# Patient Record
Sex: Female | Born: 1935 | Race: Black or African American | Hispanic: No | Smoking: Former smoker
Health system: Southern US, Community
[De-identification: ages and names within clinical notes are randomized; demographics above are authoritative.]

## PROBLEM LIST (undated history)

## (undated) DIAGNOSIS — E785 Hyperlipidemia, unspecified: Secondary | ICD-10-CM

## (undated) DIAGNOSIS — Z8719 Personal history of other diseases of the digestive system: Secondary | ICD-10-CM

## (undated) DIAGNOSIS — R799 Abnormal finding of blood chemistry, unspecified: Secondary | ICD-10-CM

## (undated) DIAGNOSIS — I639 Cerebral infarction, unspecified: Secondary | ICD-10-CM

## (undated) DIAGNOSIS — I10 Essential (primary) hypertension: Secondary | ICD-10-CM

## (undated) DIAGNOSIS — R739 Hyperglycemia, unspecified: Secondary | ICD-10-CM

## (undated) HISTORY — DX: Essential (primary) hypertension: I10

## (undated) HISTORY — DX: Hyperlipidemia, unspecified: E78.5

## (undated) HISTORY — PX: ABDOMINAL HYSTERECTOMY: SHX81

## (undated) HISTORY — DX: Abnormal finding of blood chemistry, unspecified: R79.9

## (undated) HISTORY — DX: Cerebral infarction, unspecified: I63.9

## (undated) HISTORY — DX: Personal history of other diseases of the digestive system: Z87.19

## (undated) HISTORY — PX: APPENDECTOMY: SHX54

## (undated) HISTORY — DX: Hyperglycemia, unspecified: R73.9

---

## 1991-04-26 HISTORY — PX: CYSTECTOMY: SUR359

## 1995-04-26 HISTORY — PX: CYSTECTOMY: SUR359

## 1999-04-16 ENCOUNTER — Encounter: Payer: Self-pay | Admitting: Family Medicine

## 1999-04-16 ENCOUNTER — Encounter: Admission: RE | Admit: 1999-04-16 | Discharge: 1999-04-16 | Payer: Self-pay | Admitting: Family Medicine

## 2000-04-24 ENCOUNTER — Encounter: Payer: Self-pay | Admitting: Family Medicine

## 2000-04-24 ENCOUNTER — Encounter: Admission: RE | Admit: 2000-04-24 | Discharge: 2000-04-24 | Payer: Self-pay | Admitting: Family Medicine

## 2000-06-13 ENCOUNTER — Ambulatory Visit (HOSPITAL_COMMUNITY): Admission: RE | Admit: 2000-06-13 | Discharge: 2000-06-13 | Payer: Self-pay | Admitting: Gastroenterology

## 2001-04-27 ENCOUNTER — Encounter: Admission: RE | Admit: 2001-04-27 | Discharge: 2001-04-27 | Payer: Self-pay | Admitting: Family Medicine

## 2001-04-27 ENCOUNTER — Encounter: Payer: Self-pay | Admitting: Family Medicine

## 2002-04-29 ENCOUNTER — Encounter: Payer: Self-pay | Admitting: Family Medicine

## 2002-04-29 ENCOUNTER — Encounter: Admission: RE | Admit: 2002-04-29 | Discharge: 2002-04-29 | Payer: Self-pay | Admitting: Family Medicine

## 2002-08-01 ENCOUNTER — Encounter: Admission: RE | Admit: 2002-08-01 | Discharge: 2002-08-01 | Payer: Self-pay | Admitting: Internal Medicine

## 2002-08-01 ENCOUNTER — Encounter: Payer: Self-pay | Admitting: Internal Medicine

## 2002-09-30 ENCOUNTER — Ambulatory Visit (HOSPITAL_COMMUNITY): Admission: RE | Admit: 2002-09-30 | Discharge: 2002-09-30 | Payer: Self-pay | Admitting: Gastroenterology

## 2003-05-02 ENCOUNTER — Encounter: Admission: RE | Admit: 2003-05-02 | Discharge: 2003-05-02 | Payer: Self-pay | Admitting: Internal Medicine

## 2003-10-18 HISTORY — PX: OTHER SURGICAL HISTORY: SHX169

## 2003-10-21 ENCOUNTER — Encounter: Admission: RE | Admit: 2003-10-21 | Discharge: 2003-10-21 | Payer: Self-pay | Admitting: Otolaryngology

## 2004-04-08 ENCOUNTER — Encounter: Payer: Self-pay | Admitting: Family Medicine

## 2004-05-07 ENCOUNTER — Encounter: Admission: RE | Admit: 2004-05-07 | Discharge: 2004-05-07 | Payer: Self-pay | Admitting: Internal Medicine

## 2004-06-02 ENCOUNTER — Ambulatory Visit (HOSPITAL_COMMUNITY): Admission: RE | Admit: 2004-06-02 | Discharge: 2004-06-02 | Payer: Self-pay | Admitting: Gastroenterology

## 2004-07-21 ENCOUNTER — Encounter: Payer: Self-pay | Admitting: Family Medicine

## 2004-07-21 HISTORY — PX: CARDIOVASCULAR STRESS TEST: SHX262

## 2005-05-17 ENCOUNTER — Encounter: Admission: RE | Admit: 2005-05-17 | Discharge: 2005-05-17 | Payer: Self-pay | Admitting: Internal Medicine

## 2005-08-01 ENCOUNTER — Encounter: Admission: RE | Admit: 2005-08-01 | Discharge: 2005-08-01 | Payer: Self-pay | Admitting: Internal Medicine

## 2006-02-17 ENCOUNTER — Ambulatory Visit: Payer: Self-pay | Admitting: Internal Medicine

## 2006-03-21 ENCOUNTER — Ambulatory Visit: Payer: Self-pay | Admitting: Internal Medicine

## 2006-03-21 ENCOUNTER — Encounter (INDEPENDENT_AMBULATORY_CARE_PROVIDER_SITE_OTHER): Payer: Self-pay | Admitting: *Deleted

## 2006-03-21 LAB — HM COLONOSCOPY: HM Colonoscopy: ABNORMAL

## 2006-03-22 ENCOUNTER — Ambulatory Visit (HOSPITAL_COMMUNITY): Admission: RE | Admit: 2006-03-22 | Discharge: 2006-03-22 | Payer: Self-pay | Admitting: Internal Medicine

## 2006-04-03 ENCOUNTER — Ambulatory Visit (HOSPITAL_COMMUNITY): Admission: RE | Admit: 2006-04-03 | Discharge: 2006-04-03 | Payer: Self-pay | Admitting: Internal Medicine

## 2006-05-17 ENCOUNTER — Encounter: Payer: Self-pay | Admitting: Family Medicine

## 2006-05-18 ENCOUNTER — Encounter: Admission: RE | Admit: 2006-05-18 | Discharge: 2006-05-18 | Payer: Self-pay | Admitting: Internal Medicine

## 2006-06-15 ENCOUNTER — Ambulatory Visit: Payer: Self-pay | Admitting: Internal Medicine

## 2006-09-08 ENCOUNTER — Ambulatory Visit: Payer: Self-pay | Admitting: Family Medicine

## 2006-09-08 DIAGNOSIS — E785 Hyperlipidemia, unspecified: Secondary | ICD-10-CM

## 2006-09-08 DIAGNOSIS — I10 Essential (primary) hypertension: Secondary | ICD-10-CM | POA: Insufficient documentation

## 2006-09-08 DIAGNOSIS — Z8719 Personal history of other diseases of the digestive system: Secondary | ICD-10-CM | POA: Insufficient documentation

## 2006-09-08 DIAGNOSIS — J329 Chronic sinusitis, unspecified: Secondary | ICD-10-CM | POA: Insufficient documentation

## 2006-09-13 LAB — CONVERTED CEMR LAB
ALT: 13 units/L (ref 0–40)
AST: 15 units/L (ref 0–37)
HDL: 36.4 mg/dL — ABNORMAL LOW (ref >39.0)
Total CHOL/HDL Ratio: 6.3
Triglycerides: 181 mg/dL — ABNORMAL HIGH (ref 0–149)

## 2006-09-15 ENCOUNTER — Encounter: Payer: Self-pay | Admitting: Family Medicine

## 2006-11-10 ENCOUNTER — Encounter: Admission: RE | Admit: 2006-11-10 | Discharge: 2006-11-10 | Payer: Self-pay | Admitting: Family Medicine

## 2006-11-10 ENCOUNTER — Ambulatory Visit: Payer: Self-pay | Admitting: Family Medicine

## 2006-12-08 ENCOUNTER — Ambulatory Visit: Payer: Self-pay | Admitting: Family Medicine

## 2007-01-05 ENCOUNTER — Ambulatory Visit: Payer: Self-pay | Admitting: Family Medicine

## 2007-01-08 LAB — CONVERTED CEMR LAB
Albumin: 3.8 g/dL (ref 3.5–5.2)
Calcium: 9.1 mg/dL (ref 8.4–10.5)
Chloride: 109 meq/L (ref 96–112)
GFR calc Af Amer: 57 mL/min
GFR calc non Af Amer: 47 mL/min
LDL Cholesterol: 89 mg/dL (ref 0–99)
Potassium: 3.6 meq/L (ref 3.5–5.1)
Sodium: 145 meq/L (ref 135–145)
Triglycerides: 108 mg/dL (ref 0–149)
VLDL: 22 mg/dL (ref 0–40)

## 2007-02-27 ENCOUNTER — Encounter: Payer: Self-pay | Admitting: Family Medicine

## 2007-04-26 DIAGNOSIS — R739 Hyperglycemia, unspecified: Secondary | ICD-10-CM

## 2007-04-26 HISTORY — DX: Hyperglycemia, unspecified: R73.9

## 2007-05-21 ENCOUNTER — Encounter: Admission: RE | Admit: 2007-05-21 | Discharge: 2007-05-21 | Payer: Self-pay | Admitting: Family Medicine

## 2007-05-24 ENCOUNTER — Encounter: Payer: Self-pay | Admitting: Family Medicine

## 2007-06-12 ENCOUNTER — Ambulatory Visit: Payer: Self-pay | Admitting: Family Medicine

## 2007-06-18 LAB — CONVERTED CEMR LAB
ALT: 17 units/L (ref 0–35)
AST: 18 units/L (ref 0–37)
Albumin: 3.7 g/dL (ref 3.5–5.2)
BUN: 15 mg/dL (ref 6–23)
CO2: 30 meq/L (ref 19–32)
Calcium: 9.4 mg/dL (ref 8.4–10.5)
Cholesterol: 159 mg/dL (ref 0–200)
GFR calc Af Amer: 52 mL/min
GFR calc non Af Amer: 43 mL/min
LDL Cholesterol: 101 mg/dL — ABNORMAL HIGH (ref 0–99)
Phosphorus: 3.1 mg/dL (ref 2.3–4.6)
Potassium: 4.1 meq/L (ref 3.5–5.1)
Total CHOL/HDL Ratio: 5.1

## 2007-07-24 ENCOUNTER — Ambulatory Visit: Payer: Self-pay | Admitting: Family Medicine

## 2007-09-10 ENCOUNTER — Ambulatory Visit: Payer: Self-pay | Admitting: Family Medicine

## 2007-09-10 DIAGNOSIS — J309 Allergic rhinitis, unspecified: Secondary | ICD-10-CM | POA: Insufficient documentation

## 2007-10-22 ENCOUNTER — Ambulatory Visit: Payer: Self-pay | Admitting: Family Medicine

## 2007-10-27 LAB — CONVERTED CEMR LAB
AST: 23 units/L (ref 0–37)
CO2: 31 meq/L (ref 19–32)
Chloride: 106 meq/L (ref 96–112)
GFR calc Af Amer: 52 mL/min
GFR calc non Af Amer: 43 mL/min
Glucose, Bld: 110 mg/dL — ABNORMAL HIGH (ref 70–99)
Potassium: 3.7 meq/L (ref 3.5–5.1)
Sodium: 142 meq/L (ref 135–145)
Total CHOL/HDL Ratio: 4.4

## 2007-10-31 ENCOUNTER — Ambulatory Visit: Payer: Self-pay | Admitting: Family Medicine

## 2007-10-31 DIAGNOSIS — R7309 Other abnormal glucose: Secondary | ICD-10-CM | POA: Insufficient documentation

## 2007-11-21 ENCOUNTER — Encounter: Payer: Self-pay | Admitting: Family Medicine

## 2008-04-02 ENCOUNTER — Ambulatory Visit: Payer: Self-pay | Admitting: Family Medicine

## 2008-04-02 ENCOUNTER — Encounter (INDEPENDENT_AMBULATORY_CARE_PROVIDER_SITE_OTHER): Payer: Self-pay | Admitting: Internal Medicine

## 2008-04-03 ENCOUNTER — Ambulatory Visit: Payer: Self-pay | Admitting: Family Medicine

## 2008-04-04 ENCOUNTER — Telehealth (INDEPENDENT_AMBULATORY_CARE_PROVIDER_SITE_OTHER): Payer: Self-pay | Admitting: Internal Medicine

## 2008-04-15 ENCOUNTER — Telehealth: Payer: Self-pay | Admitting: Family Medicine

## 2008-05-07 ENCOUNTER — Ambulatory Visit: Payer: Self-pay | Admitting: Family Medicine

## 2008-05-08 LAB — CONVERTED CEMR LAB
AST: 21 units/L (ref 0–37)
Albumin: 3.7 g/dL (ref 3.5–5.2)
Alkaline Phosphatase: 58 units/L (ref 39–117)
BUN: 16 mg/dL (ref 6–23)
Bilirubin, Direct: 0.1 mg/dL (ref 0.0–0.3)
CO2: 30 meq/L (ref 19–32)
Calcium: 9.4 mg/dL (ref 8.4–10.5)
Chloride: 106 meq/L (ref 96–112)
Cholesterol: 148 mg/dL (ref 0–200)
Creatinine, Ser: 1.3 mg/dL — ABNORMAL HIGH (ref 0.4–1.2)
GFR calc Af Amer: 52 mL/min
GFR calc non Af Amer: 43 mL/min
LDL Cholesterol: 91 mg/dL (ref 0–99)
Total Protein: 6.8 g/dL (ref 6.0–8.3)
Triglycerides: 130 mg/dL (ref 0–149)

## 2008-05-12 ENCOUNTER — Ambulatory Visit: Payer: Self-pay | Admitting: Family Medicine

## 2008-05-21 ENCOUNTER — Encounter: Admission: RE | Admit: 2008-05-21 | Discharge: 2008-05-21 | Payer: Self-pay | Admitting: Family Medicine

## 2008-05-23 ENCOUNTER — Encounter (INDEPENDENT_AMBULATORY_CARE_PROVIDER_SITE_OTHER): Payer: Self-pay | Admitting: *Deleted

## 2008-06-17 ENCOUNTER — Encounter: Payer: Self-pay | Admitting: Family Medicine

## 2008-11-10 ENCOUNTER — Ambulatory Visit: Payer: Self-pay | Admitting: Family Medicine

## 2008-11-10 LAB — CONVERTED CEMR LAB: HDL goal, serum: 40 mg/dL

## 2008-11-11 LAB — CONVERTED CEMR LAB
Albumin: 3.6 g/dL (ref 3.5–5.2)
Calcium: 9.4 mg/dL (ref 8.4–10.5)
Cholesterol: 178 mg/dL (ref 0–200)
Creatinine, Ser: 1.6 mg/dL — ABNORMAL HIGH (ref 0.4–1.2)
Glucose, Bld: 91 mg/dL (ref 70–99)
HDL: 36.3 mg/dL — ABNORMAL LOW (ref >39.00)
LDL Cholesterol: 116 mg/dL — ABNORMAL HIGH (ref 0–99)
Phosphorus: 3.2 mg/dL (ref 2.3–4.6)
VLDL: 25.8 mg/dL (ref 0.0–40.0)

## 2008-11-26 ENCOUNTER — Ambulatory Visit: Payer: Self-pay | Admitting: Family Medicine

## 2008-11-26 DIAGNOSIS — N289 Disorder of kidney and ureter, unspecified: Secondary | ICD-10-CM | POA: Insufficient documentation

## 2008-11-26 LAB — CONVERTED CEMR LAB
Bacteria, UA: 0
Blood in Urine, dipstick: NEGATIVE
Casts: 0 /lpf
Nitrite: NEGATIVE
Protein, U semiquant: NEGATIVE
Urine crystals, microscopic: 0 /hpf
Urobilinogen, UA: 0.2

## 2008-12-01 LAB — CONVERTED CEMR LAB
Albumin: 3.5 g/dL (ref 3.5–5.2)
BUN: 13 mg/dL (ref 6–23)
Chloride: 110 meq/L (ref 96–112)
Creatinine, Ser: 1.4 mg/dL — ABNORMAL HIGH (ref 0.4–1.2)
Phosphorus: 3.1 mg/dL (ref 2.3–4.6)

## 2008-12-15 ENCOUNTER — Ambulatory Visit: Payer: Self-pay | Admitting: Family Medicine

## 2009-03-17 ENCOUNTER — Ambulatory Visit: Payer: Self-pay | Admitting: Family Medicine

## 2009-03-18 LAB — CONVERTED CEMR LAB
Albumin: 3.8 g/dL (ref 3.5–5.2)
BUN: 10 mg/dL (ref 6–23)
Calcium: 9 mg/dL (ref 8.4–10.5)
Glucose, Bld: 103 mg/dL — ABNORMAL HIGH (ref 70–99)
HDL: 32.2 mg/dL — ABNORMAL LOW (ref >39.00)
LDL Cholesterol: 117 mg/dL — ABNORMAL HIGH (ref 0–99)
Potassium: 4.1 meq/L (ref 3.5–5.1)
Total CHOL/HDL Ratio: 5
Triglycerides: 129 mg/dL (ref 0.0–149.0)

## 2009-05-22 ENCOUNTER — Encounter: Admission: RE | Admit: 2009-05-22 | Discharge: 2009-05-22 | Payer: Self-pay | Admitting: Family Medicine

## 2009-05-26 ENCOUNTER — Encounter (INDEPENDENT_AMBULATORY_CARE_PROVIDER_SITE_OTHER): Payer: Self-pay | Admitting: *Deleted

## 2009-08-02 ENCOUNTER — Emergency Department (HOSPITAL_COMMUNITY): Admission: EM | Admit: 2009-08-02 | Discharge: 2009-08-02 | Payer: Self-pay | Admitting: Emergency Medicine

## 2009-08-04 ENCOUNTER — Ambulatory Visit: Payer: Self-pay | Admitting: Family Medicine

## 2009-09-14 ENCOUNTER — Ambulatory Visit: Payer: Self-pay | Admitting: Family Medicine

## 2009-09-14 LAB — CONVERTED CEMR LAB
AST: 19 units/L (ref 0–37)
Chloride: 108 meq/L (ref 96–112)
Cholesterol: 176 mg/dL (ref 0–200)
GFR calc non Af Amer: 57.27 mL/min (ref >60)
LDL Cholesterol: 109 mg/dL — ABNORMAL HIGH (ref 0–99)
Phosphorus: 3.1 mg/dL (ref 2.3–4.6)
Potassium: 4.4 meq/L (ref 3.5–5.1)
Total CHOL/HDL Ratio: 5

## 2009-09-17 ENCOUNTER — Ambulatory Visit: Payer: Self-pay | Admitting: Family Medicine

## 2009-11-18 ENCOUNTER — Ambulatory Visit: Payer: Self-pay | Admitting: Family Medicine

## 2010-01-11 ENCOUNTER — Telehealth: Payer: Self-pay | Admitting: Family Medicine

## 2010-03-01 ENCOUNTER — Encounter: Payer: Self-pay | Admitting: Family Medicine

## 2010-05-17 ENCOUNTER — Ambulatory Visit
Admission: RE | Admit: 2010-05-17 | Discharge: 2010-05-17 | Payer: Self-pay | Source: Home / Self Care | Attending: Family Medicine | Admitting: Family Medicine

## 2010-05-17 ENCOUNTER — Other Ambulatory Visit: Payer: Self-pay | Admitting: Family Medicine

## 2010-05-17 LAB — CBC WITH DIFFERENTIAL/PLATELET
Basophils Relative: 0.6 % (ref 0.0–3.0)
Eosinophils Relative: 11.3 % — ABNORMAL HIGH (ref 0.0–5.0)
Lymphocytes Relative: 51.2 % — ABNORMAL HIGH (ref 12.0–46.0)
Monocytes Relative: 6.9 % (ref 3.0–12.0)
Neutrophils Relative %: 30 % — ABNORMAL LOW (ref 43.0–77.0)
RBC: 4.4 Mil/uL (ref 3.87–5.11)
WBC: 6.2 10*3/uL (ref 4.5–10.5)

## 2010-05-17 LAB — RENAL FUNCTION PANEL
Calcium: 9.2 mg/dL (ref 8.4–10.5)
GFR: 55.02 mL/min — ABNORMAL LOW (ref >60.00)
Glucose, Bld: 97 mg/dL (ref 70–99)
Phosphorus: 3.1 mg/dL (ref 2.3–4.6)
Potassium: 4.1 mEq/L (ref 3.5–5.1)
Sodium: 140 mEq/L (ref 135–145)

## 2010-05-17 LAB — LIPID PANEL
Cholesterol: 148 mg/dL (ref 0–200)
LDL Cholesterol: 88 mg/dL (ref 0–99)
Triglycerides: 143 mg/dL (ref 0.0–149.0)
VLDL: 28.6 mg/dL (ref 0.0–40.0)

## 2010-05-24 ENCOUNTER — Ambulatory Visit
Admission: RE | Admit: 2010-05-24 | Discharge: 2010-05-24 | Payer: Self-pay | Source: Home / Self Care | Attending: Family Medicine | Admitting: Family Medicine

## 2010-05-24 ENCOUNTER — Encounter
Admission: RE | Admit: 2010-05-24 | Discharge: 2010-05-24 | Payer: Self-pay | Source: Home / Self Care | Attending: Family Medicine | Admitting: Family Medicine

## 2010-05-24 LAB — HM MAMMOGRAPHY: HM Mammogram: NORMAL

## 2010-05-25 ENCOUNTER — Encounter (INDEPENDENT_AMBULATORY_CARE_PROVIDER_SITE_OTHER): Payer: Self-pay | Admitting: *Deleted

## 2010-05-27 NOTE — Miscellaneous (Signed)
Summary: Flu vaccine  Clinical Lists Changes  Observations: Added new observation of FLU VAX: Historical (02/23/2010 16:37)      Influenza Immunization History:    Influenza # 1:  Historical (02/23/2010)  Received form from [facility].

## 2010-05-27 NOTE — Letter (Signed)
Summary: Generic Letter  [place] at [facility]  [address] [place], [place] [address]   Phone: [phone]  Fax: [phone]    05/26/2009      [address] [place], [place]  [address]      Dear Ms. Marcon,   Your mammogram was normal, please repeat screening in one year.    Sincerely,   Daralene Milch CMA ([place])

## 2010-05-27 NOTE — Assessment & Plan Note (Signed)
Summary: lower back pain/alc   Vital Signs:  Patient profile:   76 year old female Height:      63 inches Weight:      174.50 pounds BMI:     31.02 Temp:     98 degrees F oral Pulse rate:   64 / minute Pulse rhythm:   regular BP sitting:   150 / 86  (left arm) Cuff size:   large  Vitals Entered By: Ozzie Hoyle LPN (April 12, 624THL QA348G AM) CC: lower back pain on rt side of back and pain goes down in rt leg sometimes. Pt seen ER at [facility] on 08/02/09,no testing done.   History of Present Illness: went to hosp on 10 th -- with severe R lower back pain  could not straighten up her back  is improved but not all the way   still sore  is doing hot compresses   has had back problems on and off for long time  usually pain just lasts one day  this time no lifting or trauma - just happened   pain is shooting down R leg just a little  no numbness or weakness   ibuprofen 600 is helpin  Allergies: 1)  ! Crestor (Rosuvastatin Calcium) 2)  ! * Lovastatin 3)  ! * Flonase 4)  ! Claritin  Past History:  Past Medical History: Last updated: 05/12/2008 Diverticulitis, hx of GERD Hyperlipidemia Hypertension all rhinitis mild hyperglycemia 09 baseline cr 1.3- watching for renal insufficency   Past Surgical History: Last updated: 09/08/2006 cyst R axilla 93 cyst R breast 97 appy hyst stress test was 07/21/04 barium swallow 10/18/03  Family History: Last updated: 09/08/2006 mother had arthritis sister with arthritis, possibly lupus, and DM brother with prostate ca father died with CVA, ? htn  Social History: Last updated: 10/31/2007 is widowed, and not working has a son close by walking videos for exercise 3-4 times weekly non smoker  Risk Factors: Smoking Status: never (11/10/2008)  Review of Systems General:  Denies chills, fatigue, fever, loss of appetite, and malaise. CV:  Denies chest pain or discomfort and palpitations. Resp:  Denies cough and  shortness of breath. GI:  Denies abdominal pain and change in bowel habits. GU:  Denies dysuria, hematuria, and urinary frequency. MS:  Complains of low back pain and stiffness; denies joint redness, joint swelling, and muscle weakness. Derm:  Denies poor wound healing and rash. Neuro:  Denies numbness, tingling, and weakness.  Physical Exam  General:  overweight but generally well appearing  Head:  normocephalic, atraumatic, and no abnormalities observed.   Eyes:  vision grossly intact, pupils equal, pupils round, and pupils reactive to light.   Neck:  nl rom , no bony tenderness  Lungs:  Normal respiratory effort, chest expands symmetrically. Lungs are clear to auscultation, no crackles or wheezes.  Heart:  Normal rate and regular rhythm. S1 and S2 normal without gallop, murmur, click, rub or other extra sounds. Msk:  no scoliosis or lordosis  tender R lumbar musculature and SI area no bony tenderness nl slr nl rom hips  LS flex- full/ ext 10 deg with pain and some pain on R lateral flex  Extremities:  No clubbing, cyanosis, edema, or deformity noted with normal full range of motion of all joints.   Neurologic:  strength normal in all extremities, sensation intact to light touch, gait normal, and DTRs symmetrical and normal.   Skin:  Intact without suspicious lesions or rashes Cervical Nodes:  No lymphadenopathy noted Inguinal Nodes:  No significant adenopathy Psych:  normal affect, talkative and pleasant    Impression & Recommendations:  Problem # 1:  BACK PAIN (ICD-724.5) Assessment New muscular sprain/ strain perilumbar and SI will take mobic one week and update (short term due to renal insuff)  heat/ stretches/ walking update if worse or neurol symptoms consider x ray if not imp  The following medications were removed from the medication list:    Ibuprofen 600 Mg Tabs (Ibuprofen) .Marland Kitchen... Take one tablet by mouth  three times a day Her updated medication list for this  problem includes:    Mobic 7.5 Mg Tabs (Meloxicam) .Marland Kitchen... 1 by mouth once daily with food for back pain for 1 week  Complete Medication List: 1)  Zocor 20 Mg Tabs (Simvastatin) .Marland Kitchen.. 1 by mouth q evening with a low fat snack 2)  Mobic 7.5 Mg Tabs (Meloxicam) .Marland Kitchen.. 1 by mouth once daily with food for back pain for 1 week  Patient Instructions: 1)  stop the ibuprofen and try mobic for 1 week with food (stop if GI upset ) -- no more than one week because it can also be hard on kidney 2)  continue heat on back  3)  if not further improved- call, and I will set up some x rays  4)  update me if worse or any numbness or weakness  Prescriptions: MOBIC 7.5 MG TABS (MELOXICAM) 1 by mouth once daily with food for back pain for 1 week  #7 x 0   Entered and Authorized by:   Allena Earing MD   Signed by:   Allena Earing MD on 08/04/2009   Method used:   Electronically to        [facility] (retail)       [place],   [address]       Ph: [phone]       Fax: [phone]   RxID:   [id number]   Current Allergies (reviewed today): ! CRESTOR (ROSUVASTATIN CALCIUM) ! * LOVASTATIN ! * FLONASE ! CLARITIN

## 2010-05-27 NOTE — Assessment & Plan Note (Signed)
Summary: 2 MONTH FOLLOW UP/[facility]   Vital Signs:  Patient profile:   76 year old female Height:      63 inches Weight:      167 pounds BMI:     29.69 Temp:     97.7 degrees F oral Pulse rate:   80 / minute Pulse rhythm:   regular BP sitting:   126 / 84  (left arm) Cuff size:   large  Vitals Entered By: Ozzie Hoyle LPN (July 27, 624THL QA348G AM) CC: 2 month f/u   History of Present Illness: here for f/u of HTN   last visit bp was up  put her on norvasc 5 mg today much bette rat 126/84  wt is down 2 lb  is not having any trouble with her medicines   a lot of trouble sleeping at night  no emotional problems  getting some exercise during the day -- does 20 minutes every night  ? could move that to a different time of the day   no caffiene - very seldom if any   Allergies: 1)  ! Crestor (Rosuvastatin Calcium) 2)  ! * Lovastatin 3)  ! * Flonase 4)  ! Claritin  Past History:  Past Medical History: Last updated: 05/12/2008 Diverticulitis, hx of GERD Hyperlipidemia Hypertension all rhinitis mild hyperglycemia 09 baseline cr 1.3- watching for renal insufficency   Past Surgical History: Last updated: 09/08/2006 cyst R axilla 93 cyst R breast 97 appy hyst stress test was 07/21/04 barium swallow 10/18/03  Family History: Last updated: 09/08/2006 mother had arthritis sister with arthritis, possibly lupus, and DM brother with prostate ca father died with CVA, ? htn  Social History: Last updated: 10/31/2007 is widowed, and not working has a son close by walking videos for exercise 3-4 times weekly non smoker  Risk Factors: Smoking Status: never (11/10/2008)  Review of Systems General:  Denies fatigue, loss of appetite, and malaise. Eyes:  Denies blurring and eye irritation. CV:  Denies chest pain or discomfort, lightheadness, palpitations, and shortness of breath with exertion. Resp:  Denies cough, shortness of breath, and wheezing. GI:  Denies abdominal  pain, change in bowel habits, indigestion, and nausea. MS:  Denies muscle aches, cramps, and muscle weakness. Derm:  Denies lesion(s), poor wound healing, and rash. Neuro:  Denies numbness and tingling. Endo:  Denies excessive thirst and excessive urination. Heme:  Denies abnormal bruising and bleeding.  Physical Exam  General:  overweight but generally well appearing  Head:  normocephalic, atraumatic, and no abnormalities observed.   Eyes:  vision grossly intact, pupils equal, pupils round, and pupils reactive to light.   Mouth:  pharynx pink and moist.   Neck:  supple with full rom and no masses or thyromegally, no JVD or carotid bruit  Lungs:  Normal respiratory effort, chest expands symmetrically. Lungs are clear to auscultation, no crackles or wheezes.  Heart:  Normal rate and regular rhythm. S1 and S2 normal without gallop, murmur, click, rub or other extra sounds. Pulses:  R and L carotid,radial,femoral,dorsalis pedis and posterior tibial pulses are full and equal bilaterally Extremities:  No clubbing, cyanosis, edema, or deformity noted with normal full range of motion of all joints.   Neurologic:  sensation intact to light touch, gait normal, and DTRs symmetrical and normal.   Skin:  Intact without suspicious lesions or rashes Cervical Nodes:  No lymphadenopathy noted Psych:  normal affect, talkative and pleasant    Impression & Recommendations:  Problem # 1:  HYPERTENSION (  ICD-401.9) Assessment Improved  improved with norvasc and no side eff disc healthy diet (low simple sugar/ choose complex carbs/ low sat fat) diet and exercise in detail -- also avoiding sodium f/u after labs in 6 months  Her updated medication list for this problem includes:    Norvasc 5 Mg Tabs (Amlodipine besylate) .Marland Kitchen... 1 by mouth once daily  BP today: 126/84 Prior BP: 144/90 (09/17/2009)  Prior 10 Yr Risk Heart Disease: 13 % (11/26/2008)  Labs Reviewed: K+: 4.4 (09/14/2009) Creat: : 1.2  (09/14/2009)   Chol: 176 (09/14/2009)   HDL: 35.30 (09/14/2009)   LDL: 109 (09/14/2009)   TG: 161.0 (09/14/2009)  Complete Medication List: 1)  Zocor 20 Mg Tabs (Simvastatin) .Marland Kitchen.. 1 by mouth q evening with a low fat snack 2)  Norvasc 5 Mg Tabs (Amlodipine besylate) .Marland Kitchen.. 1 by mouth once daily  Patient Instructions: 1)  schedule fasting labs and then f/u in 6 months 2)  renal, cbc with diff, tsh, lipid/ast/alt 272, 401.1 3)  keep up the good work with exercise but move it to earlier in the day so it does not affect sleep 4)  eat less salt  5)  eat less fat and sugar and smaller portions for weight loss 6)  blood pressure looks good today 7)  no change in medicine  Current Allergies (reviewed today): ! CRESTOR (ROSUVASTATIN CALCIUM) ! * LOVASTATIN ! * FLONASE ! CLARITIN

## 2010-05-27 NOTE — Progress Notes (Signed)
Summary: needs script for norvasc  Phone Note Refill Request Message from:  Fax from Pharmacy  Refills Requested: Medication #1:  NORVASC 5 MG TABS 1 by mouth once daily. [facility], they are requesting a new script for the pt.  Initial call taken by: Marty Heck CMA,  January 11, 2010 4:10 PM  Follow-up for Phone Call        form done and in nurse in box  Follow-up by: Allena Earing MD,  January 11, 2010 4:12 PM  Additional Follow-up for Phone Call Additional follow up Details #1::        Completed form faxed to [phone] as instructed. Ozzie Hoyle LPN  September 19, 624THL 4:49 PM     Prescriptions: NORVASC 5 MG TABS (AMLODIPINE BESYLATE) 1 by mouth once daily  #90 x 3   Entered and Authorized by:   Allena Earing MD   Signed by:   Ozzie Hoyle LPN on QA348G   Method used:   Historical   [id number]

## 2010-05-27 NOTE — Assessment & Plan Note (Signed)
Summary: 6 MONTH FOLLOW UP/[facility]   Vital Signs:  Patient profile:   76 year old female Height:      63 inches Weight:      169.25 pounds BMI:     30.09 Temp:     97.8 degrees F oral Pulse rate:   68 / minute Pulse rhythm:   regular BP sitting:   144 / 90  (right arm) Cuff size:   large  Vitals Entered By: Ozzie Hoyle LPN (May 26, 624THL QA348G AM)  Serial Vital Signs/Assessments:  Time      Position  BP       Pulse  Resp  Temp     By                     150/90                         Allena Earing MD  CC: six month f/u after labs   History of Present Illness: here for f/u of lipids and mild HTN and mild renal insuff has been feeling good overall  cr is 1.2 today- - now in nl range  first bp is 144/90 was high last time too  was on hctz in past -worked well   lipids imp with trig 161/ HDL 35 andLDL 109 (down from 117) on zocor - this works well   is walking and stretching at [place] 3 days per week  some days cannot with your back   Allergies: 1)  ! Crestor (Rosuvastatin Calcium) 2)  ! * Lovastatin 3)  ! * Flonase 4)  ! Claritin  Past History:  Past Medical History: Last updated: 05/12/2008 Diverticulitis, hx of GERD Hyperlipidemia Hypertension all rhinitis mild hyperglycemia 09 baseline cr 1.3- watching for renal insufficency   Past Surgical History: Last updated: 09/08/2006 cyst R axilla 93 cyst R breast 97 appy hyst stress test was 07/21/04 barium swallow 10/18/03  Family History: Last updated: 09/08/2006 mother had arthritis sister with arthritis, possibly lupus, and DM brother with prostate ca father died with CVA, ? htn  Social History: Last updated: 10/31/2007 is widowed, and not working has a son close by walking videos for exercise 3-4 times weekly non smoker  Risk Factors: Smoking Status: never (11/10/2008)  Review of Systems General:  Denies chills, fatigue, fever, loss of appetite, and malaise. Eyes:  Denies blurring and eye  irritation. CV:  Denies chest pain or discomfort, lightheadness, palpitations, and shortness of breath with exertion. Resp:  Denies cough, shortness of breath, and wheezing. GI:  Denies abdominal pain, change in bowel habits, indigestion, and nausea. MS:  Complains of low back pain; denies joint pain, joint redness, and joint swelling. Derm:  Denies itching, lesion(s), poor wound healing, and rash. Neuro:  Denies numbness and tingling. Psych:  Denies anxiety and depression. Endo:  Denies cold intolerance, excessive thirst, excessive urination, and heat intolerance.  Physical Exam  General:  overweight but generally well appearing  Head:  normocephalic, atraumatic, and no abnormalities observed.   Eyes:  vision grossly intact, pupils equal, pupils round, and pupils reactive to light.   Mouth:  pharynx pink and moist.   Neck:  supple with full rom and no masses or thyromegally, no JVD or carotid bruit  Chest Wall:  No deformities, masses, or tenderness noted. Lungs:  Normal respiratory effort, chest expands symmetrically. Lungs are clear to auscultation, no crackles or wheezes.  Heart:  Normal rate  and regular rhythm. S1 and S2 normal without gallop, murmur, click, rub or other extra sounds. Abdomen:  no renal bruits  Msk:  No deformity or scoliosis noted of thoracic or lumbar spine.   Pulses:  R and L carotid,radial,femoral,dorsalis pedis and posterior tibial pulses are full and equal bilaterally Extremities:  No clubbing, cyanosis, edema, or deformity noted with normal full range of motion of all joints.   Neurologic:  sensation intact to light touch, gait normal, and DTRs symmetrical and normal.   Skin:  Intact without suspicious lesions or rashes Cervical Nodes:  No lymphadenopathy noted Psych:  normal affect, talkative and pleasant    Impression & Recommendations:  Problem # 1:  HYPERTENSION (ICD-401.9) Assessment Deteriorated  bp too high - pt strongly resists med in past but  is open to it now  does not want hctz again (? reason )  will try norvasc and update  disc poss side eff and will update urged to keep up healthy diet and exercise f/u 2 mo  Her updated medication list for this problem includes:    Norvasc 5 Mg Tabs (Amlodipine besylate) .Marland Kitchen... 1 by mouth once daily  BP today: 144/90 Prior BP: 150/86 (08/04/2009)  Prior 10 Yr Risk Heart Disease: 13 % (11/26/2008)  Labs Reviewed: K+: 4.4 (09/14/2009) Creat: : 1.2 (09/14/2009)   Chol: 176 (09/14/2009)   HDL: 35.30 (09/14/2009)   LDL: 109 (09/14/2009)   TG: 161.0 (09/14/2009)  Orders: Prescription Created Electronically [phone])  Problem # 2:  HYPERLIPIDEMIA (ICD-272.4) Assessment: Improved  this is slt improved rev low sat fat diet  exercise to inc hdl continue zocor  Her updated medication list for this problem includes:    Zocor 20 Mg Tabs (Simvastatin) .Marland Kitchen... 1 by mouth q evening with a low fat snack  Labs Reviewed: SGOT: 19 (09/14/2009)   SGPT: 16 (09/14/2009)  Lipid Goals: Chol Goal: 200 (11/10/2008)   HDL Goal: 40 (11/10/2008)   LDL Goal: 130 (11/10/2008)   TG Goal: 150 (11/10/2008)  Prior 10 Yr Risk Heart Disease: 13 % (11/26/2008)   HDL:35.30 (09/14/2009), 32.20 (03/17/2009)  LDL:109 (09/14/2009), 117 (03/17/2009)  Chol:176 (09/14/2009), 175 (03/17/2009)  Trig:161.0 (09/14/2009), 129.0 (03/17/2009)  Orders: Prescription Created Electronically [phone])  Complete Medication List: 1)  Zocor 20 Mg Tabs (Simvastatin) .Marland Kitchen.. 1 by mouth q evening with a low fat snack 2)  Norvasc 5 Mg Tabs (Amlodipine besylate) .Marland Kitchen.. 1 by mouth once daily  Patient Instructions: 1)  start norvasc (amlodipine) 1 pill each am  2)  if any side effects or problems let me know  3)  if bp goes lower than 90/50 let me know  4)  continue good diet and exercise  5)  labs look good today 6)  follow up with me in 2 months  Prescriptions: ZOCOR 20 MG  TABS (SIMVASTATIN) 1 by mouth q evening with a low fat snack  #90  x 3   Entered and Authorized by:   Allena Earing MD   Signed by:   Allena Earing MD on 09/17/2009   Method used:   Print then Give to Patient   RxID:   [id number] NORVASC 5 MG TABS (AMLODIPINE BESYLATE) 1 by mouth once daily  #30 x 11   Entered and Authorized by:   Allena Earing MD   Signed by:   Allena Earing MD on 09/17/2009   Method used:   Electronically to        [facility] (retail)       [place], [place]  [address]       Ph: [phone]       Fax: [phone]   RxIDUW:[id number]   Current Allergies (reviewed today): ! CRESTOR (ROSUVASTATIN CALCIUM) ! * LOVASTATIN ! * FLONASE ! CLARITIN

## 2010-06-02 NOTE — Letter (Signed)
Summary: Results Follow up Letter  [name] at [facility]  [address] [place], [place] [address]   Phone: [phone]  Fax: [phone]    05/25/2010 MRN: [id number]  [address] [place], [place]  [address]  Dear [name],  The following are the results of your recent test(s):  Test         Result    Pap Smear:        Normal _____  Not Normal _____ Comments: ______________________________________________________ Cholesterol: LDL(Bad cholesterol):         Your goal is less than:         HDL (Good cholesterol):       Your goal is more than: Comments:  ______________________________________________________ Mammogram:        Normal __X___  Not Normal _____ Comments: Repeat in 1 year  ___________________________________________________________________ Hemoccult:        Normal _____  Not normal _______ Comments:    _____________________________________________________________________ Other Tests:    We routinely do not discuss normal results over the telephone.  If you desire a copy of the results, or you have any questions about this information we can discuss them at your next office visit.   Sincerely,     Sheldon Silvan for Dr. Loura Pardon

## 2010-06-02 NOTE — Assessment & Plan Note (Signed)
Summary: 6 MONTH FOLLOWUP/RBH   Vital Signs:  Patient profile:   76 year old female Height:      63 inches Weight:      169 pounds BMI:     30.05 Temp:     98 degrees F 76oral Pulse rate:   76 / minute Pulse rhythm:   regular BP sitting:   128 / 80  (left arm) Cuff size:   large  Vitals Entered By: Ozzie Hoyle LPN (January 30, [id number] 8:10 AM) CC: six month f/u   History of Present Illness: here for f/u of HTN and lipids and renal insuff and mild hyperglycemia  has been doing well   wt is up 2 lb with bmi of 30 she thinks her wt is about the same   HTN 128/80  renal insuff better with cr 1.2   sugar fasting 97   lipids are imp on zocor with trig 143 and HDL 31 and LDL 88  has been eating better overall -- avoiding sweets and fats- cut down a whole lot  also some exercise -- walking at the mall      Allergies: 1)  ! Crestor (Rosuvastatin Calcium) 2)  ! * Lovastatin 3)  ! * Flonase 4)  ! Claritin  Past History:  Past Medical History: Last updated: 05/12/2008 Diverticulitis, hx of GERD Hyperlipidemia Hypertension all rhinitis mild hyperglycemia 09 baseline cr 1.3- watching for renal insufficency   Past Surgical History: Last updated: 09/08/2006 cyst R axilla 93 cyst R breast 97 appy hyst stress test was 07/21/04 barium swallow 10/18/03  Family History: Last updated: 09/08/2006 mother had arthritis sister with arthritis, possibly lupus, and DM brother with prostate ca father died with CVA, ? htn  Social History: Last updated: 10/31/2007 is widowed, and not working has a son close by walking videos for exercise 3-4 times weekly non smoker  Risk Factors: Smoking Status: never (11/10/2008)  Review of Systems General:  Denies fatigue, fever, loss of appetite, and malaise. Eyes:  Denies blurring and eye irritation. CV:  Denies chest pain or discomfort, lightheadness, and palpitations. Resp:  Denies cough, shortness of breath, and wheezing. GI:   Denies abdominal pain, bloody stools, and change in bowel habits. GU:  Denies dysuria and urinary frequency. MS:  Denies muscle aches, cramps, and muscle weakness. Derm:  Denies itching, lesion(s), poor wound healing, and rash. Neuro:  Denies numbness and tingling. Psych:  mood is ok . Endo:  Denies cold intolerance, excessive thirst, excessive urination, and heat intolerance. Heme:  Denies abnormal bruising and bleeding.  Physical Exam  General:  overweight but generally well appearing  Head:  normocephalic, atraumatic, and no abnormalities observed.   Eyes:  vision grossly intact, pupils equal, pupils round, and pupils reactive to light.   Mouth:  pharynx pink and moist.   Neck:  supple with full rom and no masses or thyromegally, no JVD or carotid bruit  Chest Wall:  No deformities, masses, or tenderness noted. Lungs:  Normal respiratory effort, chest expands symmetrically. Lungs are clear to auscultation, no crackles or wheezes.  Heart:  Normal rate and regular rhythm. S1 and S2 normal without gallop, murmur, click, rub or other extra sounds. Abdomen:  no renal bruits  soft, non-tender, and normal bowel sounds.   Msk:  No deformity or scoliosis noted of thoracic or lumbar spine.   Pulses:  R and L carotid,radial,femoral,dorsalis pedis and posterior tibial pulses are full and equal bilaterally Extremities:  No clubbing, cyanosis, edema, or deformity  noted with normal full range of motion of all joints.   Neurologic:  sensation intact to light touch, gait normal, and DTRs symmetrical and normal.   Skin:  Intact without suspicious lesions or rashes Cervical Nodes:  No lymphadenopathy noted Inguinal Nodes:  No significant adenopathy Psych:  normal affect, talkative and pleasant    Impression & Recommendations:  Problem # 1:  RENAL INSUFFICIENCY (ICD-588.9) Assessment Improved this is improved - good fluid intake cr 1.2  rev with pt   Problem # 2:  FASTING HYPERGLYCEMIA  (ICD-790.29) Assessment: Improved  better with low glycemic diet  commended on this and exercise she will work on weight loss   Labs Reviewed: Creat: 1.2 (05/17/2010)     Problem # 3:  HYPERTENSION (ICD-401.9) Assessment: Unchanged  bp in very good control with norvasc  will continue this and good habits  Her updated medication list for this problem includes:    Norvasc 5 Mg Tabs (Amlodipine besylate) .Marland Kitchen... 1 by mouth once daily  BP today: 128/80 Prior BP: 126/84 (11/18/2009)  Prior 10 Yr Risk Heart Disease: 13 % (11/26/2008)  Labs Reviewed: K+: 4.1 (05/17/2010) Creat: : 1.2 (05/17/2010)   Chol: 148 (05/17/2010)   HDL: 31.50 (05/17/2010)   LDL: 88 (05/17/2010)   TG: 143.0 (05/17/2010)  Problem # 4:  HYPERLIPIDEMIA (ICD-272.4) Assessment: Improved  better with imp diet and low dose simvastain keeping dose low in light of poss amlodipine interaction  no muscle sympotms  f/u 6 mo with labs  Her updated medication list for this problem includes:    Zocor 20 Mg Tabs (Simvastatin) .Marland Kitchen... 1 by mouth every  evening with a low fat snack  Labs Reviewed: SGOT: 18 (05/17/2010)   SGPT: 17 (05/17/2010)  Lipid Goals: Chol Goal: 200 (11/10/2008)   HDL Goal: 40 (11/10/2008)   LDL Goal: 130 (11/10/2008)   TG Goal: 150 (11/10/2008)  Prior 10 Yr Risk Heart Disease: 13 % (11/26/2008)   HDL:31.50 (05/17/2010), 35.30 (09/14/2009)  LDL:88 (05/17/2010), 109 (09/14/2009)  Chol:148 (05/17/2010), 176 (09/14/2009)  Trig:143.0 (05/17/2010), 161.0 (09/14/2009)  Complete Medication List: 1)  Zocor 20 Mg Tabs (Simvastatin) .Marland Kitchen.. 1 by mouth every  evening with a low fat snack 2)  Norvasc 5 Mg Tabs (Amlodipine besylate) .Marland Kitchen.. 1 by mouth once daily  Patient Instructions: 1)  keep up the good work with healthy diet and exercise 2)  schedule f/u for 30 min check up in 6 months with labs prior  3)  no change in medicines   Orders Added: 1)  Est. Patient Level III [id number]    Current Allergies  (reviewed today): ! CRESTOR (ROSUVASTATIN CALCIUM) ! * LOVASTATIN ! * FLONASE ! CLARITIN

## 2010-06-02 NOTE — Miscellaneous (Signed)
Summary: Mammogram to flowsheet  Clinical Lists Changes  Observations: Added new observation of MAMMO DUE: 05/2011 (05/25/2010 8:00) Added new observation of MAMMOGRAM: normal (05/24/2010 8:00)      Preventive Care Screening  Mammogram:    Date:  05/24/2010    Next Due:  05/2011    Results:  normal

## 2010-09-10 NOTE — Op Note (Signed)
Martha Ortega, Martha Ortega              ACCOUNT NO.:  [id number]   MEDICAL RECORD NO.:  [id number]          PATIENT TYPE:  AMB   LOCATION:  ENDO                         FACILITY:  [facility]   PHYSICIAN:  Nelwyn Salisbury, M.D.  DATE OF BIRTH:  Oct 30, 1934   DATE OF PROCEDURE:  06/10/2004  DATE OF DISCHARGE:  06/02/2004                                 OPERATIVE REPORT   PROCEDURE PERFORMED:  Flexible sigmoidoscopy.   ENDOSCOPIST:  Nelwyn Salisbury, M.D.   INSTRUMENT USED:  Olympus video colonoscope.   INDICATIONS FOR PROCEDURE:  A 76 year old African American female with a  questionable inverted diverticulum at 20 cm undergoing a repeat examination  to check for polyps, nodules, etc. in this area.   PREPROCEDURE PREPARATION:  Informed consent was procured from the patient.  The patient fasted for eight hours prior to the procedure and prepped with a  bottle of MiraLax the night prior to the procedure.  The risks and benefits  of the procedure were discussed with her.   PREPROCEDURE PHYSICAL:  VITAL SIGNS:  Stable vital signs.  NECK:  Supple.  CHEST:  Clear to auscultation.  CARDIOVASCULAR:  S1 and S2 regular.  ABDOMEN:  Soft with normal bowel sounds.   DESCRIPTION OF PROCEDURE:  The patient was placed in left lateral decubitus  position.  No sedation was used. Once the patient was adequately positioned,  the Olympus video colonoscope was advanced from the rectum to 40 cm.  There  was large amount of residual stool at 40 cm but no abnormality was noted at  20 cm.  There was no evidence of diverticulum there.  Retroflexion was not  possible as the patient had significant amount of stool in the rectum as  well.   IMPRESSION:  No abnormality noted up to 20 cm.   RECOMMENDATIONS:  1.  Repeat colonoscopy has been advised in the next five years unless the      patient develops any abnormal symptoms in the interim.  2.  In view of her pan diverticulosis noted on previous colonoscopy, high   fiber diet has been recommended.  3.  Outpatient follow-up as needed arises in the future.    JNM/MEDQ  D:  06/10/2004  T:  06/10/2004  Job:  [id number]

## 2010-09-10 NOTE — Letter (Signed)
April 19, 2006    Dr. Glendale Chard  [address], [address], [place]   RE:  Martha Ortega, Martha Ortega  MRN:  [id number]  /  DOB:  Mar 24, 1935   Dear Bailey Mech:   I would like to give you followup on Mickeal Skinner concerning her upper  GI symptoms of swallowing trouble, scratchiness, and suspected  gastroesophageal reflux.  A 24 hour pH probe following unremarkable  upper endoscopy was essentially negative.  The study was done off  Nexium.  She had a total composite score of 10, normal being less than  22.  She had only a few reflux episodes.  No prolonged episodes.  As you  know, her acid-suppressing agents really did not help.  She still has  feelings of scratchiness in the back of her throat and possibly of post-  nasal drip.  It is possible that her symptoms are related to an  ENT  problem although she already sow an ENT specialist in the past.   I have talked to Ms. Ballinger today, and she wanted to be referred again  to an ear, nose, and throat specialist.  Her colonoscopy was incomplete.  She had a very, very torturous colon and especially in the sigmoid area,  it was impossible to get through without significant risk of  perforation..We sent her  for a barium enema, which again confirmed  significany tortuosity and narrowing of the left colon confirming  tortuosity making her a ghigher risk for recurrent  diverticulitis. In my conversation with Mrs Durand today I  told her  about a possibility of recurrent  attacks of LLQ abd. pain and a need  for antibiotics.  Please let me know if I can be of further help in  taking care of this patient's GI problems.  I thinkl it is possible that  she may eventually need a sigmoid resection to avoid recurrent attacks  of diverticulitis.    Sincerely,      Lowella Bandy. Olevia Perches, MD  Electronically Signed    DMB/MedQ  DD: 04/19/2006  DT: 04/19/2006  Job #: [id number]

## 2010-09-10 NOTE — Consult Note (Signed)
Martha Ortega, Martha Ortega              ACCOUNT NO.:  [id number]   MEDICAL RECORD NO.:  [id number]          PATIENT TYPE:  AMB   LOCATION:  ENDO                         FACILITY:  [facility]   PHYSICIAN:  Lowella Bandy. Olevia Perches, MD     DATE OF BIRTH:  [birth year]   DATE OF CONSULTATION:  DATE OF DISCHARGE:  04/03/2006                                 CONSULTATION   PROCEDURE:  Ambulatory pH probe.   INDICATIONS FOR PROCEDURE:  This 76 year old female has been complaining  of chronic cough, dyspepsia, and belching.  Her symptoms have not  improved on proton pump inhibitor.  pH probe has been placed to assess  gastroesophageal reflux.  The test is being done while the patient is on  no medications.   RESULTS:  A 2-channel catheter was placed through the mouth, through the  esophagus, into the stomach and was pulled back and placed 5 cm and 10  cm above the lower esophagus sphincter in the proximal channel, which  was 10 cm above the ileus.  The pH registered less then 4 only 0.1% of  the time all in upright position.  There were only 6 reflux episodes.  There was no episode lasting more then 5 minutes.  The longest episode  lasted less then 1 minute.  In distal channel, which was 5 cm above the  ileus, there was a 4.7% of the time pH less then 4, some of it in a  recumbent position and some in upright position.  Total time of pH less  then 42 minutes only in upright position.  The 59 reflux episodes all in  upright position.  Non of the episodes lasted more then 5 minutes.  The  longest episode lasted exactly 5 minutes and occurred in upright  position.   The composite score analysis showed a total of 62 episodes of reflux,  normal being less then 50, but the distal channel composite score was  10.5, normal being less then 22.  As far as the symptom analysis  concerned there were symptoms of postprandial belching, cough, but none  of them correlated with reflux episodes.   IMPRESSION:  This is an  unremarkable intraesophageal pH probe, which  showed no significant gastroesophageal reflux and no correlation with  symptoms.      Lowella Bandy. Olevia Perches, MD  Electronically Signed     DMB/MEDQ  D:  04/16/2006  T:  04/17/2006  Job:  [id number]

## 2010-09-10 NOTE — Assessment & Plan Note (Signed)
[facility]                           GASTROENTEROLOGY OFFICE NOTE   JENAY, DEHAAN                     MRN:          [id number]  DATE:02/17/2006                            DOB:          [birth year]    Ms. Angelo is a very nice 76 year old patient of Dr. Baird Cancer, who comes  with two issues.  One is scratching and choking in her throat, intermittent  hoarseness, coughing and mucus in her throat for the past 2 years.  She also  had an episode of left lower quadrant abdominal pain when she made the  appointment, which since then has subsided.  She was seen in the past by Dr.  Collene Mares and had a colonoscopy in 2002, and a flexible sigmoidoscopy in March  2006 that showed mild diverticulosis of the left colon.  She has never had a  full blown diverticulitis.  The swallowing trouble is intermittent and  occurs mostly to solids.  She was evaluated by an ear, nose and throat  specialist in 2005, found to have a normal barium swallow.  We do not have  that evaluation for review.   MEDICATIONS:  Metoprolol, Welchol, multiple vitamins, calcium, vitamin D and  glucosamine.   PAST MEDICAL HISTORY:  Significant for:  1. High blood pressure.  2. Hyperlipidemia.  3. Arthritic complaints.  4. Hysterectomy in 1978.  5. Appendectomy in 1978.   FAMILY HISTORY:  Diabetes in sister.   SOCIAL HISTORY:  She is unemployed, has 2 children.  She does not smoke and  does not drink.   REVIEW OF SYSTEMS:  Positive for cough, itching.   PHYSICAL EXAMINATION:  Blood pressure 140/72, pulse 72, weight 157 pounds.  She was alert, oriented, in no distress.  HEENT:  Sclerae is nonicteric.  Her voice was somewhat raspy.  She was  clearing her throat a lot but the neck was supple without adenopathy, no  tenderness.  The thyroid is not enlarged.  The oral cavity was normal.  LUNGS:  Clear to auscultation, no wheezes or rales.  CARDIAC:  Cor with normal S1, normal S2.  ABDOMEN:  Soft, tender in the left lower quadrant in the area of the sigmoid  colon, no palpable mass, no rebound.  The right upper and lower quadrants  were unremarkable.  Bowel sounds were normoactive.  RECTAL:  Exam with normal rectal tones, stool was formed, Hemoccult-  positive.   IMPRESSION:  10 African American female with heme-positive  stool, symptoms of upper gastrointestinal reflux possibly versus pharyngitis  versus vocal cord inflammation, who also has a history diverticulosis with a  recent episode of diverticulosis which has subsided without use of  antibiotics.  Heme-positive stool may be related to local rectal irritation,  or possibly due to a left colon lesion.  This needs to be further evaluated  because of her age.   PLAN:  1. Colonoscopy scheduled.  2. Upper endoscopy to assess for gastroesophageal reflux.  At that point      we will also a biopsy of the distal esophagus to rule out Barrett's      esophagus, and  look at the proximal esophagus to rule out any      structural changes such as esophageal web.  We need to rule out      esophageal stricture.  We will also start the patient on Prilosec 20 mg      a day or Nexium 40 mg a day, whichever her insurance will cover.      Depending on the results of the endo and colonoscopy, she may need      further referral to an ENT specialist.     Lowella Bandy. Olevia Perches, MD    DMB/MedQ  DD: 02/17/2006  DT: 02/18/2006  Job #: [id number]   cc:   Theda Belfast. Baird Cancer, M.D.

## 2010-09-10 NOTE — Procedures (Signed)
[place]  Patient:    Martha Ortega, Martha Ortega                     MRN: [id number] Proc. Date: 06/13/00 Adm. Date:  [id number] Attending:  Juanita Craver CC:         Andria Frames, M.D.   Procedure Report  DATE OF BIRTH:  [birth year]  REFERRING PHYSICIAN:  Andria Frames, M.D.  PROCEDURE PERFORMED:  Colonoscopy.  ENDOSCOPIST:  Nelwyn Salisbury, M.D.  INSTRUMENT USED:  Olympus video colonoscope.  INDICATIONS FOR PROCEDURE:  Screening colonoscopy being performed in a 76 year old African-American female with a history of change in bowel habits, rule out colonic polyps, masses, hemorrhoids etc.  PREPROCEDURE PREPARATION:  Informed consent was procured from the patient. The patient was fasted for eight hours prior to the procedure and prepped with a bottle of magnesium citrate and a gallon of NuLytely the night prior to the procedure.  PREPROCEDURE PHYSICAL:  The patient had stable vital signs.  Neck supple. Chest clear to auscultation.  S1, S2 regular.  Abdomen soft with normal abdominal bowel sounds.  DESCRIPTION OF PROCEDURE:  The patient was placed in the left lateral decubitus position and sedated with 60 mg of Demerol and 5 mg of Versed intravenously.  Once the patient was adequately sedated and maintained on low-flow oxygen and continuous cardiac monitoring, the Olympus video colonoscope was advanced from the rectum to the cecum with difficulty.  The adult colonoscope to facilitate further advancing the scope into the proximal left colon.  There was severe diverticular disease throughout the colon with evidence of inverted diverticulum at about 20 cm.  No definite mass was appreciated.  There was evidence of severe melanosis coli throughout the colon.  The procedure was completed to the cecum.  No polyps were seen.  The patient had a significant amount of residual stool in the colon and very small lesions could be easily  missed.  IMPRESSION: 1. Severe pandiverticulosis. 2. Question inverted diverticulum at 20 cm.  No masses or polyps seen. 3. Severe melanosis coli. 4. Significant amount of residual stool in the colon.  Small lesions could    have been missed.  RECOMMENDATIONS: 1. Low residue diet. 2. Flexible sigmoidoscopy to look at the left colon in one year or earlier    if the patient were to develop any abnormal symptoms in the interim. 3. Outpatient follow-up on a p.r.n. basis. 4. The patient has been advised to increase the fluid and fiber in the diet.    ____________ have been given to the patient and her family for education.DD: 06/13/00 TD:  06/13/00 Job: [id number]

## 2010-09-10 NOTE — Assessment & Plan Note (Signed)
[facility]                         GASTROENTEROLOGY OFFICE NOTE   Martha Ortega, Martha Ortega                     MRN:          [id number]  DATE:06/15/2006                            DOB:          Jun 13, 1934    [name] is a delightful 76 year old patient of Dr. Baird Cancer who has  hoarseness and who was evaluated for gastroesophageal reflux.  Her 24-  hour intraesophageal pH probe was completely negative, it was done off  medications.  She denies any dysphagia or odynophagia.  Upper endoscopy  done on March 21, 2006, showed no evidence for hiatal hernia.  Biopsies from the GE junction showed mild nonspecific inflammation  suggestive of acid reflux, but clinically she has not had any reflux and  has not taken any acid-suppressing agents.  She was tried on Nexium for  awhile without any improvement of her hoarseness.  Since the exam in  November, patient started drinking green tea and claims that she has  been at least 50-60% improved.  The cough is completely gone, she just  has some hoarseness intermittently.  She is suspecting that the  hoarseness is due to sinus problems and drainage.   Another problem was left lower quadrant abdominal pain.  Attempt for  colonoscopy was incomplete because of severe tortuosity and narrowing of  the sigmoid colon.  Subsequent barium enema after the incomplete  colonoscopy.  It confirmed tortuous narrow sigmoid colon with many  diverticula.  She has started on flaxseed with complete resolution of  her abdominal pain.  Her bowel habits are regular now.   We have discussed plans for current evaluation of her hoarseness.  She  is currently satisfied with her improvement and wants to continue on the  green tea, but I wrote her a prescription for Medrol pack to use p.r.n.  continued hoarseness.  The other possibility is for her to see an ear,  nose, throat specialist for sinus problems, allergies or any sort of  vocal or  cord problem, and she would contact Dr. Baird Cancer about that.  I  do not really have to see her on a regular basis.     Lowella Bandy. Olevia Perches, MD  Electronically Signed    DMB/MedQ  DD: 06/15/2006  DT: 06/15/2006  Job #: [id number]   cc:   Theda Belfast. Baird Cancer, M.D.

## 2010-09-10 NOTE — Op Note (Signed)
   NAME:  Martha Ortega, Martha Ortega                        ACCOUNT NO.:  [id number]   MEDICAL RECORD NO.:  [id number]                   PATIENT TYPE:  AMB   LOCATION:  ENDO                                 FACILITY:  [facility]   PHYSICIAN:  Nelwyn Salisbury, M.D.               DATE OF BIRTH:  Mar 09, 1935   DATE OF PROCEDURE:  09/30/2002  DATE OF DISCHARGE:                                 OPERATIVE REPORT   PROCEDURE:  Flexible sigmoidoscopy.   ENDOSCOPIST:  Nelwyn Salisbury, M.D.   INSTRUMENT USED:  Olympus video colonoscope.   INDICATIONS FOR PROCEDURE:  Questionably inverted diverticulum at 20 cm in a  76 year old African-American female.  A repeat flexible sigmoidoscopy is  being done to rule out a mass lesion in this area.   PRE-PROCEDURE PREPARATION:  An informed consent was procured from the  patient.  The patient was fasted for eight hours prior to the procedure and  prepped with a bottle of MiraLax on the night prior to the procedure.   PRE-PROCEDURE PHYSICAL EXAMINATION:  VITAL SIGNS:  Stable vital signs.  NECK:  Supple.  CHEST:  Clear to auscultation.  HEART:  S1 and S2 regular.  ABDOMEN:  Soft, with normal bowel sounds.   DESCRIPTION OF PROCEDURE:  The patient was placed in the left lateral  decubitus position and sedated with 50 mg of Demerol and 5 mg of Versed  intravenously.  Once the patient was adequately sedated and maintained on  low-flow oxygen and continuous cardiac monitoring, the Olympus video  colonoscope was advanced into the rectum to about 40 cm with difficulty.  There was a large amount of residual stool in the colon.  Sigmoid  diverticulosis was noted and a diverticula was noted at 20 cm.  As well  there was some narrowing with edema of the mucosa at 10 cm, but no definite  mass was seen.  A small internal hemorrhoid was seen on retroflexion.  The patient tolerated the procedure well without complications.   IMPRESSION:  1. Left-sided diverticulosis.  2.  Questionable narrowing of the colonic lumen at 10 cm.  No definite masses     seen.  3. Small internal hemorrhoid.   RECOMMENDATIONS:  1. A high fiber diet.  2. Liberal fluid intake has been advised.  3. A repeat flexible sigmoidoscopy within the next one year or earlier if     need be, using a pediatric scope.  4. Outpatient followup in the next two weeks with further recommendations.                                              Nelwyn Salisbury, M.D.   JNM/MEDQ  D:  09/30/2002  T:  09/30/2002  Job:  [id number]

## 2010-11-23 ENCOUNTER — Encounter: Payer: Self-pay | Admitting: Family Medicine

## 2010-11-25 ENCOUNTER — Telehealth: Payer: Self-pay | Admitting: Family Medicine

## 2010-11-25 ENCOUNTER — Other Ambulatory Visit (INDEPENDENT_AMBULATORY_CARE_PROVIDER_SITE_OTHER): Payer: Medicare Other

## 2010-11-25 DIAGNOSIS — N259 Disorder resulting from impaired renal tubular function, unspecified: Secondary | ICD-10-CM

## 2010-11-25 DIAGNOSIS — E785 Hyperlipidemia, unspecified: Secondary | ICD-10-CM

## 2010-11-25 DIAGNOSIS — K219 Gastro-esophageal reflux disease without esophagitis: Secondary | ICD-10-CM

## 2010-11-25 DIAGNOSIS — R7309 Other abnormal glucose: Secondary | ICD-10-CM

## 2010-11-25 DIAGNOSIS — I10 Essential (primary) hypertension: Secondary | ICD-10-CM

## 2010-11-25 LAB — LIPID PANEL
HDL: 38.8 mg/dL — ABNORMAL LOW (ref >39.00)
LDL Cholesterol: 92 mg/dL (ref 0–99)
Total CHOL/HDL Ratio: 4
VLDL: 18.6 mg/dL (ref 0.0–40.0)

## 2010-11-25 LAB — CBC WITH DIFFERENTIAL/PLATELET
Basophils Absolute: 0 10*3/uL (ref 0.0–0.1)
HCT: 38.5 % (ref 36.0–46.0)
Lymphs Abs: 3.2 10*3/uL (ref 0.7–4.0)
MCV: 88.6 fl (ref 78.0–100.0)
Monocytes Absolute: 0.4 10*3/uL (ref 0.1–1.0)
Neutrophils Relative %: 28.9 % — ABNORMAL LOW (ref 43.0–77.0)
Platelets: 193 10*3/uL (ref 150.0–400.0)
RDW: 13.9 % (ref 11.5–14.6)

## 2010-11-25 LAB — COMPREHENSIVE METABOLIC PANEL
ALT: 17 U/L (ref 0–35)
AST: 21 U/L (ref 0–37)
Alkaline Phosphatase: 76 U/L (ref 39–117)
Sodium: 143 mEq/L (ref 135–145)
Total Bilirubin: 1 mg/dL (ref 0.3–1.2)
Total Protein: 6.9 g/dL (ref 6.0–8.3)

## 2010-11-25 LAB — HEMOGLOBIN A1C: Hgb A1c MFr Bld: 5.5 % (ref 4.6–6.5)

## 2010-11-25 NOTE — Telephone Encounter (Signed)
Message copied by [name] on Thu Nov 25, 2010  9:15 AM ------      Message from: Ellamae Sia      Created: Wed Nov 24, 2010 10:51 AM       Labs for Thursday, please. T

## 2010-11-25 NOTE — Telephone Encounter (Signed)
Message copied by [name] on Thu Nov 25, 2010  9:10 AM ------      Message from: Marchia Bond      Created: Mon Nov 22, 2010  7:39 AM      Regarding: Cpx labs thurs       Please order  future cpx labs for pt's upcomming lab appt.      Thanks      Aniceto Boss

## 2010-11-29 ENCOUNTER — Ambulatory Visit: Payer: Self-pay | Admitting: Family Medicine

## 2010-12-07 ENCOUNTER — Ambulatory Visit (INDEPENDENT_AMBULATORY_CARE_PROVIDER_SITE_OTHER): Payer: Medicare Other | Admitting: Family Medicine

## 2010-12-07 ENCOUNTER — Encounter: Payer: Self-pay | Admitting: Family Medicine

## 2010-12-07 DIAGNOSIS — R7309 Other abnormal glucose: Secondary | ICD-10-CM

## 2010-12-07 DIAGNOSIS — N259 Disorder resulting from impaired renal tubular function, unspecified: Secondary | ICD-10-CM

## 2010-12-07 DIAGNOSIS — I10 Essential (primary) hypertension: Secondary | ICD-10-CM

## 2010-12-07 DIAGNOSIS — E785 Hyperlipidemia, unspecified: Secondary | ICD-10-CM

## 2010-12-07 MED ORDER — AMLODIPINE BESYLATE 5 MG PO TABS: 5.0000 mg | ORAL_TABLET | Freq: Every day | ORAL | Status: DC

## 2010-12-07 MED ORDER — SIMVASTATIN 20 MG PO TABS: 20.0000 mg | ORAL_TABLET | Freq: Every day | ORAL | Status: DC

## 2010-12-07 NOTE — Progress Notes (Signed)
Subjective:    Patient ID: Martha Ortega Comment, female    DOB: Jun 05, 1934, 76 y.o.   MRN: [id number]  HPI Here for annual check up of chronic medical problems (will also rev health mt list) Is feeling good  No new med problem   Zoster status - had the shingles vaccine in a study in 2002 - got the real thing  Never had the disease    colonosc 11/07 was told she needed next one in 10 years - no polyps  No colon cancer in family  Hx of tics  Tdap 7/10  Pneumovax up to date 1/05  Nl mammogram 1/12 Self exam no lumps or breast changes   Had hysterectomy - fibroids and bleeding  No hx of abn pap smear    Lipids fairly stable with LDL 92- but direct is higher at 152 Lab Results  Component Value Date   CHOL 149 11/25/2010   CHOL 148 05/17/2010   CHOL 176 09/14/2009   Lab Results  Component Value Date   HDL 38.80* 11/25/2010   HDL 31.50* 05/17/2010   HDL 35.30* 09/14/2009   Lab Results  Component Value Date   LDLCALC 92 11/25/2010   LDLCALC 88 05/17/2010   LDLCALC 109* 09/14/2009   Lab Results  Component Value Date   TRIG 93.0 11/25/2010   TRIG 143.0 05/17/2010   TRIG 161.0* 09/14/2009   Lab Results  Component Value Date   CHOLHDL 4 11/25/2010   CHOLHDL 5 05/17/2010   CHOLHDL 5 09/14/2009   Lab Results  Component Value Date   LDLDIRECT 152.8 09/08/2006    Diet - is fairly good - stays away from sat fats  Exercise- likes to walk -- gets in 1 day per week / does hurt her legs a bit  Also the heat   HTN fair with 140/84 today  She is feeling fine  Was stressed trying to get here on time   Hyperglycemia in past - a1c 5.5- not bad Diet - tries to keep away from sugar    Renal insuff - 1.3 cr - no changes  Fluid intake -- tries hard to drink a lot of water - at least 8 glasses per day  No urinary symptoms This has been chronic for some time  She does want to loose weight -- would like to loose  Not enough exercise  Has not really worked on portion control yet   Patient  Active Problem List  Diagnoses  . HYPERLIPIDEMIA  . HYPERTENSION  . POSTNASAL DRIP SYNDROME  . ALLERGIC RHINITIS  . GERD  . RENAL INSUFFICIENCY  . FASTING HYPERGLYCEMIA  . DIVERTICULITIS, HX OF   Past Medical History  Diagnosis Date  . History of diverticulitis of colon   . Hyperlipidemia   . Hypertension   . Allergic rhinitis   . Hyperglycemia 09    mild  . Abnormal blood findings     baseline cr 1.3, watching for renal insufficiency   Past Surgical History  Procedure Date  . Appendectomy   . Abdominal hysterectomy   . Cystectomy 93    R axilla  . Cystectomy 97    R breast  . Cardiovascular stress test 07/21/04  . Barium swallow 10/18/03   History  Substance Use Topics  . Smoking status: Former Smoker    Quit date: 04/25/1978  . Smokeless tobacco: Not on file  . Alcohol Use: Not on file   Family History  Problem Relation Age of Onset  . Arthritis  Mother   . Arthritis Sister   . Lupus Sister     ?  . Diabetes Sister   . Prostate cancer Brother   . Stroke Father   . Hypertension Father     ?   Allergies  Allergen Reactions  . Fluticasone Propionate     REACTION: does not help  . Loratadine     REACTION: does not work  . Lovastatin     REACTION: was not effective  . Rosuvastatin     REACTION: GI side eff   Current Outpatient Prescriptions on File Prior to Visit  Medication Sig Dispense Refill  . amLODipine (NORVASC) 5 MG tablet Take 5 mg by mouth daily.        . simvastatin (ZOCOR) 20 MG tablet Take 20 mg by mouth. Every evening with a low fat snack.           Review of Systems Review of Systems  Constitutional: Negative for fever, appetite change, fatigue and unexpected weight change.  Eyes: Negative for pain and visual disturbance.  Respiratory: Negative for cough and shortness of breath.   Cardiovascular: Negative.  for cp or palpitations Gastrointestinal: Negative for nausea, diarrhea and constipation.  Genitourinary: Negative for urgency  and frequency.  Skin: Negative for pallor. or rash  Neurological: Negative for weakness, light-headedness, numbness and headaches.  Hematological: Negative for adenopathy. Does not bruise/bleed easily.  Psychiatric/Behavioral: Negative for dysphoric mood. The patient is not nervous/anxious.          Objective:   Physical Exam  Constitutional: She appears well-developed and well-nourished. No distress.       overwt and well appearing   HENT:  Head: Normocephalic and atraumatic.  Right Ear: External ear normal.  Left Ear: External ear normal.  Nose: Nose normal.  Mouth/Throat: Oropharynx is clear and moist.  Eyes: Conjunctivae and EOM are normal. Pupils are equal, round, and reactive to light.  Neck: Normal range of motion. Neck supple. No JVD present. Carotid bruit is not present. No thyromegaly present.  Cardiovascular: Normal rate, regular rhythm, normal heart sounds and intact distal pulses.   Pulmonary/Chest: Effort normal and breath sounds normal. No respiratory distress. She has no wheezes. She has no rales.  Abdominal: Soft. Bowel sounds are normal. She exhibits no distension, no abdominal bruit and no mass. There is no tenderness.  Genitourinary: No breast swelling, tenderness, discharge or bleeding.  Musculoskeletal: Normal range of motion. She exhibits no edema and no tenderness.  Lymphadenopathy:    She has no cervical adenopathy.  Neurological: She is alert. She has normal reflexes. No cranial nerve deficit. Coordination normal.  Skin: Skin is warm and dry. No rash noted. No erythema. No pallor.  Psychiatric: She has a normal mood and affect.          Assessment & Plan:

## 2010-12-07 NOTE — Assessment & Plan Note (Signed)
Better on 2nd check  Adequate control  On amlodipine low dose (aware of poss interaction with zocor so will watch carefully) No change now-/ no symptoms  Disc plan to begin exercise 5 d per week and avoid excess sodium

## 2010-12-07 NOTE — Assessment & Plan Note (Signed)
This is fairly controlled with zocor - but HDL a bit too low  Disc plan for exercise Rev low sat fat diet  Rev labs with pt  Will continue to work on it  Due to lack of muscle symptoms - will continue this low dose with the amlodipine and watch closely  Pt aware

## 2010-12-07 NOTE — Assessment & Plan Note (Signed)
Chronic and unchanged in pt with HTN and lipids  1.3 cr Pt aware to increase her water intake Will continue to watch carefully   no urinary symptoms  F/u 6 months

## 2010-12-07 NOTE — Patient Instructions (Signed)
Here are your px to send in  Work on increasing exercise to goal of 30 minutes 5 days per week - mix up different types of exercise Also for weight loss - cut your portions by 1/4 - and avoid sweets and junk food  Avoid red meat/ fried foods/ egg yolks/ fatty breakfast meats/ butter, cheese and high fat dairy/ and shellfish   Remember to drink your water - increase to 10-12 servings per day  Follow up in 6 months

## 2011-04-27 ENCOUNTER — Other Ambulatory Visit: Payer: Self-pay | Admitting: Family Medicine

## 2011-04-27 DIAGNOSIS — Z1231 Encounter for screening mammogram for malignant neoplasm of breast: Secondary | ICD-10-CM

## 2011-05-26 ENCOUNTER — Ambulatory Visit
Admission: RE | Admit: 2011-05-26 | Discharge: 2011-05-26 | Disposition: A | Discharge status: Home / Self Care | Payer: Medicare Other | Source: Ambulatory Visit | Attending: Family Medicine | Admitting: Family Medicine

## 2011-05-26 DIAGNOSIS — Z1231 Encounter for screening mammogram for malignant neoplasm of breast: Secondary | ICD-10-CM | POA: Diagnosis not present

## 2011-05-27 ENCOUNTER — Encounter: Payer: Self-pay | Admitting: *Deleted

## 2011-06-10 ENCOUNTER — Ambulatory Visit (INDEPENDENT_AMBULATORY_CARE_PROVIDER_SITE_OTHER): Payer: Medicare Other | Admitting: Family Medicine

## 2011-06-10 ENCOUNTER — Encounter: Payer: Self-pay | Admitting: Family Medicine

## 2011-06-10 VITALS — BP 130/82 | HR 88 | Temp 98.0°F | Ht 63.0 in | Wt 157.5 lb

## 2011-06-10 DIAGNOSIS — I10 Essential (primary) hypertension: Secondary | ICD-10-CM | POA: Diagnosis not present

## 2011-06-10 DIAGNOSIS — N259 Disorder resulting from impaired renal tubular function, unspecified: Secondary | ICD-10-CM | POA: Diagnosis not present

## 2011-06-10 DIAGNOSIS — E785 Hyperlipidemia, unspecified: Secondary | ICD-10-CM

## 2011-06-10 LAB — RENAL FUNCTION PANEL
Creatinine, Ser: 1.2 mg/dL (ref 0.4–1.2)
GFR: 54.35 mL/min — ABNORMAL LOW (ref >60.00)
Glucose, Bld: 88 mg/dL (ref 70–99)
Sodium: 141 mEq/L (ref 135–145)

## 2011-06-10 LAB — LIPID PANEL
HDL: 37.8 mg/dL — ABNORMAL LOW (ref >39.00)
Total CHOL/HDL Ratio: 6
Triglycerides: 133 mg/dL (ref 0.0–149.0)

## 2011-06-10 LAB — LDL CHOLESTEROL, DIRECT: Direct LDL: 179.5 mg/dL

## 2011-06-10 NOTE — Assessment & Plan Note (Signed)
Lab today Cr last 1.3  Good water intake Likely multifactorial - continue to follow No sympt

## 2011-06-10 NOTE — Patient Instructions (Signed)
Keep up the good work with diet and exercise and weight loss Stay off the cholesterol med- you are allergic to it Labs today  bp looks good

## 2011-06-10 NOTE — Assessment & Plan Note (Signed)
Off zocor- allergic rxn with throat swelling Has failed 3 statins now  Will see how chol is with good diet - making great effort Rev low sat fat diet

## 2011-06-10 NOTE — Progress Notes (Signed)
Subjective:    Patient ID: Martha Ortega Comment, female    DOB: 05-May-1934, 77 y.o.   MRN: [id number]  HPI Here for check up of chronic medical conditions  Is on cholesterol med - made her throat and tongue  Is eating better - oatmeal and flax  Has failed 2 other statins also - done with chol meds  Does not want to try any more - afraid of a worse reaction  L side and hip hurt a lot -ongoing /chronic / limits exercise/ OA  bp is  130/82   Today No cp or palpitations or headaches or edema  No side effects to medicines   No problems at [facility] down 11 lb with bmi of 27 Is working on it -- healthy diet and also exercise (walking tapes ) 1-4 mi per day   Hyperglycemia Lab Results  Component Value Date   HGBA1C 5.5 11/25/2010     Flu shot- got one in the fall   colonosc 07  mammo 1/12 Self exam   Dexa?  Ca and D   Lab Results  Component Value Date   CHOL 149 11/25/2010   CHOL 148 05/17/2010   CHOL 176 09/14/2009   Lab Results  Component Value Date   HDL 38.80* 11/25/2010   HDL 31.50* 05/17/2010   HDL 35.30* 09/14/2009   Lab Results  Component Value Date   LDLCALC 92 11/25/2010   LDLCALC 88 05/17/2010   LDLCALC 109* 09/14/2009   Lab Results  Component Value Date   TRIG 93.0 11/25/2010   TRIG 143.0 05/17/2010   TRIG 161.0* 09/14/2009   Lab Results  Component Value Date   CHOLHDL 4 11/25/2010   CHOLHDL 5 05/17/2010   CHOLHDL 5 09/14/2009   Lab Results  Component Value Date   LDLDIRECT 152.8 09/08/2006   last LDL calc ok  Diet  Renal insuff Lab Results  Component Value Date   CREATININE 1.3* 11/25/2010   BUN 14 11/25/2010   NA 143 11/25/2010   K 4.5 11/25/2010   CL 107 11/25/2010   CO2 25 11/25/2010  drinks lots and lots of water  Just watching this for now has been stable No urinary symptoms  Likely multifactorial    Review of Systems Review of Systems  Constitutional: Negative for fever, appetite change, fatigue and unexpected weight change.  Eyes: Negative for pain  and visual disturbance.  Respiratory: Negative for cough and shortness of breath.   Cardiovascular: Negative for cp or palpitations    Gastrointestinal: Negative for nausea, diarrhea and constipation.  Genitourinary: Negative for urgency and frequency.  Skin: Negative for pallor or rash   MSK pos for L hip and knee pain/ neg for joint swelling  Neurological: Negative for weakness, light-headedness, numbness and headaches.  Hematological: Negative for adenopathy. Does not bruise/bleed easily.  Psychiatric/Behavioral: Negative for dysphoric mood. The patient is not nervous/anxious.          Objective:   Physical Exam  Constitutional: She appears well-developed and well-nourished. No distress.       overwt and well appearing   HENT:  Head: Normocephalic and atraumatic.  Mouth/Throat: Oropharynx is clear and moist.  Eyes: Conjunctivae and EOM are normal. Pupils are equal, round, and reactive to light. No scleral icterus.  Neck: Normal range of motion. Neck supple. Normal carotid pulses and no JVD present. Carotid bruit is not present. No thyromegaly present.  Cardiovascular: Normal rate, regular rhythm, normal heart sounds and intact distal pulses.  Exam reveals no gallop.   Pulmonary/Chest: Effort normal and breath sounds normal. No respiratory distress. She has no wheezes.  Abdominal: Soft. Bowel sounds are normal. She exhibits no distension, no abdominal bruit and no mass. There is no tenderness.  Musculoskeletal: Normal range of motion. She exhibits no tenderness.       Poor rom L hip and knee  Lymphadenopathy:    She has no cervical adenopathy.  Neurological: She is alert. She has normal reflexes. No cranial nerve deficit. She exhibits normal muscle tone. Coordination normal.  Skin: Skin is warm and dry. No rash noted. No erythema. No pallor.  Psychiatric: She has a normal mood and affect.       Cheerful and talkative           Assessment & Plan:

## 2011-06-10 NOTE — Assessment & Plan Note (Signed)
bp in fair control at this time  No changes needed  Disc lifstyle change with low sodium diet and exercise  Lab today 

## 2011-12-12 ENCOUNTER — Encounter: Payer: Self-pay | Admitting: Family Medicine

## 2011-12-12 ENCOUNTER — Ambulatory Visit (INDEPENDENT_AMBULATORY_CARE_PROVIDER_SITE_OTHER): Payer: Medicare Other | Admitting: Family Medicine

## 2011-12-12 VITALS — BP 120/68 | HR 66 | Temp 98.1°F | Ht 63.0 in | Wt 157.8 lb

## 2011-12-12 DIAGNOSIS — I10 Essential (primary) hypertension: Secondary | ICD-10-CM

## 2011-12-12 DIAGNOSIS — N259 Disorder resulting from impaired renal tubular function, unspecified: Secondary | ICD-10-CM | POA: Diagnosis not present

## 2011-12-12 DIAGNOSIS — E785 Hyperlipidemia, unspecified: Secondary | ICD-10-CM

## 2011-12-12 DIAGNOSIS — K219 Gastro-esophageal reflux disease without esophagitis: Secondary | ICD-10-CM | POA: Diagnosis not present

## 2011-12-12 LAB — COMPREHENSIVE METABOLIC PANEL
ALT: 16 U/L (ref 0–35)
AST: 18 U/L (ref 0–37)
Creatinine, Ser: 1.2 mg/dL (ref 0.4–1.2)
Total Bilirubin: 0.9 mg/dL (ref 0.3–1.2)

## 2011-12-12 LAB — LIPID PANEL
HDL: 41.2 mg/dL (ref >39.00)
VLDL: 23.8 mg/dL (ref 0.0–40.0)

## 2011-12-12 LAB — LDL CHOLESTEROL, DIRECT: Direct LDL: 158.1 mg/dL

## 2011-12-12 MED ORDER — OMEPRAZOLE 20 MG PO CPDR: 20.0000 mg | DELAYED_RELEASE_CAPSULE | Freq: Every day | ORAL | Status: DC

## 2011-12-12 NOTE — Assessment & Plan Note (Signed)
Lipids today Off statin due to poss angioedema symptoms Rev low sat fat diet  Will disc at f/u 4-6 wk

## 2011-12-12 NOTE — Patient Instructions (Addendum)
Start omeprazole (generic prilosec) 1 pill daily in am for indigestion - take it every day  Labs today Avoid red meat/ fried foods/ egg yolks/ fatty breakfast meats/ butter, cheese and high fat dairy/ and shellfish   Follow up with me in about 4-6 weeks

## 2011-12-12 NOTE — Assessment & Plan Note (Signed)
Has had this on and off in past- worse now  Suspect this may be causing throat symptoms and globus sensation also Will start omeprazole 20 mg daily Also diet disc F/u 4-6 wk

## 2011-12-12 NOTE — Assessment & Plan Note (Signed)
Lab today No urinary symptoms Rev fluid intake bp good

## 2011-12-12 NOTE — Progress Notes (Signed)
Subjective:    Patient ID: Martha Ortega, female    DOB: [birth year], 77 y.o.   MRN: [id number]  HPI Here for f/u of hyperlipidemia and also renal insuff  Is doing well overall  Still has a little swelling -not as bad as she was when she was on chol med  On the med - had swelling of tongue and throat and lips  Gets indigestion occasionally  Feels full in her chest - better if she gets up out of her bed  Gets it at night - and takes alka selzer- gets rid of  Feels like she has mucous in her throat all the time and constantly clears it   No particular foods make it worse Does not eat late at night  Very seldom gets dysphagia    BP Readings from Last 3 Encounters:  12/12/11 120/68  06/10/11 130/82  12/07/10 134/80    Lab Results  Component Value Date   CHOL 238* 06/10/2011   HDL 37.80* 06/10/2011   LDLCALC 92 11/25/2010   LDLDIRECT 179.5 06/10/2011   TRIG 133.0 06/10/2011   CHOLHDL 6 06/10/2011    Has been watching her diet closely since not on med (due to side eff-poss all to statin) At times - going out of town, has less control over diet/ also a few deaths in family  Does not often eat junk or fried foods Eats a lot of salads and baked foods   Patient Active Problem List  Diagnosis  . HYPERLIPIDEMIA  . HYPERTENSION  . POSTNASAL DRIP SYNDROME  . ALLERGIC RHINITIS  . GERD  . RENAL INSUFFICIENCY  . FASTING HYPERGLYCEMIA  . DIVERTICULITIS, HX OF   Past Medical History  Diagnosis Date  . History of diverticulitis of colon   . Hyperlipidemia   . Hypertension   . Allergic rhinitis   . Hyperglycemia 09    mild  . Abnormal blood findings     baseline cr 1.3, watching for renal insufficiency   Past Surgical History  Procedure Date  . Appendectomy   . Abdominal hysterectomy   . Cystectomy 93    R axilla  . Cystectomy 97    R breast  . Cardiovascular stress test 07/21/04  . Barium swallow 10/18/03   History  Substance Use Topics  . Smoking status: Former  Smoker    Quit date: 04/25/1978  . Smokeless tobacco: Not on file  . Alcohol Use: Not on file   Family History  Problem Relation Age of Onset  . Arthritis Mother   . Arthritis Sister   . Lupus Sister     ?  . Diabetes Sister   . Prostate cancer Brother   . Stroke Father   . Hypertension Father     ?   Allergies  Allergen Reactions  . Fluticasone Propionate     REACTION: does not help  . Loratadine     REACTION: does not work  . Lovastatin     REACTION: was not effective  . Rosuvastatin     REACTION: GI side eff  . Zocor (Simvastatin - High Dose)     Mouth and throat swelled   Current Outpatient Prescriptions on File Prior to Visit  Medication Sig Dispense Refill  . amLODipine (NORVASC) 5 MG tablet Take 1 tablet (5 mg total) by mouth daily.  90 tablet  3     Review of Systems    Review of Systems  Constitutional: Negative for fever, appetite change, fatigue and unexpected weight  change.  Eyes: Negative for pain and visual disturbance.  Respiratory: Negative for cough and shortness of breath.   Cardiovascular: Negative for cp or palpitations    Gastrointestinal: Negative for nausea, diarrhea and constipation. pos for heartburn and reflux of acid into her throat, neg for abdominal pain Genitourinary: Negative for urgency and frequency.  Skin: Negative for pallor or rash   Neurological: Negative for weakness, light-headedness, numbness and headaches.  Hematological: Negative for adenopathy. Does not bruise/bleed easily.  Psychiatric/Behavioral: Negative for dysphoric mood. The patient is not nervous/anxious.      Objective:   Physical Exam  Constitutional: She appears well-developed and well-nourished. No distress.       overwt and well app  HENT:  Head: Normocephalic and atraumatic.  Mouth/Throat: Oropharynx is clear and moist.  Eyes: Conjunctivae and EOM are normal. Pupils are equal, round, and reactive to light. Right eye exhibits no discharge. Left eye  exhibits no discharge.  Neck: Normal range of motion. Neck supple. No JVD present. Carotid bruit is not present. No thyromegaly present.  Cardiovascular: Normal rate, regular rhythm, normal heart sounds and intact distal pulses.  Exam reveals no gallop.   Pulmonary/Chest: Effort normal and breath sounds normal. No respiratory distress. She has no wheezes.  Abdominal: Soft. Bowel sounds are normal. She exhibits no distension, no abdominal bruit and no mass. There is no tenderness.  Musculoskeletal: She exhibits no edema and no tenderness.  Lymphadenopathy:    She has no cervical adenopathy.  Neurological: She is alert. She has normal reflexes. No cranial nerve deficit. She exhibits normal muscle tone. Coordination normal.  Skin: Skin is warm and dry. No rash noted. No erythema. No pallor.  Psychiatric: She has a normal mood and affect.          Assessment & Plan:

## 2011-12-12 NOTE — Assessment & Plan Note (Signed)
bp in fair control at this time  No changes needed  Disc lifstyle change with low sodium diet and exercise   Labs today 

## 2012-01-23 ENCOUNTER — Encounter: Payer: Self-pay | Admitting: Family Medicine

## 2012-01-23 ENCOUNTER — Ambulatory Visit (INDEPENDENT_AMBULATORY_CARE_PROVIDER_SITE_OTHER): Payer: Medicare Other | Admitting: Family Medicine

## 2012-01-23 VITALS — BP 138/85 | HR 78 | Temp 97.4°F | Ht 63.0 in | Wt 160.5 lb

## 2012-01-23 DIAGNOSIS — Z23 Encounter for immunization: Secondary | ICD-10-CM | POA: Diagnosis not present

## 2012-01-23 DIAGNOSIS — I10 Essential (primary) hypertension: Secondary | ICD-10-CM | POA: Diagnosis not present

## 2012-01-23 DIAGNOSIS — K219 Gastro-esophageal reflux disease without esophagitis: Secondary | ICD-10-CM | POA: Diagnosis not present

## 2012-01-23 DIAGNOSIS — E785 Hyperlipidemia, unspecified: Secondary | ICD-10-CM | POA: Diagnosis not present

## 2012-01-23 NOTE — Assessment & Plan Note (Signed)
Not at goal but imp with LDL in 150s Pt allergic to statins Not interested in other options Great diet F/u 6 mo for re check Commended on improvement so far

## 2012-01-23 NOTE — Assessment & Plan Note (Signed)
bp in fair control at this time  No changes needed  Disc lifstyle change with low sodium diet and exercise  Better on 2nd check today- she was nervous

## 2012-01-23 NOTE — Assessment & Plan Note (Signed)
Improved after a week of PPI Had to stop omeprazole due to symptoms of angioedema No longer has any symptoms at all  If problems return- may need to try H2 blocker

## 2012-01-23 NOTE — Patient Instructions (Addendum)
If your GI symptoms return - please let me know  Try to eat a healthy diet  Cholesterol is improved but not at goal so keep working on it (Avoid red meat/ fried foods/ egg yolks/ fatty breakfast meats/ butter, cheese and high fat dairy/ and shellfish  ) Flu shot today

## 2012-01-23 NOTE — Progress Notes (Signed)
Subjective:    Patient ID: Martha Ortega Comment, female    DOB: October 14, 1934, 77 y.o.   MRN: [id number]  HPI Here for f/u of GERD and other chronic issues   On omeprazole 20 mg  Took it for about a week  Helped her symptoms a lot  Had to stop it after a week due to tongue swelling  Problem went away anyway Will let us know if symptoms return   Renal insuff   Chemistry      Component Value Date/Time   NA 142 12/12/2011 1218   K 4.2 12/12/2011 1218   CL 108 12/12/2011 1218   CO2 28 12/12/2011 1218   BUN 13 12/12/2011 1218   CREATININE 1.2 12/12/2011 1218      Component Value Date/Time   CALCIUM 9.3 12/12/2011 1218   ALKPHOS 86 12/12/2011 1218   AST 18 12/12/2011 1218   ALT 16 12/12/2011 1218   BILITOT 0.9 12/12/2011 1218     nl labs  Lipids- cannot have statin due to angioedema  Lab Results  Component Value Date   CHOL 217* 12/12/2011   CHOL 238* 06/10/2011   CHOL 149 11/25/2010   Lab Results  Component Value Date   HDL 41.20 12/12/2011   HDL 37.80* 06/10/2011   HDL 38.80* 11/25/2010   Lab Results  Component Value Date   LDLCALC 92 11/25/2010   LDLCALC 88 05/17/2010   LDLCALC 109* 09/14/2009   Lab Results  Component Value Date   TRIG 119.0 12/12/2011   TRIG 133.0 06/10/2011   TRIG 93.0 11/25/2010   Lab Results  Component Value Date   CHOLHDL 5 12/12/2011   CHOLHDL 6 06/10/2011   CHOLHDL 4 11/25/2010   Lab Results  Component Value Date   LDLDIRECT 158.1 12/12/2011   LDLDIRECT 179.5 06/10/2011   LDLDIRECT 152.8 09/08/2006   more flax and oatmeal now  There is improvement Pt is allergic to statins  Avoiding fried foods/ eggs/beef/ junk food/ cheese - diet is pretty good  Some exercise -- but got off track    bp is up a bit today- better on 2nd check  No cp or palpitations or headaches or edema  No side effects to medicines  BP Readings from Last 3 Encounters:  01/23/12 154/92  12/12/11 120/68  06/10/11 130/82    At home today 134/78 Feels fine    Review of Systems Review  of Systems  Constitutional: Negative for fever, appetite change, fatigue and unexpected weight change.  Eyes: Negative for pain and visual disturbance.  Respiratory: Negative for cough and shortness of breath.   Cardiovascular: Negative for cp or palpitations    Gastrointestinal: Negative for nausea, diarrhea and constipation.  Genitourinary: Negative for urgency and frequency.  Skin: Negative for pallor or rash   Neurological: Negative for weakness, light-headedness, numbness and headaches.  Hematological: Negative for adenopathy. Does not bruise/bleed easily.  Psychiatric/Behavioral: Negative for dysphoric mood. The patient is not nervous/anxious.         Objective:   Physical Exam  Constitutional: She appears well-developed and well-nourished. No distress.  HENT:  Head: Normocephalic and atraumatic.  Mouth/Throat: Oropharynx is clear and moist.  Eyes: Conjunctivae normal and EOM are normal. Pupils are equal, round, and reactive to light. No scleral icterus.  Neck: Normal range of motion. Neck supple. No JVD present. Carotid bruit is not present. No thyromegaly present.  Cardiovascular: Normal rate, regular rhythm, normal heart sounds and intact distal pulses.  Exam reveals no gallop.  Pulmonary/Chest: Effort normal and breath sounds normal. No respiratory distress. She has no wheezes.  Abdominal: Soft. Bowel sounds are normal. She exhibits no distension, no abdominal bruit and no mass. There is no tenderness.  Musculoskeletal: She exhibits no edema and no tenderness.  Lymphadenopathy:    She has no cervical adenopathy.  Neurological: She is alert. She has normal reflexes. No cranial nerve deficit. She exhibits normal muscle tone. Coordination normal.  Skin: Skin is warm and dry. No rash noted. No erythema. No pallor.  Psychiatric: She has a normal mood and affect.          Assessment & Plan:

## 2012-05-02 ENCOUNTER — Other Ambulatory Visit: Payer: Self-pay | Admitting: Family Medicine

## 2012-05-02 DIAGNOSIS — Z1231 Encounter for screening mammogram for malignant neoplasm of breast: Secondary | ICD-10-CM

## 2012-05-29 ENCOUNTER — Ambulatory Visit
Admission: RE | Admit: 2012-05-29 | Discharge: 2012-05-29 | Disposition: A | Discharge status: Home / Self Care | Payer: Medicare Other | Source: Ambulatory Visit | Attending: Family Medicine | Admitting: Family Medicine

## 2012-05-29 DIAGNOSIS — Z1231 Encounter for screening mammogram for malignant neoplasm of breast: Secondary | ICD-10-CM | POA: Diagnosis not present

## 2012-07-23 ENCOUNTER — Encounter: Payer: Self-pay | Admitting: Family Medicine

## 2012-07-23 ENCOUNTER — Ambulatory Visit (INDEPENDENT_AMBULATORY_CARE_PROVIDER_SITE_OTHER): Payer: Medicare Other | Admitting: Family Medicine

## 2012-07-23 VITALS — BP 132/85 | HR 78 | Temp 98.5°F | Ht 63.0 in | Wt 159.5 lb

## 2012-07-23 DIAGNOSIS — I10 Essential (primary) hypertension: Secondary | ICD-10-CM

## 2012-07-23 DIAGNOSIS — M25559 Pain in unspecified hip: Secondary | ICD-10-CM

## 2012-07-23 DIAGNOSIS — K219 Gastro-esophageal reflux disease without esophagitis: Secondary | ICD-10-CM | POA: Diagnosis not present

## 2012-07-23 DIAGNOSIS — M25552 Pain in left hip: Secondary | ICD-10-CM

## 2012-07-23 NOTE — Assessment & Plan Note (Signed)
?   Poss bursitis and pt sites leg length discrepency  Will f/u with Dr Lorelei Pont

## 2012-07-23 NOTE — Progress Notes (Signed)
Subjective:    Patient ID: Martha Ortega, female    DOB: February 26, 1935, 78 y.o.   MRN: [id number]  HPI Here for f/u of chronic medical problem  Is doing ok overall  Arthritis aches and pains are the hardest thing to deal with  Does not use a cane - but hip bothers her a bit when she walks  Not ready to investigate it yet  She does have one leg shorter than the other - wonders if that is the problem   Wt is stable She eats a healthy diet  Her wt at home is 5 lb less   Exercise- she walks at the mall/ also walks stairs at home Does a lot of stretches   bp is up on first check today bp at home is 120s/80s No cp or palpitations or headaches or edema  No side effects to medicines  BP Readings from Last 3 Encounters:  07/23/12 158/90  01/23/12 138/85  12/12/11 120/68       Chemistry      Component Value Date/Time   NA 142 12/12/2011 1218   K 4.2 12/12/2011 1218   CL 108 12/12/2011 1218   CO2 28 12/12/2011 1218   BUN 13 12/12/2011 1218   CREATININE 1.2 12/12/2011 1218      Component Value Date/Time   CALCIUM 9.3 12/12/2011 1218   ALKPHOS 86 12/12/2011 1218   AST 18 12/12/2011 1218   ALT 16 12/12/2011 1218   BILITOT 0.9 12/12/2011 1218       GERD- unable to take PPI due to allergy Does not need any med at all -no symptoms any more if she watches her diet  Patient Active Problem List  Diagnosis  . HYPERLIPIDEMIA  . HYPERTENSION  . POSTNASAL DRIP SYNDROME  . ALLERGIC RHINITIS  . GERD  . RENAL INSUFFICIENCY  . DIVERTICULITIS, HX OF  . GERD (gastroesophageal reflux disease)  . Pain in left hip   Past Medical History  Diagnosis Date  . History of diverticulitis of colon   . Hyperlipidemia   . Hypertension   . Allergic rhinitis   . Hyperglycemia 09    mild  . Abnormal blood findings     baseline cr 1.3, watching for renal insufficiency   Past Surgical History  Procedure Laterality Date  . Appendectomy    . Abdominal hysterectomy    . Cystectomy  93    R axilla   . Cystectomy  97    R breast  . Cardiovascular stress test  07/21/04  . Barium swallow  10/18/03   History  Substance Use Topics  . Smoking status: Former Smoker    Quit date: 04/25/1978  . Smokeless tobacco: Not on file  . Alcohol Use: No   Family History  Problem Relation Age of Onset  . Arthritis Mother   . Arthritis Sister   . Lupus Sister     ?  . Diabetes Sister   . Prostate cancer Brother   . Stroke Father   . Hypertension Father     ?   Allergies  Allergen Reactions  . Amlodipine Swelling    Throat and mouth swelling  . Fluticasone Propionate     REACTION: does not help  . Loratadine     REACTION: does not work  . Lovastatin     REACTION: was not effective  . Omeprazole Swelling    Throat, lips, and mouth swelling  . Rosuvastatin     REACTION: GI side eff  .  Zocor (Simvastatin - High Dose)     Mouth and throat swelled   No current outpatient prescriptions on file prior to visit.   No current facility-administered medications on file prior to visit.    Review of Systems Review of Systems  Constitutional: Negative for fever, appetite change, fatigue and unexpected weight change.  Eyes: Negative for pain and visual disturbance.  Respiratory: Negative for cough and shortness of breath.   Cardiovascular: Negative for cp or palpitations    Gastrointestinal: Negative for nausea, diarrhea and constipation. neg for heartburn or abd pain  Genitourinary: Negative for urgency and frequency.  Skin: Negative for pallor or rash   MSK pos for hip pain and intermittent neck pain  Neurological: Negative for weakness, light-headedness, numbness and headaches.  Hematological: Negative for adenopathy. Does not bruise/bleed easily.  Psychiatric/Behavioral: Negative for dysphoric mood. The patient is not nervous/anxious.         Objective:   Physical Exam  Constitutional: She appears well-developed and well-nourished. No distress.  HENT:  Head: Normocephalic and  atraumatic.  Mouth/Throat: Oropharynx is clear and moist.  Eyes: Conjunctivae and EOM are normal. Pupils are equal, round, and reactive to light. No scleral icterus.  Neck: Normal range of motion. Neck supple. No JVD present. Carotid bruit is not present. No thyromegaly present.  Cardiovascular: Normal rate, regular rhythm, normal heart sounds and intact distal pulses.  Exam reveals no gallop.   Pulmonary/Chest: Effort normal and breath sounds normal. No respiratory distress. She has no wheezes. She has no rales.  Abdominal: Soft. Bowel sounds are normal. She exhibits no distension, no abdominal bruit and no mass. There is no tenderness.  Musculoskeletal: She exhibits tenderness. She exhibits no edema.  Tender L greater trochanter Nl rom hip and neg SLR No joint swelling   Lymphadenopathy:    She has no cervical adenopathy.  Neurological: She is alert. She has normal reflexes. No cranial nerve deficit. She exhibits normal muscle tone. Coordination normal.  Skin: Skin is warm and dry. No rash noted. No erythema. No pallor.  Psychiatric: She has a normal mood and affect.          Assessment & Plan:

## 2012-07-23 NOTE — Patient Instructions (Addendum)
For pain you can take acetaminophen as directed  Make appt on the way out with Dr Lorelei Pont for hip pain  Take care of yourself  Blood pressure is stable - bring cuff to each appt  Follow up in 6 months for annual exam with labs prior

## 2012-07-23 NOTE — Assessment & Plan Note (Signed)
No medication currently-some white coat component , better on 2nd check today 120s/80s reliably at home Will continue to follow  Disc good health habits

## 2012-07-23 NOTE — Assessment & Plan Note (Signed)
No more problems with this at all - with good diet  Intol of PPI

## 2012-07-25 ENCOUNTER — Encounter: Payer: Self-pay | Admitting: Family Medicine

## 2012-07-25 ENCOUNTER — Ambulatory Visit (INDEPENDENT_AMBULATORY_CARE_PROVIDER_SITE_OTHER): Payer: Medicare Other | Admitting: Family Medicine

## 2012-07-25 VITALS — BP 140/90 | HR 91 | Temp 97.7°F | Ht 63.0 in | Wt 159.5 lb

## 2012-07-25 DIAGNOSIS — M2170 Unequal limb length (acquired), unspecified site: Secondary | ICD-10-CM

## 2012-07-25 DIAGNOSIS — M67952 Unspecified disorder of synovium and tendon, left thigh: Secondary | ICD-10-CM

## 2012-07-25 DIAGNOSIS — M76899 Other specified enthesopathies of unspecified lower limb, excluding foot: Secondary | ICD-10-CM | POA: Diagnosis not present

## 2012-07-25 NOTE — Progress Notes (Signed)
Therapist, music at [facility] [place] [address] Phone: [phone] Fax: [phone]  Date:  07/25/2012   Name:  Martha Ortega   DOB:  [birth year]   MRN:  [id number] Gender: female Age: 78 y.o.  Primary Physician:  Loura Pardon, MD  Evaluating MD: Owens Loffler, MD   Chief Complaint: Leg Pain and Hip Pain   History of Present Illness:  Martha Ortega is a 78 y.o. pleasant patient who presents with the following:  Dr. Glori Bickers asked me to evaluate for left hip pain:  Left posterior buttocks pain, and pain with sitting.  LL discrepancy - noticed in the 1970's  No back pain  R side shorter   Very pleasant patient he 1st noticed it she had a leg length discrepancy in the 1970s. Recently over the past few months, she has had some pain in the posterior and lateral aspect of her LEFT posterior buttocks. She denies any back pain. She denies any radiculopathy. She denies any numbness or tingling. She denies any bowel or bladder incontinence. She denies any thigh pain. She denies any knee pain.  She does have some posterior lower pain as well  Patient Active Problem List  Diagnosis  . HYPERLIPIDEMIA  . HYPERTENSION  . POSTNASAL DRIP SYNDROME  . ALLERGIC RHINITIS  . GERD  . RENAL INSUFFICIENCY  . DIVERTICULITIS, HX OF  . GERD (gastroesophageal reflux disease)  . Pain in left hip    Past Medical History  Diagnosis Date  . History of diverticulitis of colon   . Hyperlipidemia   . Hypertension   . Allergic rhinitis   . Hyperglycemia 09    mild  . Abnormal blood findings     baseline cr 1.3, watching for renal insufficiency    Past Surgical History  Procedure Laterality Date  . Appendectomy    . Abdominal hysterectomy    . Cystectomy  93    R axilla  . Cystectomy  97    R breast  . Cardiovascular stress test  07/21/04  . Barium swallow  10/18/03    History   Social History  . Marital Status: Widowed    Spouse Name: N/A    Number  of Children: N/A  . Years of Education: N/A   Occupational History  . unemployed    Social History Main Topics  . Smoking status: Former Smoker    Quit date: 04/25/1978  . Smokeless tobacco: Not on file  . Alcohol Use: No  . Drug Use: No  . Sexually Active: Not on file   Other Topics Concern  . Not on file   Social History Narrative   Widowed, not working, son lives close by   Regular exercise: walking videos 3-4 times weekly             Family History  Problem Relation Age of Onset  . Arthritis Mother   . Arthritis Sister   . Lupus Sister     ?  . Diabetes Sister   . Prostate cancer Brother   . Stroke Father   . Hypertension Father     ?    Allergies  Allergen Reactions  . Amlodipine Swelling    Throat and mouth swelling  . Fluticasone Propionate     REACTION: does not help  . Loratadine     REACTION: does not work  . Lovastatin     REACTION: was not effective  . Omeprazole Swelling    Throat, lips,  and mouth swelling  . Rosuvastatin     REACTION: GI side eff  . Zocor (Simvastatin - High Dose)     Mouth and throat swelled    Medication list has been reviewed and updated.  No outpatient prescriptions prior to visit.   No facility-administered medications prior to visit.    Review of Systems:   GEN: No fevers, chills. Nontoxic. Primarily MSK c/o today. MSK: Detailed in the HPI GI: tolerating PO intake without difficulty Neuro: No numbness, parasthesias, or tingling associated. Otherwise the pertinent positives of the ROS are noted above.    Physical Examination: BP 140/90  Pulse 91  Temp(Src) 97.7 F (36.5 C) (Oral)  Ht 5\' 3"  (1.6 m)  Wt 159 lb 8 oz (72.349 kg)  BMI 28.26 kg/m2  SpO2 99%  Ideal Body Weight: Weight in (lb) to have BMI = 25: 140.8   GEN: WDWN, NAD, Non-toxic, Alert & Oriented x 3 HEENT: Atraumatic, Normocephalic.  Ears and Nose: No external deformity. EXTR: No clubbing/cyanosis/edema NEURO: Normal gait.  PSYCH:  Normally interactive. Conversant. Not depressed or anxious appearing.  Calm demeanor.   HIP EXAM: SIDE: LEFT ROM: Abduction, Flexion, Internal and External range of motion: excellent Pain with terminal IROM and EROM: no GTB: NT SLR: NEG Knees: No effusion FABER: NT REVERSE FABER: some pain Piriformis: NT at direct palpation Str: flexion: 5/5 abduction: 4/5 on L, 5/5 on R adduction: 5/5 Strength testing non-tender  The patient is most tender on the posterior pelvic rim on the LEFT, and she is also tender at the ischial tuberosity on the LEFT as well.  Leg weights are notably different with a RIGHT leg being approximately 1.5 cm shorter than the LEFT.   Assessment and Plan: Tendinopathy of left gluteus medius  Leg length discrepancy   >25 minutes spent in face to face time with patient, >50% spent in counselling or coordination of care: reviewed pelvic and hip anatomy with the patient and reviewed hip rehabilitation program from the [employer].  Primary problem is one of leg length discrepancy and secondary decreased strength in hip abduction and gluteus medius tendinopathy. She may have some R. Sided septic ischial tuberosity as well, but this is also likely a secondary phenomenon. This is a very difficult area to inject, so I would not favor doing this initially.  I made a right-sided leg LEFT for the patient out of orthopedic felt and placed in some sports insults.  I'm going to have her followup in 4-6 weeks, and I plan to make her some custom orthotics with a more permanent lift.  Signed, Maud Deed. Stelios Kirby, MD 07/25/2012 9:04 AM

## 2012-07-25 NOTE — Patient Instructions (Signed)
F/u Dr. Lorelei Pont in 6 weeks. 45 minute appt. At either 8 AM or 2 PM. Orthotics.

## 2012-10-03 ENCOUNTER — Encounter: Payer: Self-pay | Admitting: Family Medicine

## 2012-10-03 ENCOUNTER — Ambulatory Visit (INDEPENDENT_AMBULATORY_CARE_PROVIDER_SITE_OTHER): Payer: Medicare Other | Admitting: Family Medicine

## 2012-10-03 VITALS — BP 140/94 | HR 83 | Temp 97.9°F | Ht 63.0 in | Wt 161.8 lb

## 2012-10-03 DIAGNOSIS — M6790 Unspecified disorder of synovium and tendon, unspecified site: Secondary | ICD-10-CM | POA: Diagnosis not present

## 2012-10-03 DIAGNOSIS — M2170 Unequal limb length (acquired), unspecified site: Secondary | ICD-10-CM | POA: Diagnosis not present

## 2012-10-03 DIAGNOSIS — M67952 Unspecified disorder of synovium and tendon, left thigh: Secondary | ICD-10-CM

## 2012-10-03 NOTE — Progress Notes (Signed)
Therapist, music at [facility] [place] [address] Phone: [phone] Fax: [phone]  Date:  10/03/2012   Name:  Martha Ortega   DOB:  05-22-1934   MRN:  [id number] Gender: female Age: 78 y.o.  Primary Physician:  Martha Pardon, MD  Evaluating MD: Martha Loffler, MD   Chief Complaint: Follow-up   History of Present Illness:  Martha Ortega is a 78 y.o. pleasant patient who presents with the following:  Hip pain and leg length discrepancy.  R leg 1.5 cm shorter  L lateral and posterior buttocks pain continues, but it is sometimes improved. She has been wearing her lift that I made last time with orthopedic felt. No radiculopathy.  Patient Active Problem List   Diagnosis Date Noted  . Pain in left hip 07/23/2012  . GERD (gastroesophageal reflux disease) 12/12/2011  . RENAL INSUFFICIENCY 11/26/2008  . ALLERGIC RHINITIS 09/10/2007  . HYPERLIPIDEMIA 09/08/2006  . HYPERTENSION 09/08/2006  . POSTNASAL DRIP SYNDROME 09/08/2006  . GERD 09/08/2006  . DIVERTICULITIS, HX OF 09/08/2006    Past Medical History  Diagnosis Date  . History of diverticulitis of colon   . Hyperlipidemia   . Hypertension   . Allergic rhinitis   . Hyperglycemia 09    mild  . Abnormal blood findings     baseline cr 1.3, watching for renal insufficiency    Past Surgical History  Procedure Laterality Date  . Appendectomy    . Abdominal hysterectomy    . Cystectomy  93    R axilla  . Cystectomy  97    R breast  . Cardiovascular stress test  07/21/04  . Barium swallow  10/18/03    History   Social History  . Marital Status: Widowed    Spouse Name: N/A    Number of Children: N/A  . Years of Education: N/A   Occupational History  . unemployed    Social History Main Topics  . Smoking status: Former Smoker    Quit date: 04/25/1978  . Smokeless tobacco: Not on file  . Alcohol Use: No  . Drug Use: No  . Sexually Active: Not on file   Other Topics  Concern  . Not on file   Social History Narrative   Widowed, not working, son lives close by   Regular exercise: walking videos 3-4 times weekly             Family History  Problem Relation Age of Onset  . Arthritis Mother   . Arthritis Sister   . Lupus Sister     ?  . Diabetes Sister   . Prostate cancer Brother   . Stroke Father   . Hypertension Father     ?    Allergies  Allergen Reactions  . Amlodipine Swelling    Throat and mouth swelling  . Fluticasone Propionate     REACTION: does not help  . Loratadine     REACTION: does not work  . Lovastatin     REACTION: was not effective  . Omeprazole Swelling    Throat, lips, and mouth swelling  . Rosuvastatin     REACTION: GI side eff  . Zocor (Simvastatin - High Dose)     Mouth and throat swelled    Medication list has been reviewed and updated.  No outpatient prescriptions prior to visit.   No facility-administered medications prior to visit.    Review of Systems:   GEN: No fevers, chills. Nontoxic. Primarily  MSK c/o today. MSK: Detailed in the HPI GI: tolerating PO intake without difficulty Neuro: No numbness, parasthesias, or tingling associated. Otherwise the pertinent positives of the ROS are noted above.    Physical Examination: BP 140/94  Pulse 83  Temp(Src) 97.9 F (36.6 C) (Oral)  Ht 5\' 3"  (1.6 m)  Wt 161 lb 12 oz (73.369 kg)  BMI 28.66 kg/m2  SpO2 99%  Ideal Body Weight: Weight in (lb) to have BMI = 25: 140.8   GEN: WDWN, NAD, Non-toxic, Alert & Oriented x 3 HEENT: Atraumatic, Normocephalic.  Ears and Nose: No external deformity. EXTR: No clubbing/cyanosis/edema NEURO: Normal gait.  PSYCH: Normally interactive. Conversant. Not depressed or anxious appearing.  Calm demeanor.   HIP EXAM: SIDE: B ROM: Abduction, Flexion, Internal and External range of motion: full Pain with terminal IROM and EROM: no GTB: NT TTP L pelvic rim SLR: NEG Knees: No effusion FABER: NT REVERSE FABER:  NT, neg Piriformis: NT at direct palpation Str: flexion: 5/5 abduction: 4/5 on the L adduction: 5/5 Strength testing non-tender     Assessment and Plan: Tendinopathy of left gluteus medius  Leg length discrepancy   I looked at all 5 of her shoes she brought in, and I don't think any could accomodate one of my custom orthotics. Reviewed hip abd rehab.  Add 8 mm high density poron lift and custom ground down for fit.  Signed, Maud Deed. Caralynn Gelber, MD 10/03/2012 1:59 PM

## 2012-11-29 DIAGNOSIS — M4120 Other idiopathic scoliosis, site unspecified: Secondary | ICD-10-CM | POA: Diagnosis not present

## 2012-11-29 DIAGNOSIS — M5430 Sciatica, unspecified side: Secondary | ICD-10-CM | POA: Diagnosis not present

## 2012-11-29 DIAGNOSIS — M169 Osteoarthritis of hip, unspecified: Secondary | ICD-10-CM | POA: Diagnosis not present

## 2012-12-04 DIAGNOSIS — M5430 Sciatica, unspecified side: Secondary | ICD-10-CM | POA: Diagnosis not present

## 2012-12-04 DIAGNOSIS — M48061 Spinal stenosis, lumbar region without neurogenic claudication: Secondary | ICD-10-CM | POA: Diagnosis not present

## 2013-01-07 DIAGNOSIS — M5430 Sciatica, unspecified side: Secondary | ICD-10-CM | POA: Diagnosis not present

## 2013-01-09 DIAGNOSIS — R262 Difficulty in walking, not elsewhere classified: Secondary | ICD-10-CM | POA: Diagnosis not present

## 2013-01-09 DIAGNOSIS — M5430 Sciatica, unspecified side: Secondary | ICD-10-CM | POA: Diagnosis not present

## 2013-01-13 ENCOUNTER — Telehealth: Payer: Self-pay | Admitting: Family Medicine

## 2013-01-13 DIAGNOSIS — I10 Essential (primary) hypertension: Secondary | ICD-10-CM

## 2013-01-13 DIAGNOSIS — E785 Hyperlipidemia, unspecified: Secondary | ICD-10-CM

## 2013-01-13 NOTE — Telephone Encounter (Signed)
Message copied by [name] on Sun Jan 13, 2013  2:33 PM ------      Message from: Ellamae Sia      Created: Tue Jan 08, 2013 11:40 AM      Regarding: Lab orders for Monday, 9.22.14       Labs for a 6 month f/u ------

## 2013-01-14 ENCOUNTER — Other Ambulatory Visit (INDEPENDENT_AMBULATORY_CARE_PROVIDER_SITE_OTHER): Payer: Medicare Other

## 2013-01-14 DIAGNOSIS — R262 Difficulty in walking, not elsewhere classified: Secondary | ICD-10-CM | POA: Diagnosis not present

## 2013-01-14 DIAGNOSIS — I10 Essential (primary) hypertension: Secondary | ICD-10-CM

## 2013-01-14 DIAGNOSIS — M5430 Sciatica, unspecified side: Secondary | ICD-10-CM | POA: Diagnosis not present

## 2013-01-14 DIAGNOSIS — E785 Hyperlipidemia, unspecified: Secondary | ICD-10-CM | POA: Diagnosis not present

## 2013-01-14 LAB — COMPREHENSIVE METABOLIC PANEL
ALT: 12 U/L (ref 0–35)
AST: 18 U/L (ref 0–37)
Albumin: 3.7 g/dL (ref 3.5–5.2)
CO2: 29 mEq/L (ref 19–32)
Calcium: 9 mg/dL (ref 8.4–10.5)
Chloride: 110 mEq/L (ref 96–112)
Creatinine, Ser: 1.4 mg/dL — ABNORMAL HIGH (ref 0.4–1.2)
GFR: 47.44 mL/min — ABNORMAL LOW (ref >60.00)
Potassium: 4.3 mEq/L (ref 3.5–5.1)

## 2013-01-14 LAB — CBC WITH DIFFERENTIAL/PLATELET
Basophils Absolute: 0 10*3/uL (ref 0.0–0.1)
Basophils Relative: 0.7 % (ref 0.0–3.0)
Eosinophils Absolute: 0.6 10*3/uL (ref 0.0–0.7)
Hemoglobin: 13.3 g/dL (ref 12.0–15.0)
Lymphocytes Relative: 53.4 % — ABNORMAL HIGH (ref 12.0–46.0)
MCHC: 33.8 g/dL (ref 30.0–36.0)
Monocytes Relative: 6.4 % (ref 3.0–12.0)
Neutro Abs: 2 10*3/uL (ref 1.4–7.7)
Neutrophils Relative %: 30.4 % — ABNORMAL LOW (ref 43.0–77.0)
RBC: 4.59 Mil/uL (ref 3.87–5.11)
RDW: 13.8 % (ref 11.5–14.6)

## 2013-01-14 LAB — TSH: TSH: 1.41 u[IU]/mL (ref 0.35–5.50)

## 2013-01-14 LAB — LDL CHOLESTEROL, DIRECT: Direct LDL: 172.8 mg/dL

## 2013-01-15 DIAGNOSIS — H2510 Age-related nuclear cataract, unspecified eye: Secondary | ICD-10-CM | POA: Diagnosis not present

## 2013-01-18 DIAGNOSIS — R262 Difficulty in walking, not elsewhere classified: Secondary | ICD-10-CM | POA: Diagnosis not present

## 2013-01-18 DIAGNOSIS — M5430 Sciatica, unspecified side: Secondary | ICD-10-CM | POA: Diagnosis not present

## 2013-01-21 ENCOUNTER — Encounter: Payer: Self-pay | Admitting: Family Medicine

## 2013-01-21 ENCOUNTER — Ambulatory Visit (INDEPENDENT_AMBULATORY_CARE_PROVIDER_SITE_OTHER): Payer: Medicare Other | Admitting: Family Medicine

## 2013-01-21 VITALS — BP 146/96 | HR 81 | Temp 98.2°F | Ht 63.0 in | Wt 162.8 lb

## 2013-01-21 DIAGNOSIS — I10 Essential (primary) hypertension: Secondary | ICD-10-CM | POA: Diagnosis not present

## 2013-01-21 DIAGNOSIS — M5430 Sciatica, unspecified side: Secondary | ICD-10-CM | POA: Diagnosis not present

## 2013-01-21 DIAGNOSIS — N259 Disorder resulting from impaired renal tubular function, unspecified: Secondary | ICD-10-CM | POA: Diagnosis not present

## 2013-01-21 DIAGNOSIS — R262 Difficulty in walking, not elsewhere classified: Secondary | ICD-10-CM | POA: Diagnosis not present

## 2013-01-21 DIAGNOSIS — Z23 Encounter for immunization: Secondary | ICD-10-CM

## 2013-01-21 DIAGNOSIS — E785 Hyperlipidemia, unspecified: Secondary | ICD-10-CM | POA: Diagnosis not present

## 2013-01-21 LAB — POCT URINALYSIS DIPSTICK
Glucose, UA: NEGATIVE
Nitrite, UA: NEGATIVE
Protein, UA: NEGATIVE
Spec Grav, UA: 1.01
Urobilinogen, UA: 0.2

## 2013-01-21 LAB — POCT UA - MICROSCOPIC ONLY
RBC, urine, microscopic: 0
WBC, Ur, HPF, POC: 0
Yeast, UA: 0

## 2013-01-21 NOTE — Progress Notes (Signed)
Subjective:    Patient ID: Martha Ortega Comment, female    DOB: [birth year], 78 y.o.   MRN: [id number]  HPI Here for f/u of chronic health problems   bp is stable today (she tends to have white coat HTN) No cp or palpitations or headaches or edema  No medicines   BP Readings from Last 3 Encounters:  01/21/13 146/96  10/03/12 140/94  07/25/12 140/90  at home - it runs much better - 120s/70s    Wt is stable with bmi of 28 Off track with diet and exercise - had to take care of sick son all summer  He has colitis - and it is difficult to control   Renal insuff   Chemistry      Component Value Date/Time   NA 142 01/14/2013 0912   K 4.3 01/14/2013 0912   CL 110 01/14/2013 0912   CO2 29 01/14/2013 0912   BUN 12 01/14/2013 0912   CREATININE 1.4* 01/14/2013 0912      Component Value Date/Time   CALCIUM 9.0 01/14/2013 0912   ALKPHOS 73 01/14/2013 0912   AST 18 01/14/2013 0912   ALT 12 01/14/2013 0912   BILITOT 0.5 01/14/2013 0912     cr is up from 1.3  Water intake - she thinks is quite good (this summer not as good) avg 6-8 water servings per day  On no meds No dysuria  occ frequency  No fever or nausea   Patient Active Problem List   Diagnosis Date Noted  . Pain in left hip 07/23/2012  . GERD (gastroesophageal reflux disease) 12/12/2011  . RENAL INSUFFICIENCY 11/26/2008  . ALLERGIC RHINITIS 09/10/2007  . HYPERLIPIDEMIA 09/08/2006  . HYPERTENSION 09/08/2006  . POSTNASAL DRIP SYNDROME 09/08/2006  . GERD 09/08/2006  . DIVERTICULITIS, HX OF 09/08/2006   Past Medical History  Diagnosis Date  . History of diverticulitis of colon   . Hyperlipidemia   . Hypertension   . Allergic rhinitis   . Hyperglycemia 09    mild  . Abnormal blood findings     baseline cr 1.3, watching for renal insufficiency   Past Surgical History  Procedure Laterality Date  . Appendectomy    . Abdominal hysterectomy    . Cystectomy  93    R axilla  . Cystectomy  97    R breast  . Cardiovascular  stress test  07/21/04  . Barium swallow  10/18/03   History  Substance Use Topics  . Smoking status: Former Smoker    Quit date: 04/25/1978  . Smokeless tobacco: Not on file  . Alcohol Use: No   Family History  Problem Relation Age of Onset  . Arthritis Mother   . Arthritis Sister   . Lupus Sister     ?  . Diabetes Sister   . Prostate cancer Brother   . Stroke Father   . Hypertension Father     ?   Allergies  Allergen Reactions  . Amlodipine Swelling    Throat and mouth swelling  . Fluticasone Propionate     REACTION: does not help  . Loratadine     REACTION: does not work  . Lovastatin     REACTION: was not effective  . Omeprazole Swelling    Throat, lips, and mouth swelling  . Rosuvastatin     REACTION: GI side eff  . Zocor [Simvastatin - High Dose]     Mouth and throat swelled   No current outpatient prescriptions on file prior to  visit.   No current facility-administered medications on file prior to visit.      Hyperlipidemia  Diet Is all to statins- allergic rxn Lab Results  Component Value Date   CHOL 238* 01/14/2013   CHOL 217* 12/12/2011   CHOL 238* 06/10/2011   Lab Results  Component Value Date   HDL 35.70* 01/14/2013   HDL 41.20 12/12/2011   HDL 37.80* 06/10/2011   Lab Results  Component Value Date   LDLCALC 92 11/25/2010   LDLCALC 88 05/17/2010   LDLCALC 109* 09/14/2009   Lab Results  Component Value Date   TRIG 194.0* 01/14/2013   TRIG 119.0 12/12/2011   TRIG 133.0 06/10/2011   Lab Results  Component Value Date   CHOLHDL 7 01/14/2013   CHOLHDL 5 12/12/2011   CHOLHDL 6 06/10/2011   Lab Results  Component Value Date   LDLDIRECT 172.8 01/14/2013   LDLDIRECT 158.1 12/12/2011   LDLDIRECT 179.5 06/10/2011    She did cut out majority of high cholesterol foods - but just not as good as usual this summer  She does plan to start exercise again (was going to the mall to walk)    Patient Active Problem List   Diagnosis Date Noted  . Pain in left hip  07/23/2012  . GERD (gastroesophageal reflux disease) 12/12/2011  . RENAL INSUFFICIENCY 11/26/2008  . ALLERGIC RHINITIS 09/10/2007  . HYPERLIPIDEMIA 09/08/2006  . HYPERTENSION 09/08/2006  . POSTNASAL DRIP SYNDROME 09/08/2006  . GERD 09/08/2006  . DIVERTICULITIS, HX OF 09/08/2006   Past Medical History  Diagnosis Date  . History of diverticulitis of colon   . Hyperlipidemia   . Hypertension   . Allergic rhinitis   . Hyperglycemia 09    mild  . Abnormal blood findings     baseline cr 1.3, watching for renal insufficiency   Past Surgical History  Procedure Laterality Date  . Appendectomy    . Abdominal hysterectomy    . Cystectomy  93    R axilla  . Cystectomy  97    R breast  . Cardiovascular stress test  07/21/04  . Barium swallow  10/18/03   History  Substance Use Topics  . Smoking status: Former Smoker    Quit date: 04/25/1978  . Smokeless tobacco: Not on file  . Alcohol Use: No   Family History  Problem Relation Age of Onset  . Arthritis Mother   . Arthritis Sister   . Lupus Sister     ?  . Diabetes Sister   . Prostate cancer Brother   . Stroke Father   . Hypertension Father     ?   Allergies  Allergen Reactions  . Amlodipine Swelling    Throat and mouth swelling  . Fluticasone Propionate     REACTION: does not help  . Loratadine     REACTION: does not work  . Lovastatin     REACTION: was not effective  . Omeprazole Swelling    Throat, lips, and mouth swelling  . Rosuvastatin     REACTION: GI side eff  . Zocor [Simvastatin - High Dose]     Mouth and throat swelled   No current outpatient prescriptions on file prior to visit.   No current facility-administered medications on file prior to visit.    Review of Systems Review of Systems  Constitutional: Negative for fever, appetite change, fatigue and unexpected weight change.  Eyes: Negative for pain and visual disturbance.  Respiratory: Negative for cough and shortness of breath.  Cardiovascular: Negative for cp or palpitations    Gastrointestinal: Negative for nausea, diarrhea and constipation.  Genitourinary: Negative for urgency and frequency.  Skin: Negative for pallor or rash   Neurological: Negative for weakness, light-headedness, numbness and headaches.  Hematological: Negative for adenopathy. Does not bruise/bleed easily.  Psychiatric/Behavioral: Negative for dysphoric mood. The patient is not nervous/anxious.         Objective:   Physical Exam  Constitutional: She appears well-developed and well-nourished. No distress.  overwt and well app  HENT:  Head: Normocephalic and atraumatic.  Right Ear: External ear normal.  Left Ear: External ear normal.  Nose: Nose normal.  Mouth/Throat: Oropharynx is clear and moist.  Eyes: Conjunctivae and EOM are normal. Pupils are equal, round, and reactive to light. Right eye exhibits no discharge. Left eye exhibits no discharge. No scleral icterus.  Neck: Normal range of motion. Neck supple. No JVD present. Carotid bruit is not present. No thyromegaly present.  Cardiovascular: Normal rate, regular rhythm, normal heart sounds and intact distal pulses.  Exam reveals no gallop.   Pulmonary/Chest: Effort normal and breath sounds normal. No respiratory distress. She has no wheezes. She exhibits no tenderness.  Abdominal: Soft. Bowel sounds are normal. She exhibits no distension, no abdominal bruit and no mass. There is no tenderness.  Musculoskeletal: She exhibits no edema and no tenderness.  Lymphadenopathy:    She has no cervical adenopathy.  Neurological: She is alert. She has normal reflexes. No cranial nerve deficit. She exhibits normal muscle tone. Coordination normal.  Skin: Skin is warm and dry. No rash noted. No erythema. No pallor.  Psychiatric: She has a normal mood and affect.          Assessment & Plan:

## 2013-01-21 NOTE — Assessment & Plan Note (Signed)
Disc goals for lipids and reasons to control them Rev labs with pt Rev low sat fat diet in detail This increased / diet not as good this summer  She cannot take statins We may consider lipid clinic referral

## 2013-01-21 NOTE — Assessment & Plan Note (Signed)
bp is up today- pt generally has white coat syndrome  Repeat today was not imp  Pt will f/u in 2-3 wk with her home cuff and we will begin tx if necessary Disc lifestyle habits and DASH diet

## 2013-01-21 NOTE — Patient Instructions (Addendum)
Our blood pressure is higher than usual today-work on diet and exercise and follow up in 2-3 weeks with your home cuff so we can test it  Your sugar level is slightly elevated (watch sugar in diet and we will follow this )  Cholesterol is up - (Avoid red meat/ fried foods/ egg yolks/ fatty breakfast meats/ butter, cheese and high fat dairy/ and shellfish  )  Make sure to drink lots of water for kidney health and we will have you leave a urine sample on the way out also - we may need to have you see a kidney specialist in the future  Flu shot today

## 2013-01-21 NOTE — Assessment & Plan Note (Signed)
Cr is up to 1.4 Nl ua today and no symptoms  bp is up  Enc better fluid intake Will re check at f/iu 2-3 wk and consider ref to renal/ further eval if not improved

## 2013-01-25 DIAGNOSIS — R262 Difficulty in walking, not elsewhere classified: Secondary | ICD-10-CM | POA: Diagnosis not present

## 2013-01-25 DIAGNOSIS — M5430 Sciatica, unspecified side: Secondary | ICD-10-CM | POA: Diagnosis not present

## 2013-01-28 DIAGNOSIS — M5430 Sciatica, unspecified side: Secondary | ICD-10-CM | POA: Diagnosis not present

## 2013-01-28 DIAGNOSIS — R262 Difficulty in walking, not elsewhere classified: Secondary | ICD-10-CM | POA: Diagnosis not present

## 2013-02-01 DIAGNOSIS — M5430 Sciatica, unspecified side: Secondary | ICD-10-CM | POA: Diagnosis not present

## 2013-02-01 DIAGNOSIS — R262 Difficulty in walking, not elsewhere classified: Secondary | ICD-10-CM | POA: Diagnosis not present

## 2013-02-04 DIAGNOSIS — R262 Difficulty in walking, not elsewhere classified: Secondary | ICD-10-CM | POA: Diagnosis not present

## 2013-02-04 DIAGNOSIS — M5430 Sciatica, unspecified side: Secondary | ICD-10-CM | POA: Diagnosis not present

## 2013-02-08 DIAGNOSIS — M5430 Sciatica, unspecified side: Secondary | ICD-10-CM | POA: Diagnosis not present

## 2013-02-08 DIAGNOSIS — R262 Difficulty in walking, not elsewhere classified: Secondary | ICD-10-CM | POA: Diagnosis not present

## 2013-02-11 ENCOUNTER — Encounter: Payer: Self-pay | Admitting: Family Medicine

## 2013-02-11 ENCOUNTER — Ambulatory Visit (INDEPENDENT_AMBULATORY_CARE_PROVIDER_SITE_OTHER): Payer: Medicare Other | Admitting: Family Medicine

## 2013-02-11 VITALS — BP 170/86 | HR 74 | Temp 98.3°F | Ht 63.0 in | Wt 163.0 lb

## 2013-02-11 DIAGNOSIS — R262 Difficulty in walking, not elsewhere classified: Secondary | ICD-10-CM | POA: Diagnosis not present

## 2013-02-11 DIAGNOSIS — I10 Essential (primary) hypertension: Secondary | ICD-10-CM

## 2013-02-11 DIAGNOSIS — M5430 Sciatica, unspecified side: Secondary | ICD-10-CM | POA: Diagnosis not present

## 2013-02-11 DIAGNOSIS — E785 Hyperlipidemia, unspecified: Secondary | ICD-10-CM

## 2013-02-11 DIAGNOSIS — N259 Disorder resulting from impaired renal tubular function, unspecified: Secondary | ICD-10-CM

## 2013-02-11 LAB — RENAL FUNCTION PANEL
Albumin: 4 g/dL (ref 3.5–5.2)
BUN: 18 mg/dL (ref 6–23)
CO2: 27 meq/L (ref 19–32)
Calcium: 9.2 mg/dL (ref 8.4–10.5)
Chloride: 106 meq/L (ref 96–112)
Creatinine, Ser: 1.3 mg/dL — ABNORMAL HIGH (ref 0.4–1.2)
GFR: 52.16 mL/min — ABNORMAL LOW
Glucose, Bld: 91 mg/dL (ref 70–99)
Phosphorus: 3.2 mg/dL (ref 2.3–4.6)
Potassium: 4.4 meq/L (ref 3.5–5.1)
Sodium: 140 meq/L (ref 135–145)

## 2013-02-11 MED ORDER — METOPROLOL SUCCINATE ER 50 MG PO TB24: 50.0000 mg | ORAL_TABLET | Freq: Every day | ORAL | Status: DC

## 2013-02-11 NOTE — Assessment & Plan Note (Signed)
Lab today- after inc in water intake and dec sodium Will also begin tx of HTN with metoprolol

## 2013-02-11 NOTE — Progress Notes (Signed)
Subjective:    Patient ID: Martha Ortega, female    DOB: 1934-10-21, 78 y.o.   MRN: [id number]  HPI Here for f/u of chronic medical issues   bp is stable today - had disc dash diet last visit  No cp or palpitations or headaches or edema  No medicines right now at all  BP Readings from Last 3 Encounters:  02/11/13 146/86  01/21/13 146/96  10/03/12 140/94      Her cuff is reading 176/86- that matches up with my 2nd measurement on L arm   At home 137/78 last night-running about the same  (at most 138/81) Wt is stable with bmi of 28    Chemistry      Component Value Date/Time   NA 142 01/14/2013 0912   K 4.3 01/14/2013 0912   CL 110 01/14/2013 0912   CO2 29 01/14/2013 0912   BUN 12 01/14/2013 0912   CREATININE 1.4* 01/14/2013 0912      Component Value Date/Time   CALCIUM 9.0 01/14/2013 0912   ALKPHOS 73 01/14/2013 0912   AST 18 01/14/2013 0912   ALT 12 01/14/2013 0912   BILITOT 0.5 01/14/2013 0912     Cr is up fro 1.2 to 1.4 Hx of renal insufficiency- so need to re check that today  She is trying to drink more water and minimize sodium  Patient Active Problem List   Diagnosis Date Noted  . Pain in left hip 07/23/2012  . GERD (gastroesophageal reflux disease) 12/12/2011  . RENAL INSUFFICIENCY 11/26/2008  . ALLERGIC RHINITIS 09/10/2007  . HYPERLIPIDEMIA 09/08/2006  . HYPERTENSION 09/08/2006  . POSTNASAL DRIP SYNDROME 09/08/2006  . GERD 09/08/2006  . DIVERTICULITIS, HX OF 09/08/2006   Past Medical History  Diagnosis Date  . History of diverticulitis of colon   . Hyperlipidemia   . Hypertension   . Allergic rhinitis   . Hyperglycemia 09    mild  . Abnormal blood findings     baseline cr 1.3, watching for renal insufficiency   Past Surgical History  Procedure Laterality Date  . Appendectomy    . Abdominal hysterectomy    . Cystectomy  93    R axilla  . Cystectomy  97    R breast  . Cardiovascular stress test  07/21/04  . Barium swallow  10/18/03   History   Substance Use Topics  . Smoking status: Former Smoker    Quit date: 04/25/1978  . Smokeless tobacco: Not on file  . Alcohol Use: No   Family History  Problem Relation Age of Onset  . Arthritis Mother   . Arthritis Sister   . Lupus Sister     ?  . Diabetes Sister   . Prostate cancer Brother   . Stroke Father   . Hypertension Father     ?   Allergies  Allergen Reactions  . Amlodipine Swelling    Throat and mouth swelling  . Fluticasone Propionate     REACTION: does not help  . Loratadine     REACTION: does not work  . Lovastatin     REACTION: was not effective  . Omeprazole Swelling    Throat, lips, and mouth swelling  . Rosuvastatin     REACTION: GI side eff  . Zocor [Simvastatin - High Dose]     Mouth and throat swelled   No current outpatient prescriptions on file prior to visit.   No current facility-administered medications on file prior to visit.    Review  of Systems Review of Systems  Constitutional: Negative for fever, appetite change, fatigue and unexpected weight change.  Eyes: Negative for pain and visual disturbance.  Respiratory: Negative for cough and shortness of breath.   Cardiovascular: Negative for cp or palpitations    Gastrointestinal: Negative for nausea, diarrhea and constipation.  Genitourinary: Negative for urgency and frequency.  Skin: Negative for pallor or rash   Neurological: Negative for weakness, light-headedness, numbness and headaches.  Hematological: Negative for adenopathy. Does not bruise/bleed easily.  Psychiatric/Behavioral: Negative for dysphoric mood. The patient is not nervous/anxious.         Objective:   Physical Exam  Constitutional: She appears well-developed and well-nourished. No distress.  HENT:  Head: Normocephalic and atraumatic.  Eyes: Conjunctivae and EOM are normal. Pupils are equal, round, and reactive to light.  Neck: Normal range of motion. Neck supple. No JVD present. Carotid bruit is not present. No  thyromegaly present.  Cardiovascular: Normal rate, regular rhythm and intact distal pulses.  Exam reveals no gallop.   Pulmonary/Chest: Effort normal and breath sounds normal. No respiratory distress. She has no wheezes.  Musculoskeletal: She exhibits no edema.  Lymphadenopathy:    She has no cervical adenopathy.  Neurological: She is alert. She has normal reflexes.  Skin: Skin is warm and dry. No rash noted.  Psychiatric: She has a normal mood and affect.          Assessment & Plan:

## 2013-02-11 NOTE — Assessment & Plan Note (Signed)
bp cuff seems accurate-pt has a degree of whitecoat HTN but still warrants tx - esp in light of renal insuff Will start metoprolol xl 50 mg Rev side eff - will update if low bp or pulse  F/u planned

## 2013-02-11 NOTE — Patient Instructions (Signed)
Labs today for repeat kidney test (your urine was clear)  Start metoprolol for blood pressure one pill daily If any side effects- stop it and let me know  Follow up with me in 4-6 weeks  Keep drinking lots of water and avoid sodium

## 2013-02-13 ENCOUNTER — Other Ambulatory Visit: Payer: Self-pay | Admitting: Family Medicine

## 2013-02-13 MED ORDER — METOPROLOL SUCCINATE ER 50 MG PO TB24: 50.0000 mg | ORAL_TABLET | Freq: Every day | ORAL | Status: DC

## 2013-02-13 NOTE — Telephone Encounter (Signed)
Received fax from [facility] saying pt wants 90 day supply sent to them, I called local pharmacy and cancelled the Rx we sent in on 02/11/13 and sent Rx to mail order pharmacy

## 2013-02-15 DIAGNOSIS — R262 Difficulty in walking, not elsewhere classified: Secondary | ICD-10-CM | POA: Diagnosis not present

## 2013-02-15 DIAGNOSIS — M5430 Sciatica, unspecified side: Secondary | ICD-10-CM | POA: Diagnosis not present

## 2013-02-19 DIAGNOSIS — R262 Difficulty in walking, not elsewhere classified: Secondary | ICD-10-CM | POA: Diagnosis not present

## 2013-02-19 DIAGNOSIS — M5430 Sciatica, unspecified side: Secondary | ICD-10-CM | POA: Diagnosis not present

## 2013-02-21 DIAGNOSIS — R262 Difficulty in walking, not elsewhere classified: Secondary | ICD-10-CM | POA: Diagnosis not present

## 2013-02-21 DIAGNOSIS — M5430 Sciatica, unspecified side: Secondary | ICD-10-CM | POA: Diagnosis not present

## 2013-03-04 DIAGNOSIS — IMO0002 Reserved for concepts with insufficient information to code with codable children: Secondary | ICD-10-CM | POA: Diagnosis not present

## 2013-03-25 ENCOUNTER — Ambulatory Visit (INDEPENDENT_AMBULATORY_CARE_PROVIDER_SITE_OTHER): Payer: Medicare Other | Admitting: Family Medicine

## 2013-03-25 ENCOUNTER — Encounter: Payer: Self-pay | Admitting: Family Medicine

## 2013-03-25 VITALS — BP 136/82 | HR 60 | Temp 97.9°F | Ht 63.0 in | Wt 165.0 lb

## 2013-03-25 DIAGNOSIS — I10 Essential (primary) hypertension: Secondary | ICD-10-CM

## 2013-03-25 DIAGNOSIS — N259 Disorder resulting from impaired renal tubular function, unspecified: Secondary | ICD-10-CM | POA: Diagnosis not present

## 2013-03-25 NOTE — Progress Notes (Signed)
Pre-visit discussion using our clinic review tool. No additional management support is needed unless otherwise documented below in the visit note.  

## 2013-03-25 NOTE — Patient Instructions (Signed)
Continue current medicines Blood pressure is improved  Follow up in 6 months for annual exam with labs prior

## 2013-03-25 NOTE — Assessment & Plan Note (Signed)
BP: 136/82 mmHg  This is improved bp in fair control at this time  No changes needed Disc lifstyle change with low sodium diet and exercise   Will continue low dose beta blocker F/u 6 mo

## 2013-03-25 NOTE — Progress Notes (Signed)
Subjective:    Patient ID: Martha Ortega, female    DOB: January 18, 1935, 78 y.o.   MRN: [id number]  HPI Here for f/u of HTN   Last visit started metoprolol low dose  She feels pretty good  Does not note any side effects overall  Pulse of 60   BP Readings from Last 3 Encounters:  03/25/13 136/82  02/11/13 170/86  01/21/13 146/96    Has renal insuff Stable last time   Chemistry      Component Value Date/Time   NA 140 02/11/2013 1428   K 4.4 02/11/2013 1428   CL 106 02/11/2013 1428   CO2 27 02/11/2013 1428   BUN 18 02/11/2013 1428   CREATININE 1.3* 02/11/2013 1428      Component Value Date/Time   CALCIUM 9.2 02/11/2013 1428   ALKPHOS 73 01/14/2013 0912   AST 18 01/14/2013 0912   ALT 12 01/14/2013 0912   BILITOT 0.5 01/14/2013 0912      She is drinking her water   Patient Active Problem List   Diagnosis Date Noted  . Pain in left hip 07/23/2012  . GERD (gastroesophageal reflux disease) 12/12/2011  . RENAL INSUFFICIENCY 11/26/2008  . ALLERGIC RHINITIS 09/10/2007  . HYPERLIPIDEMIA 09/08/2006  . HYPERTENSION 09/08/2006  . POSTNASAL DRIP SYNDROME 09/08/2006  . GERD 09/08/2006  . DIVERTICULITIS, HX OF 09/08/2006   Past Medical History  Diagnosis Date  . History of diverticulitis of colon   . Hyperlipidemia   . Hypertension   . Allergic rhinitis   . Hyperglycemia 09    mild  . Abnormal blood findings     baseline cr 1.3, watching for renal insufficiency   Past Surgical History  Procedure Laterality Date  . Appendectomy    . Abdominal hysterectomy    . Cystectomy  93    R axilla  . Cystectomy  97    R breast  . Cardiovascular stress test  07/21/04  . Barium swallow  10/18/03   History  Substance Use Topics  . Smoking status: Former Smoker    Quit date: 04/25/1978  . Smokeless tobacco: Not on file  . Alcohol Use: No   Family History  Problem Relation Age of Onset  . Arthritis Mother   . Arthritis Sister   . Lupus Sister     ?  . Diabetes Sister     . Prostate cancer Brother   . Stroke Father   . Hypertension Father     ?   Allergies  Allergen Reactions  . Amlodipine Swelling    Throat and mouth swelling  . Fluticasone Propionate     REACTION: does not help  . Loratadine     REACTION: does not work  . Lovastatin     REACTION: was not effective  . Omeprazole Swelling    Throat, lips, and mouth swelling  . Rosuvastatin     REACTION: GI side eff  . Zocor [Simvastatin - High Dose]     Mouth and throat swelled   Current Outpatient Prescriptions on File Prior to Visit  Medication Sig Dispense Refill  . metoprolol succinate (TOPROL-XL) 50 MG 24 hr tablet Take 1 tablet (50 mg total) by mouth daily. Take with or immediately following a meal.  90 tablet  1   No current facility-administered medications on file prior to visit.    Review of Systems Review of Systems  Constitutional: Negative for fever, appetite change, fatigue and unexpected weight change.  Eyes: Negative for pain and  visual disturbance.  Respiratory: Negative for cough and shortness of breath.   Cardiovascular: Negative for cp or palpitations    Gastrointestinal: Negative for nausea, diarrhea and constipation.  Genitourinary: Negative for urgency and frequency.  Skin: Negative for pallor or rash   Neurological: Negative for weakness, light-headedness, numbness and headaches.  Hematological: Negative for adenopathy. Does not bruise/bleed easily.  Psychiatric/Behavioral: Negative for dysphoric mood. The patient is not nervous/anxious.         Objective:   Physical Exam  Constitutional: She appears well-developed and well-nourished. No distress.  HENT:  Head: Normocephalic and atraumatic.  Mouth/Throat: Oropharynx is clear and moist.  Eyes: Conjunctivae and EOM are normal. Pupils are equal, round, and reactive to light. No scleral icterus.  Neck: Normal range of motion. Neck supple. No JVD present. Carotid bruit is not present. No thyromegaly present.   Cardiovascular: Normal rate, regular rhythm, normal heart sounds and intact distal pulses.  Exam reveals no gallop.   Pulmonary/Chest: Effort normal and breath sounds normal. No respiratory distress. She has no wheezes.  Abdominal: She exhibits no abdominal bruit.  Musculoskeletal: Normal range of motion. She exhibits no edema and no tenderness.  Lymphadenopathy:    She has no cervical adenopathy.  Neurological: She is alert. She has normal reflexes. No cranial nerve deficit. She exhibits normal muscle tone. Coordination normal.  Skin: Skin is warm and dry. No rash noted. No pallor.  Psychiatric: She has a normal mood and affect.          Assessment & Plan:

## 2013-03-25 NOTE — Assessment & Plan Note (Signed)
Rev last labs Cr of 1.3 down from 1.4  Disc avoidance of renal toxic meds and imp of water intake and good bp control

## 2013-05-02 ENCOUNTER — Other Ambulatory Visit: Payer: Self-pay

## 2013-05-02 DIAGNOSIS — Z1231 Encounter for screening mammogram for malignant neoplasm of breast: Secondary | ICD-10-CM

## 2013-05-30 ENCOUNTER — Ambulatory Visit
Admission: RE | Admit: 2013-05-30 | Discharge: 2013-05-30 | Disposition: A | Discharge status: Home / Self Care | Payer: Medicare Other | Source: Ambulatory Visit

## 2013-05-30 DIAGNOSIS — Z1231 Encounter for screening mammogram for malignant neoplasm of breast: Secondary | ICD-10-CM

## 2013-07-06 ENCOUNTER — Other Ambulatory Visit: Payer: Self-pay | Admitting: Family Medicine

## 2013-09-17 ENCOUNTER — Telehealth: Payer: Self-pay | Admitting: Family Medicine

## 2013-09-17 ENCOUNTER — Telehealth (INDEPENDENT_AMBULATORY_CARE_PROVIDER_SITE_OTHER): Payer: Medicare Other | Admitting: Family Medicine

## 2013-09-17 ENCOUNTER — Other Ambulatory Visit (INDEPENDENT_AMBULATORY_CARE_PROVIDER_SITE_OTHER): Payer: Medicare Other

## 2013-09-17 DIAGNOSIS — E785 Hyperlipidemia, unspecified: Secondary | ICD-10-CM | POA: Diagnosis not present

## 2013-09-17 DIAGNOSIS — I10 Essential (primary) hypertension: Secondary | ICD-10-CM | POA: Diagnosis not present

## 2013-09-17 LAB — CBC WITH DIFFERENTIAL/PLATELET
BASOS ABS: 0.1 10*3/uL (ref 0.0–0.1)
BASOS PCT: 0.9 % (ref 0.0–3.0)
EOS ABS: 0.6 10*3/uL (ref 0.0–0.7)
Eosinophils Relative: 10.1 % — ABNORMAL HIGH (ref 0.0–5.0)
HCT: 41.4 % (ref 36.0–46.0)
HEMOGLOBIN: 13.5 g/dL (ref 12.0–15.0)
Lymphocytes Relative: 56.4 % — ABNORMAL HIGH (ref 12.0–46.0)
Lymphs Abs: 3.5 10*3/uL (ref 0.7–4.0)
MCHC: 32.7 g/dL (ref 30.0–36.0)
MCV: 88.2 fl (ref 78.0–100.0)
Monocytes Absolute: 0.4 10*3/uL (ref 0.1–1.0)
Monocytes Relative: 6 % (ref 3.0–12.0)
NEUTROS ABS: 1.6 10*3/uL (ref 1.4–7.7)
Neutrophils Relative %: 26.6 % — ABNORMAL LOW (ref 43.0–77.0)
Platelets: 213 10*3/uL (ref 150.0–400.0)
RBC: 4.7 Mil/uL (ref 3.87–5.11)
RDW: 13.9 % (ref 11.5–15.5)
WBC: 6.1 10*3/uL (ref 4.0–10.5)

## 2013-09-17 LAB — LIPID PANEL
Cholesterol: 219 mg/dL — ABNORMAL HIGH (ref 0–200)
HDL: 35.1 mg/dL — ABNORMAL LOW (ref >39.00)
LDL CALC: 153 mg/dL — AB (ref 0–99)
TRIGLYCERIDES: 157 mg/dL — AB (ref 0.0–149.0)
Total CHOL/HDL Ratio: 6
VLDL: 31.4 mg/dL (ref 0.0–40.0)

## 2013-09-17 LAB — COMPREHENSIVE METABOLIC PANEL
ALK PHOS: 75 U/L (ref 39–117)
ALT: 11 U/L (ref 0–35)
AST: 17 U/L (ref 0–37)
Albumin: 3.7 g/dL (ref 3.5–5.2)
BUN: 18 mg/dL (ref 6–23)
CO2: 26 mEq/L (ref 19–32)
CREATININE: 1.3 mg/dL — AB (ref 0.4–1.2)
Calcium: 9.1 mg/dL (ref 8.4–10.5)
Chloride: 108 mEq/L (ref 96–112)
GFR: 49.4 mL/min — ABNORMAL LOW (ref >60.00)
Glucose, Bld: 91 mg/dL (ref 70–99)
Potassium: 4.2 mEq/L (ref 3.5–5.1)
Sodium: 142 mEq/L (ref 135–145)
Total Bilirubin: 0.7 mg/dL (ref 0.2–1.2)
Total Protein: 6.9 g/dL (ref 6.0–8.3)

## 2013-09-17 LAB — TSH: TSH: 1.21 u[IU]/mL (ref 0.35–4.50)

## 2013-09-17 NOTE — Telephone Encounter (Signed)
Message copied by [name] on Tue Sep 17, 2013 11:16 AM ------      Message from: Royann Shivers A      Created: Tue Sep 17, 2013  9:54 AM       Please place orders for her labs. Upcoming CPE next week. I drew enough for lipid, cmp, cbc,vit d, b12.  Thanks! ------

## 2013-09-17 NOTE — Telephone Encounter (Signed)
Message copied by [name] on Tue Sep 17, 2013 11:10 AM ------      Message from: Marchia Bond      Created: Tue Sep 17, 2013 10:34 AM      Regarding: had labs today, need orders.       Pt came for cpx labs this am, need orders.            Thanks      Tasha ------

## 2013-09-18 ENCOUNTER — Telehealth: Payer: Self-pay | Admitting: Family Medicine

## 2013-09-18 NOTE — Telephone Encounter (Signed)
Relevant patient education assigned to patient using Emmi. ° °

## 2013-09-23 ENCOUNTER — Ambulatory Visit (INDEPENDENT_AMBULATORY_CARE_PROVIDER_SITE_OTHER): Payer: Medicare Other | Admitting: Family Medicine

## 2013-09-23 ENCOUNTER — Other Ambulatory Visit: Payer: Self-pay | Admitting: Family Medicine

## 2013-09-23 ENCOUNTER — Encounter: Payer: Self-pay | Admitting: Family Medicine

## 2013-09-23 VITALS — BP 130/85 | HR 82 | Temp 98.3°F | Ht 62.0 in | Wt 159.5 lb

## 2013-09-23 DIAGNOSIS — N259 Disorder resulting from impaired renal tubular function, unspecified: Secondary | ICD-10-CM | POA: Diagnosis not present

## 2013-09-23 DIAGNOSIS — I10 Essential (primary) hypertension: Secondary | ICD-10-CM

## 2013-09-23 DIAGNOSIS — E785 Hyperlipidemia, unspecified: Secondary | ICD-10-CM

## 2013-09-23 DIAGNOSIS — Z0000 Encounter for general adult medical examination without abnormal findings: Secondary | ICD-10-CM | POA: Diagnosis not present

## 2013-09-23 DIAGNOSIS — B9789 Other viral agents as the cause of diseases classified elsewhere: Secondary | ICD-10-CM

## 2013-09-23 DIAGNOSIS — J069 Acute upper respiratory infection, unspecified: Secondary | ICD-10-CM

## 2013-09-23 DIAGNOSIS — M48061 Spinal stenosis, lumbar region without neurogenic claudication: Secondary | ICD-10-CM | POA: Insufficient documentation

## 2013-09-23 MED ORDER — METOPROLOL SUCCINATE ER 50 MG PO TB24: ORAL_TABLET | ORAL | Status: DC

## 2013-09-23 NOTE — Progress Notes (Signed)
Subjective:    Patient ID: Martha Ortega, female    DOB: Sep 30, 1934, 79 y.o.   MRN: [id number]  HPI I have personally reviewed the Medicare Annual Wellness questionnaire and have noted 1. The patient's medical and social history 2. Their use of alcohol, tobacco or illicit drugs 3. Their current medications and supplements 4. The patient's functional ability including ADL's, fall risks, home safety risks and hearing or visual             impairment. 5. Diet and physical activities 6. Evidence for depression or mood disorders  The patients weight, height, BMI have been recorded in the chart and visual acuity is per eye clinic.  I have made referrals, counseling and provided education to the patient based review of the above and I have provided the pt with a written personalized care plan for preventive services.  Also woke up sat am  ST and a cough  No nasal symptoms  Hurts her throat to cough -not severe  Cough is dry  No fever   See scanned forms.  Routine anticipatory guidance given to patient.  See health maintenance. Colon cancer screening colonosc 11/07 - 10 year f/u per pt no polyps  Breast cancer screening mammogram 2/15 nl Self breast exam- no lumps  Flu vaccine 9/14  Tetanus vaccine 7/10 Pneumovax 1/05 - she is interested in prevnar Zoster vaccine 1/02  Advance directive -does not have a living will - plans to work on that  Cognitive function addressed- see scanned forms- and if abnormal then additional documentation follows. - she has no problems at all   PMH and SH reviewed  Meds, vitals, and allergies reviewed.   ROS: See HPI.  Otherwise negative.     bp is up on first check  today - better on 2nd check  No cp or palpitations or headaches or edema  No side effects to medicines  BP Readings from Last 3 Encounters:  09/23/13 156/92  03/25/13 136/82  02/11/13 170/86     Hyperlipidemia Lab Results  Component Value Date   CHOL 219* 09/17/2013   CHOL  238* 01/14/2013   CHOL 217* 12/12/2011   Lab Results  Component Value Date   HDL 35.10* 09/17/2013   HDL 35.70* 01/14/2013   HDL 41.20 12/12/2011   Lab Results  Component Value Date   LDLCALC 153* 09/17/2013   LDLCALC 92 11/25/2010   LDLCALC 88 05/17/2010   Lab Results  Component Value Date   TRIG 157.0* 09/17/2013   TRIG 194.0* 01/14/2013   TRIG 119.0 12/12/2011   Lab Results  Component Value Date   CHOLHDL 6 09/17/2013   CHOLHDL 7 01/14/2013   CHOLHDL 5 12/12/2011   Lab Results  Component Value Date   LDLDIRECT 172.8 01/14/2013   LDLDIRECT 158.1 12/12/2011   LDLDIRECT 179.5 06/10/2011   no meds  Improved - LDL is down /eating better , lost wt also  Intol statin meds   Patient Active Problem List   Diagnosis Date Noted  . Encounter for Medicare annual wellness exam 09/23/2013  . Spinal stenosis of lumbar region 09/23/2013  . Viral URI with cough 09/23/2013  . Pain in left hip 07/23/2012  . GERD (gastroesophageal reflux disease) 12/12/2011  . RENAL INSUFFICIENCY 11/26/2008  . ALLERGIC RHINITIS 09/10/2007  . HYPERLIPIDEMIA 09/08/2006  . HYPERTENSION 09/08/2006  . POSTNASAL DRIP SYNDROME 09/08/2006  . GERD 09/08/2006  . DIVERTICULITIS, HX OF 09/08/2006   Past Medical History  Diagnosis Date  . History  of diverticulitis of colon   . Hyperlipidemia   . Hypertension   . Allergic rhinitis   . Hyperglycemia 09    mild  . Abnormal blood findings     baseline cr 1.3, watching for renal insufficiency   Past Surgical History  Procedure Laterality Date  . Appendectomy    . Abdominal hysterectomy    . Cystectomy  93    R axilla  . Cystectomy  97    R breast  . Cardiovascular stress test  07/21/04  . Barium swallow  10/18/03   History  Substance Use Topics  . Smoking status: Former Smoker    Quit date: 04/25/1978  . Smokeless tobacco: Not on file  . Alcohol Use: No   Family History  Problem Relation Age of Onset  . Arthritis Mother   . Arthritis Sister   . Lupus  Sister     ?  . Diabetes Sister   . Prostate cancer Brother   . Stroke Father   . Hypertension Father     ?   Allergies  Allergen Reactions  . Amlodipine Swelling    Throat and mouth swelling  . Fluticasone Propionate     REACTION: does not help  . Loratadine     REACTION: does not work  . Lovastatin     REACTION: was not effective  . Omeprazole Swelling    Throat, lips, and mouth swelling  . Rosuvastatin     REACTION: GI side eff  . Zocor [Simvastatin - High Dose]     Mouth and throat swelled   No current outpatient prescriptions on file prior to visit.   No current facility-administered medications on file prior to visit.    Review of Systems Review of Systems  Constitutional: Negative for fever, appetite change, fatigue and unexpected weight change.  ENt pos for rhinorrhea and drip and congestion and ST, neg for sinus pain  Eyes: Negative for pain and visual disturbance.  Respiratory: Negative for cough and shortness of breath.   Cardiovascular: Negative for cp or palpitations    Gastrointestinal: Negative for nausea, diarrhea and constipation.  Genitourinary: Negative for urgency and frequency.  Skin: Negative for pallor or rash   MSK pos for chronic back pain that radiates into leg and hip  Neurological: Negative for weakness, light-headedness, numbness and headaches.  Hematological: Negative for adenopathy. Does not bruise/bleed easily.  Psychiatric/Behavioral: Negative for dysphoric mood. The patient is not nervous/anxious.         Objective:   Physical Exam  Constitutional: She appears well-developed and well-nourished. No distress.  HENT:  Head: Normocephalic and atraumatic.  Right Ear: External ear normal.  Left Ear: External ear normal.  Nose: Nose normal.  Mouth/Throat: Oropharynx is clear and moist.  Eyes: Conjunctivae and EOM are normal. Pupils are equal, round, and reactive to light. Right eye exhibits no discharge. Left eye exhibits no discharge.  No scleral icterus.  Neck: Normal range of motion. Neck supple. No JVD present. No thyromegaly present.  Cardiovascular: Normal rate, regular rhythm, normal heart sounds and intact distal pulses.  Exam reveals no gallop.   Pulmonary/Chest: Effort normal and breath sounds normal. No respiratory distress. She has no wheezes. She has no rales.  Abdominal: Soft. Bowel sounds are normal. She exhibits no distension and no mass. There is no tenderness.  Genitourinary: No breast swelling, tenderness, discharge or bleeding.  Breast exam: No mass, nodules, thickening, tenderness, bulging, retraction, inflamation, nipple discharge or skin changes noted.  No axillary  or clavicular LA.      Musculoskeletal: She exhibits no edema and no tenderness.  Lymphadenopathy:    She has no cervical adenopathy.  Neurological: She is alert. She has normal reflexes. No cranial nerve deficit. She exhibits normal muscle tone. Coordination normal.  Skin: Skin is warm and dry. No rash noted. No erythema. No pallor.  Psychiatric: She has a normal mood and affect.          Assessment & Plan:

## 2013-09-23 NOTE — Assessment & Plan Note (Signed)
Disc goals for lipids and reasons to control them Rev labs with pt Rev low sat fat diet in detail  Given handout  Intol of statin med

## 2013-09-23 NOTE — Assessment & Plan Note (Addendum)
bp in fair control at this time  BP Readings from Last 1 Encounters:  09/23/13 130/85   No changes needed Disc lifstyle change with low sodium diet and exercise   Labs reviewed

## 2013-09-23 NOTE — Progress Notes (Signed)
Pre visit review using our clinic review tool, if applicable. No additional management support is needed unless otherwise documented below in the visit note. 

## 2013-09-23 NOTE — Assessment & Plan Note (Signed)
Reviewed health habits including diet and exercise and skin cancer prevention Reviewed appropriate screening tests for age  Also reviewed health mt list, fam hx and immunization status , as well as social and family history   See HPI Labs reviewed Enc to work on a living will

## 2013-09-23 NOTE — Assessment & Plan Note (Signed)
Chemistry       Component Value Date/Time   CALCIUM 9.1 09/17/2013 1132   ALKPHOS 75 09/17/2013 1132   AST 17 09/17/2013 1132   ALT 11 09/17/2013 1132   BILITOT 0.7 09/17/2013 1132    This is stable  Rev imp of avoidance of nsaid /good bp control and good water intake

## 2013-09-23 NOTE — Assessment & Plan Note (Signed)
Reassuring exam  Disc symptomatic care - see instructions on AVS  Update if not starting to improve in a week or if worsening

## 2013-09-23 NOTE — Assessment & Plan Note (Signed)
Pt brought records today from ortho in [place] She may req ortho consult closer by Not ready for injection  She has done PT Records reviewed

## 2013-09-23 NOTE — Patient Instructions (Addendum)
I think you have a cold - and it will take a while to get better  Watch for fever or severe cough and keep me posted  Tylenol is ok for sore throat and also for back pain  We will do the prevnar vaccine next time when you feel better Don't forget to work on a living will  - I gave you a packet to read on the topic  Watch diet for cholesterol   Avoid red meat/ fried foods/ egg yolks/ fatty breakfast meats/ butter, cheese and high fat dairy/ and shellfish  Fat and Cholesterol Control Diet Fat and cholesterol levels in your blood and organs are influenced by your diet. High levels of fat and cholesterol may lead to diseases of the heart, small and large blood vessels, gallbladder, liver, and pancreas. CONTROLLING FAT AND CHOLESTEROL WITH DIET Although exercise and lifestyle factors are important, your diet is key. That is because certain foods are known to raise cholesterol and others to lower it. The goal is to balance foods for their effect on cholesterol and more importantly, to replace saturated and trans fat with other types of fat, such as monounsaturated fat, polyunsaturated fat, and omega-3 fatty acids. On average, a person should consume no more than 15 to 17 g of saturated fat daily. Saturated and trans fats are considered "bad" fats, and they will raise LDL cholesterol. Saturated fats are primarily found in animal products such as meats, butter, and cream. However, that does not mean you need to give up all your favorite foods. Today, there are good tasting, low-fat, low-cholesterol substitutes for most of the things you like to eat. Choose low-fat or nonfat alternatives. Choose round or loin cuts of red meat. These types of cuts are lowest in fat and cholesterol. Chicken (without the skin), fish, veal, and ground [place] breast are great choices. Eliminate fatty meats, such as hot dogs and salami. Even shellfish have little or no saturated fat. Have a 3 oz (85 g) portion when you eat lean meat,  poultry, or fish. Trans fats are also called "partially hydrogenated oils." They are oils that have been scientifically manipulated so that they are solid at room temperature resulting in a longer shelf life and improved taste and texture of foods in which they are added. Trans fats are found in stick margarine, some tub margarines, cookies, crackers, and baked goods.  When baking and cooking, oils are a great substitute for butter. The monounsaturated oils are especially beneficial since it is believed they lower LDL and raise HDL. The oils you should avoid entirely are saturated tropical oils, such as coconut and palm.  Remember to eat a lot from food groups that are naturally free of saturated and trans fat, including fish, fruit, vegetables, beans, grains (barley, rice, couscous, bulgur wheat), and pasta (without cream sauces).  IDENTIFYING FOODS THAT LOWER FAT AND CHOLESTEROL  Soluble fiber may lower your cholesterol. This type of fiber is found in fruits such as apples, vegetables such as broccoli, potatoes, and carrots, legumes such as beans, peas, and lentils, and grains such as barley. Foods fortified with plant sterols (phytosterol) may also lower cholesterol. You should eat at least 2 g per day of these foods for a cholesterol lowering effect.  Read package labels to identify low-saturated fats, trans fat free, and low-fat foods at the supermarket. Select cheeses that have only 2 to 3 g saturated fat per ounce. Use a heart-healthy tub margarine that is free of trans fats or partially  hydrogenated oil. When buying baked goods (cookies, crackers), avoid partially hydrogenated oils. Breads and muffins should be made from whole grains (whole-wheat or whole oat flour, instead of "flour" or "enriched flour"). Buy non-creamy canned soups with reduced salt and no added fats.  FOOD PREPARATION TECHNIQUES  Never deep-fry. If you must fry, either stir-fry, which uses very little fat, or use non-stick cooking  sprays. When possible, broil, bake, or roast meats, and steam vegetables. Instead of putting butter or margarine on vegetables, use lemon and herbs, applesauce, and cinnamon (for squash and sweet potatoes). Use nonfat yogurt, salsa, and low-fat dressings for salads.  LOW-SATURATED FAT / LOW-FAT FOOD SUBSTITUTES Meats / Saturated Fat (g)  Avoid: Steak, marbled (3 oz/85 g) / 11 g  Choose: Steak, lean (3 oz/85 g) / 4 g  Avoid: Hamburger (3 oz/85 g) / 7 g  Choose: Hamburger, lean (3 oz/85 g) / 5 g  Avoid: Ham (3 oz/85 g) / 6 g  Choose: Ham, lean cut (3 oz/85 g) / 2.4 g  Avoid: Chicken, with skin, dark meat (3 oz/85 g) / 4 g  Choose: Chicken, skin removed, dark meat (3 oz/85 g) / 2 g  Avoid: Chicken, with skin, light meat (3 oz/85 g) / 2.5 g  Choose: Chicken, skin removed, light meat (3 oz/85 g) / 1 g Dairy / Saturated Fat (g)  Avoid: Whole milk (1 cup) / 5 g  Choose: Low-fat milk, 2% (1 cup) / 3 g  Choose: Low-fat milk, 1% (1 cup) / 1.5 g  Choose: Skim milk (1 cup) / 0.3 g  Avoid: Hard cheese (1 oz/28 g) / 6 g  Choose: Skim milk cheese (1 oz/28 g) / 2 to 3 g  Avoid: Cottage cheese, 4% fat (1 cup) / 6.5 g  Choose: Low-fat cottage cheese, 1% fat (1 cup) / 1.5 g  Avoid: Ice cream (1 cup) / 9 g  Choose: Sherbet (1 cup) / 2.5 g  Choose: Nonfat frozen yogurt (1 cup) / 0.3 g  Choose: Frozen fruit bar / trace  Avoid: Whipped cream (1 tbs) / 3.5 g  Choose: Nondairy whipped topping (1 tbs) / 1 g Condiments / Saturated Fat (g)  Avoid: Mayonnaise (1 tbs) / 2 g  Choose: Low-fat mayonnaise (1 tbs) / 1 g  Avoid: Butter (1 tbs) / 7 g  Choose: Extra light margarine (1 tbs) / 1 g  Avoid: Coconut oil (1 tbs) / 11.8 g  Choose: Olive oil (1 tbs) / 1.8 g  Choose: Corn oil (1 tbs) / 1.7 g  Choose: Safflower oil (1 tbs) / 1.2 g  Choose: Sunflower oil (1 tbs) / 1.4 g  Choose: Soybean oil (1 tbs) / 2.4 g  Choose: Canola oil (1 tbs) / 1 g Document Released: 04/11/2005  Document Revised: 08/06/2012 Document Reviewed: 09/30/2010 ExitCare Patient Information 2014 [place], [place].

## 2013-09-26 ENCOUNTER — Encounter: Payer: Self-pay | Admitting: Family Medicine

## 2013-09-27 MED ORDER — AMOXICILLIN 500 MG PO CAPS: 500.0000 mg | ORAL_CAPSULE | Freq: Three times a day (TID) | ORAL | Status: DC

## 2013-10-04 ENCOUNTER — Encounter: Payer: Self-pay | Admitting: Family Medicine

## 2014-02-03 DIAGNOSIS — Z23 Encounter for immunization: Secondary | ICD-10-CM | POA: Diagnosis not present

## 2014-04-29 ENCOUNTER — Other Ambulatory Visit: Payer: Self-pay

## 2014-04-29 DIAGNOSIS — Z1231 Encounter for screening mammogram for malignant neoplasm of breast: Secondary | ICD-10-CM

## 2014-06-02 ENCOUNTER — Ambulatory Visit
Admission: RE | Admit: 2014-06-02 | Discharge: 2014-06-02 | Disposition: A | Discharge status: Home / Self Care | Payer: Medicare Other | Source: Ambulatory Visit

## 2014-06-02 DIAGNOSIS — Z1231 Encounter for screening mammogram for malignant neoplasm of breast: Secondary | ICD-10-CM | POA: Diagnosis not present

## 2014-08-10 ENCOUNTER — Other Ambulatory Visit: Payer: Self-pay | Admitting: Family Medicine

## 2014-10-22 ENCOUNTER — Encounter: Payer: Self-pay | Admitting: Family Medicine

## 2014-10-24 ENCOUNTER — Telehealth: Payer: Self-pay | Admitting: Family Medicine

## 2014-10-24 MED ORDER — TRAMADOL HCL 50 MG PO TABS: 50.0000 mg | ORAL_TABLET | Freq: Three times a day (TID) | ORAL | Status: DC | PRN

## 2014-10-24 MED ORDER — PROMETHAZINE HCL 25 MG PO TABS: 25.0000 mg | ORAL_TABLET | Freq: Three times a day (TID) | ORAL | Status: DC | PRN

## 2014-10-24 NOTE — Telephone Encounter (Signed)
Patient said she went to the pharmacy and there was a rx for nausea, but she thought a rx for Tramadol was going to be called in to the pharmacy,also.  She said if she wasn't suppose to get a rx for Tramadol you don't need to call her back.

## 2014-10-24 NOTE — Telephone Encounter (Signed)
Spoke with pharmacy and they just had not checked their voicemail yet. They will check it and fill the tramadol for the pt too. Pt advise and will check with pharmacy later

## 2014-10-24 NOTE — Telephone Encounter (Signed)
Please call this in for her pain issues/sciatica - tramadol (and phenergan in case it causes nausea) Ask pt which pharmacy

## 2014-10-24 NOTE — Telephone Encounter (Signed)
Rx's sent to pt's pharmacy

## 2014-11-07 ENCOUNTER — Other Ambulatory Visit: Payer: Self-pay | Admitting: Family Medicine

## 2015-01-05 DIAGNOSIS — H2513 Age-related nuclear cataract, bilateral: Secondary | ICD-10-CM | POA: Diagnosis not present

## 2015-01-21 ENCOUNTER — Encounter: Payer: Self-pay | Admitting: Family Medicine

## 2015-01-22 ENCOUNTER — Telehealth: Payer: Self-pay | Admitting: Family Medicine

## 2015-01-22 DIAGNOSIS — M48061 Spinal stenosis, lumbar region without neurogenic claudication: Secondary | ICD-10-CM

## 2015-01-22 NOTE — Telephone Encounter (Signed)
Ortho ref Letta Kocher

## 2015-02-04 ENCOUNTER — Other Ambulatory Visit: Payer: Self-pay | Admitting: Family Medicine

## 2015-02-04 NOTE — Telephone Encounter (Signed)
Please schedule a fall or winter f/u and refill until then

## 2015-02-04 NOTE — Telephone Encounter (Signed)
Electronic refill request, pt hasn't been seen in over a year and no future appt., please advise

## 2015-02-05 NOTE — Telephone Encounter (Signed)
appt scheduled and med refilled 

## 2015-02-06 DIAGNOSIS — M5416 Radiculopathy, lumbar region: Secondary | ICD-10-CM | POA: Diagnosis not present

## 2015-02-13 DIAGNOSIS — R200 Anesthesia of skin: Secondary | ICD-10-CM | POA: Diagnosis not present

## 2015-02-13 DIAGNOSIS — I63211 Cerebral infarction due to unspecified occlusion or stenosis of right vertebral arteries: Secondary | ICD-10-CM | POA: Diagnosis not present

## 2015-02-13 DIAGNOSIS — Z9181 History of falling: Secondary | ICD-10-CM | POA: Diagnosis not present

## 2015-02-13 DIAGNOSIS — I10 Essential (primary) hypertension: Secondary | ICD-10-CM | POA: Diagnosis not present

## 2015-02-13 DIAGNOSIS — M4806 Spinal stenosis, lumbar region: Secondary | ICD-10-CM | POA: Diagnosis not present

## 2015-02-13 DIAGNOSIS — M6281 Muscle weakness (generalized): Secondary | ICD-10-CM | POA: Diagnosis not present

## 2015-02-13 DIAGNOSIS — I6322 Cerebral infarction due to unspecified occlusion or stenosis of basilar arteries: Secondary | ICD-10-CM | POA: Diagnosis not present

## 2015-02-13 DIAGNOSIS — M5430 Sciatica, unspecified side: Secondary | ICD-10-CM | POA: Diagnosis not present

## 2015-02-13 DIAGNOSIS — I639 Cerebral infarction, unspecified: Secondary | ICD-10-CM | POA: Diagnosis not present

## 2015-02-14 DIAGNOSIS — I6523 Occlusion and stenosis of bilateral carotid arteries: Secondary | ICD-10-CM | POA: Diagnosis not present

## 2015-02-18 ENCOUNTER — Telehealth: Payer: Self-pay | Admitting: Family Medicine

## 2015-02-18 DIAGNOSIS — M5432 Sciatica, left side: Secondary | ICD-10-CM | POA: Diagnosis not present

## 2015-02-18 DIAGNOSIS — I69354 Hemiplegia and hemiparesis following cerebral infarction affecting left non-dominant side: Secondary | ICD-10-CM | POA: Diagnosis not present

## 2015-02-18 NOTE — Telephone Encounter (Signed)
[name] notified Dr. Glori Bickers okay with signing off on PT OT and Aid, [name] will fax over orders for Dr. Glori Bickers to sign

## 2015-02-18 NOTE — Telephone Encounter (Signed)
Yes that is fine

## 2015-02-18 NOTE — Telephone Encounter (Signed)
[name] from [facility] called-  Pt had a stroke last week, has home health PT OT and aide orders, are you okay to sign? Pulse 95 bp 165/90 o2 sat 98% Temp 99.5 cb number [phone]

## 2015-02-20 DIAGNOSIS — I69354 Hemiplegia and hemiparesis following cerebral infarction affecting left non-dominant side: Secondary | ICD-10-CM | POA: Diagnosis not present

## 2015-02-20 DIAGNOSIS — M5432 Sciatica, left side: Secondary | ICD-10-CM | POA: Diagnosis not present

## 2015-02-23 DIAGNOSIS — M5432 Sciatica, left side: Secondary | ICD-10-CM | POA: Diagnosis not present

## 2015-02-23 DIAGNOSIS — I69354 Hemiplegia and hemiparesis following cerebral infarction affecting left non-dominant side: Secondary | ICD-10-CM | POA: Diagnosis not present

## 2015-02-24 ENCOUNTER — Ambulatory Visit: Payer: Medicare Other | Admitting: Family Medicine

## 2015-02-26 DIAGNOSIS — I69354 Hemiplegia and hemiparesis following cerebral infarction affecting left non-dominant side: Secondary | ICD-10-CM | POA: Diagnosis not present

## 2015-02-26 DIAGNOSIS — M5432 Sciatica, left side: Secondary | ICD-10-CM | POA: Diagnosis not present

## 2015-03-02 ENCOUNTER — Encounter: Payer: Self-pay | Admitting: Family Medicine

## 2015-03-02 ENCOUNTER — Ambulatory Visit (INDEPENDENT_AMBULATORY_CARE_PROVIDER_SITE_OTHER): Payer: Medicare Other | Admitting: Family Medicine

## 2015-03-02 VITALS — BP 135/85 | HR 65 | Temp 97.8°F | Ht 62.0 in | Wt 152.2 lb

## 2015-03-02 DIAGNOSIS — I63531 Cerebral infarction due to unspecified occlusion or stenosis of right posterior cerebral artery: Secondary | ICD-10-CM

## 2015-03-02 DIAGNOSIS — E785 Hyperlipidemia, unspecified: Secondary | ICD-10-CM

## 2015-03-02 DIAGNOSIS — Z8673 Personal history of transient ischemic attack (TIA), and cerebral infarction without residual deficits: Secondary | ICD-10-CM | POA: Insufficient documentation

## 2015-03-02 DIAGNOSIS — I10 Essential (primary) hypertension: Secondary | ICD-10-CM | POA: Diagnosis not present

## 2015-03-02 DIAGNOSIS — I639 Cerebral infarction, unspecified: Secondary | ICD-10-CM | POA: Insufficient documentation

## 2015-03-02 LAB — COMPREHENSIVE METABOLIC PANEL
ALT: 13 U/L (ref 0–35)
AST: 17 U/L (ref 0–37)
Albumin: 3.9 g/dL (ref 3.5–5.2)
Alkaline Phosphatase: 95 U/L (ref 39–117)
BILIRUBIN TOTAL: 0.5 mg/dL (ref 0.2–1.2)
BUN: 16 mg/dL (ref 6–23)
CO2: 26 meq/L (ref 19–32)
CREATININE: 1.51 mg/dL — AB (ref 0.40–1.20)
Calcium: 9.7 mg/dL (ref 8.4–10.5)
Chloride: 106 mEq/L (ref 96–112)
GFR: 42.87 mL/min — ABNORMAL LOW (ref >60.00)
Glucose, Bld: 92 mg/dL (ref 70–99)
Potassium: 4.5 mEq/L (ref 3.5–5.1)
SODIUM: 140 meq/L (ref 135–145)
Total Protein: 7.2 g/dL (ref 6.0–8.3)

## 2015-03-02 LAB — LIPID PANEL
Cholesterol: 156 mg/dL (ref 0–200)
HDL: 34.6 mg/dL — ABNORMAL LOW (ref >39.00)
LDL Cholesterol: 97 mg/dL (ref 0–99)
NONHDL: 121.26
TRIGLYCERIDES: 119 mg/dL (ref 0.0–149.0)
Total CHOL/HDL Ratio: 5
VLDL: 23.8 mg/dL (ref 0.0–40.0)

## 2015-03-02 NOTE — Assessment & Plan Note (Signed)
Reviewed notes /records from her stay at [facility]  Ref to neurologist as recommended for a f/u  No embolic source found - ? If small vessel dz On asa and low dose crestor now (lab today)  Disc diet and activity level  Doing very well with PT/OT- she has improving function of L leg and improved gait with less dizziness  Urged to continue cane or walker  Disc s/s of CVA -to call 911 if needed

## 2015-03-02 NOTE — Progress Notes (Signed)
Subjective:    Patient ID: Martha Ortega, female    DOB: 05/31/1934, 80 y.o.   MRN: [id number]  HPI Here for f/u of CVA  She went to [facility] 02/13/15 Had c/o of L leg weakness and stumbling around  Found MRI R Thalamic CVA (lacunar) - posterior artery  Carotid US - minimal stenosis   Cholesterol high LDL 161 Blood pressure was borderling  A1C 5.3  BP Readings from Last 3 Encounters:  03/02/15 140/80  09/23/13 130/85  03/25/13 136/82    Sent home on asa 81 mg  Also small dose of crestor 5 mg - tolerating that ok   PT and OT are still coming out  That is helpful   Has a lot of cramping in her L foot at night Strength is gradually getting better  Feels tight in general on L side of body   Still feels like she has rocks under her L foot- and it feels cold at times  No falls Uses a cane and feels steady for the most part with that  Sister and son are helping quite a bit - especially taking the trash out and cleaning   She is eating a low fat diet   Is supposed to follow up with Dr Neomia Dear in neuro at [facility] to get that appt - and has the phone number    Patient Active Problem List   Diagnosis Date Noted  . CVA (cerebral vascular accident) ([place]) 03/02/2015  . Encounter for Medicare annual wellness exam 09/23/2013  . Spinal stenosis of lumbar region 09/23/2013  . Viral URI with cough 09/23/2013  . Pain in left hip 07/23/2012  . GERD (gastroesophageal reflux disease) 12/12/2011  . RENAL INSUFFICIENCY 11/26/2008  . ALLERGIC RHINITIS 09/10/2007  . Hyperlipidemia 09/08/2006  . Essential hypertension 09/08/2006  . POSTNASAL DRIP SYNDROME 09/08/2006  . GERD 09/08/2006  . DIVERTICULITIS, HX OF 09/08/2006   Past Medical History  Diagnosis Date  . History of diverticulitis of colon   . Hyperlipidemia   . Hypertension   . Allergic rhinitis   . Hyperglycemia 09    mild  . Abnormal blood findings     baseline cr 1.3, watching for renal insufficiency   Past  Surgical History  Procedure Laterality Date  . Appendectomy    . Abdominal hysterectomy    . Cystectomy  89    R axilla  . Cystectomy  97    R breast  . Cardiovascular stress test  07/21/04  . Barium swallow  10/18/03   Social History  Substance Use Topics  . Smoking status: Former Smoker    Quit date: 04/25/1978  . Smokeless tobacco: None  . Alcohol Use: No   Family History  Problem Relation Age of Onset  . Arthritis Mother   . Arthritis Sister   . Lupus Sister     ?  . Diabetes Sister   . Prostate cancer Brother   . Stroke Father   . Hypertension Father     ?   Allergies  Allergen Reactions  . Amlodipine Swelling    Throat and mouth swelling  . Fluticasone Propionate     REACTION: does not help  . Loratadine     REACTION: does not work  . Lovastatin     REACTION: was not effective  . Omeprazole Swelling    Throat, lips, and mouth swelling  . Rosuvastatin     REACTION: GI side eff  . Zocor [Simvastatin - High  Dose]     Mouth and throat swelled   Current Outpatient Prescriptions on File Prior to Visit  Medication Sig Dispense Refill  . metoprolol succinate (TOPROL-XL) 50 MG 24 hr tablet TAKE 1 TABLET DAILY WITH OR IMMEDIATELY FOLLOWING A MEAL 90 tablet 0   No current facility-administered medications on file prior to visit.    Review of Systems    Review of Systems  Constitutional: Negative for fever, appetite change, fatigue and unexpected weight change.  Eyes: Negative for pain and visual disturbance.  Respiratory: Negative for cough and shortness of breath.   Cardiovascular: Negative for cp or palpitations    Gastrointestinal: Negative for nausea, diarrhea and constipation.  Genitourinary: Negative for urgency and frequency.  Skin: Negative for pallor or rash   Neurological: Negative for , light-headedness, numbness and headaches. neg for slurred speech Hematological: Negative for adenopathy. Does not bruise/bleed easily.  Psychiatric/Behavioral:  Negative for dysphoric mood. The patient is not nervous/anxious.      Objective:   Physical Exam  Constitutional: She is oriented to person, place, and time. She appears well-developed and well-nourished. No distress.  Well appearing with nl speech  HENT:  Head: Normocephalic and atraumatic.  Right Ear: External ear normal.  Left Ear: External ear normal.  Nose: Nose normal.  Mouth/Throat: Oropharynx is clear and moist. No oropharyngeal exudate.  No sinus tenderness No temporal tenderness  No TMJ tenderness  Eyes: Conjunctivae and EOM are normal. Pupils are equal, round, and reactive to light. Right eye exhibits no discharge. Left eye exhibits no discharge. No scleral icterus.  No nystagmus  Neck: Normal range of motion and full passive range of motion without pain. Neck supple. No JVD present. Carotid bruit is not present. No tracheal deviation present. No thyromegaly present.  Cardiovascular: Normal rate, regular rhythm and normal heart sounds.   No murmur heard. Pulmonary/Chest: Effort normal and breath sounds normal. No respiratory distress. She has no wheezes. She has no rales.  Abdominal: Soft. Bowel sounds are normal. She exhibits no distension and no mass. There is no tenderness.  Musculoskeletal: She exhibits no edema or tenderness.  Lymphadenopathy:    She has no cervical adenopathy.  Neurological: She is alert and oriented to person, place, and time. She has normal reflexes. She displays no atrophy and no tremor. No cranial nerve deficit. She exhibits normal muscle tone. She displays a negative Romberg sign. Coordination and gait normal.  No focal cerebellar signs   slt dec strength in L foot and ankle (4/5) Also slt dec sens to lt touch on L foot Gait is slow and steady with cane and no foot drop     Skin: Skin is warm and dry. No rash noted. No pallor.  Psychiatric: She has a normal mood and affect. Her behavior is normal. Thought content normal.            Assessment & Plan:   Problem List Items Addressed This Visit      Cardiovascular and Mediastinum   CVA (cerebral vascular accident) [facility]) - Primary    Reviewed notes /records from her stay at [facility]  Ref to neurologist as recommended for a f/u  No embolic source found - ? If small vessel dz On asa and low dose crestor now (lab today)  Disc diet and activity level  Doing very well with PT/OT- she has improving function of L leg and improved gait with less dizziness  Urged to continue cane or walker  Disc s/s of CVA -to  call 911 if needed       Relevant Medications   rosuvastatin (CRESTOR) 5 MG tablet   aspirin 81 MG tablet   Other Relevant Orders   Ambulatory referral to Neurology   Essential hypertension    Disc goals for new CVA patient BP: 135/85 mmHg   Would like under 130/80- urged her to watch this at home as well  Lab today      Relevant Medications   rosuvastatin (CRESTOR) 5 MG tablet   aspirin 81 MG tablet   Other Relevant Orders   Lipid panel (Completed)   Comprehensive metabolic panel (Completed)     Other   Hyperlipidemia    In pt with new CVA Started crestor 5 mg in hospital for LDL of 161 - tolerating fairly so far (problems with statins in the past)  Lab today  Rev low sat fat diet and handout given       Relevant Medications   rosuvastatin (CRESTOR) 5 MG tablet   aspirin 81 MG tablet   Other Relevant Orders   Lipid panel (Completed)   Comprehensive metabolic panel (Completed)

## 2015-03-02 NOTE — Patient Instructions (Signed)
We will refer you to the neurologist for follow up  Lab today for cholesterol Continue aspirin and other medicines  Take care of yourself  Watch your blood pressure  Watch cholesterol in your diet (Avoid red meat/ fried foods/ egg yolks/ fatty breakfast meats/ butter, cheese and high fat dairy/ and shellfish)

## 2015-03-02 NOTE — Assessment & Plan Note (Signed)
Disc goals for new CVA patient BP: 135/85 mmHg   Would like under 130/80- urged her to watch this at home as well  Lab today

## 2015-03-02 NOTE — Assessment & Plan Note (Signed)
In pt with new CVA Started crestor 5 mg in hospital for LDL of 161 - tolerating fairly so far (problems with statins in the past)  Lab today  Rev low sat fat diet and handout given

## 2015-03-02 NOTE — Progress Notes (Signed)
Pre visit review using our clinic review tool, if applicable. No additional management support is needed unless otherwise documented below in the visit note. 

## 2015-03-03 DIAGNOSIS — I69354 Hemiplegia and hemiparesis following cerebral infarction affecting left non-dominant side: Secondary | ICD-10-CM | POA: Diagnosis not present

## 2015-03-03 DIAGNOSIS — M5432 Sciatica, left side: Secondary | ICD-10-CM | POA: Diagnosis not present

## 2015-03-05 ENCOUNTER — Telehealth: Payer: Self-pay

## 2015-03-05 DIAGNOSIS — M5432 Sciatica, left side: Secondary | ICD-10-CM | POA: Diagnosis not present

## 2015-03-05 DIAGNOSIS — I69354 Hemiplegia and hemiparesis following cerebral infarction affecting left non-dominant side: Secondary | ICD-10-CM | POA: Diagnosis not present

## 2015-03-05 MED ORDER — LISINOPRIL 10 MG PO TABS: 10.0000 mg | ORAL_TABLET | Freq: Every day | ORAL | Status: DC

## 2015-03-05 NOTE — Telephone Encounter (Signed)
Martha Ortega PT with [facility] left v/m; pt was seen earlier this AM; vitals PR 69-12; O2 sat 98% room air and BP 165/90. BP trending high last 10 visits when seen in morning by [name] averaging 160-170/90-95. [name] saw pt one afternoon earlier in week and BP 135/80. [name] wanted Dr Glori Bickers to be aware of trend.

## 2015-03-05 NOTE — Telephone Encounter (Signed)
Thanks for letting me know  I do not want to adv her beta blocker due to pulse rate She does not tolerate amlodipine  Also apprehensive for hctz due to elevated cr  Could see if a small dose of ace would help  Please call in lisinopril Please update me with bp next week/ earlier if any side eff or hypotension

## 2015-03-06 DIAGNOSIS — I69354 Hemiplegia and hemiparesis following cerebral infarction affecting left non-dominant side: Secondary | ICD-10-CM | POA: Diagnosis not present

## 2015-03-06 DIAGNOSIS — M5432 Sciatica, left side: Secondary | ICD-10-CM | POA: Diagnosis not present

## 2015-03-06 MED ORDER — LISINOPRIL 10 MG PO TABS: 10.0000 mg | ORAL_TABLET | Freq: Every day | ORAL | Status: DC

## 2015-03-06 NOTE — Telephone Encounter (Signed)
Pt notified of Dr. Marliss Coots comments/ instruction and verbalized understanding. Called [name] PT and advised him of Dr. Marliss Coots comments too and he will update [name] next week on how pt's BP is doing when he does her PT next week

## 2015-03-09 ENCOUNTER — Telehealth: Payer: Self-pay

## 2015-03-09 DIAGNOSIS — I69354 Hemiplegia and hemiparesis following cerebral infarction affecting left non-dominant side: Secondary | ICD-10-CM | POA: Diagnosis not present

## 2015-03-09 DIAGNOSIS — M5432 Sciatica, left side: Secondary | ICD-10-CM | POA: Diagnosis not present

## 2015-03-09 NOTE — Telephone Encounter (Signed)
[name] PT with [facility] left v/m calling back vital sign update; today at 8:05 AM TPR [id number];  BP 148/80; pulse ox 98 % room air. Pt had taken all morning meds at 7 AM.

## 2015-03-09 NOTE — Telephone Encounter (Signed)
BP sounds like it is gradually improving  Continue the lisinopril and other meds Update me again in 1-2 wk re:bp - I expect it will improve more

## 2015-03-09 NOTE — Telephone Encounter (Signed)
Left detailed voicemail on Martha Ortega's voicemail letting him know Dr. Marliss Coots comments. Called pt and advise her of Dr. Marliss Coots comments too. She will make sure [name] updates [name] next week on how pt's BP is doing

## 2015-03-12 DIAGNOSIS — I69354 Hemiplegia and hemiparesis following cerebral infarction affecting left non-dominant side: Secondary | ICD-10-CM | POA: Diagnosis not present

## 2015-03-12 DIAGNOSIS — M5432 Sciatica, left side: Secondary | ICD-10-CM | POA: Diagnosis not present

## 2015-03-13 DIAGNOSIS — Z8673 Personal history of transient ischemic attack (TIA), and cerebral infarction without residual deficits: Secondary | ICD-10-CM | POA: Diagnosis not present

## 2015-03-17 DIAGNOSIS — I69354 Hemiplegia and hemiparesis following cerebral infarction affecting left non-dominant side: Secondary | ICD-10-CM | POA: Diagnosis not present

## 2015-03-17 DIAGNOSIS — M5432 Sciatica, left side: Secondary | ICD-10-CM | POA: Diagnosis not present

## 2015-03-23 DIAGNOSIS — M5432 Sciatica, left side: Secondary | ICD-10-CM | POA: Diagnosis not present

## 2015-03-23 DIAGNOSIS — I69354 Hemiplegia and hemiparesis following cerebral infarction affecting left non-dominant side: Secondary | ICD-10-CM | POA: Diagnosis not present

## 2015-03-31 DIAGNOSIS — M5432 Sciatica, left side: Secondary | ICD-10-CM | POA: Diagnosis not present

## 2015-03-31 DIAGNOSIS — I69354 Hemiplegia and hemiparesis following cerebral infarction affecting left non-dominant side: Secondary | ICD-10-CM | POA: Diagnosis not present

## 2015-04-01 ENCOUNTER — Other Ambulatory Visit: Payer: Self-pay | Admitting: Family Medicine

## 2015-04-01 DIAGNOSIS — N289 Disorder of kidney and ureter, unspecified: Secondary | ICD-10-CM

## 2015-04-07 ENCOUNTER — Other Ambulatory Visit: Payer: Self-pay | Admitting: *Deleted

## 2015-04-07 MED ORDER — LISINOPRIL 10 MG PO TABS: 10.0000 mg | ORAL_TABLET | Freq: Every day | ORAL | Status: DC

## 2015-04-07 NOTE — Telephone Encounter (Signed)
Received a fax from [facility] requesting Rx to be sent to them, done

## 2015-04-09 ENCOUNTER — Other Ambulatory Visit (INDEPENDENT_AMBULATORY_CARE_PROVIDER_SITE_OTHER): Payer: Medicare Other

## 2015-04-09 DIAGNOSIS — N289 Disorder of kidney and ureter, unspecified: Secondary | ICD-10-CM

## 2015-04-09 LAB — URINALYSIS, ROUTINE W REFLEX MICROSCOPIC
BILIRUBIN URINE: NEGATIVE
HGB URINE DIPSTICK: NEGATIVE
Ketones, ur: NEGATIVE
LEUKOCYTES UA: NEGATIVE
NITRITE: NEGATIVE
Specific Gravity, Urine: 1.01 (ref 1.000–1.030)
Total Protein, Urine: NEGATIVE
URINE GLUCOSE: NEGATIVE
UROBILINOGEN UA: 0.2 (ref 0.0–1.0)
pH: 5.5 (ref 5.0–8.0)

## 2015-04-09 LAB — RENAL FUNCTION PANEL
Albumin: 4.1 g/dL (ref 3.5–5.2)
BUN: 16 mg/dL (ref 6–23)
CHLORIDE: 103 meq/L (ref 96–112)
CO2: 28 mEq/L (ref 19–32)
Calcium: 9 mg/dL (ref 8.4–10.5)
Creatinine, Ser: 1.37 mg/dL — ABNORMAL HIGH (ref 0.40–1.20)
GFR: 47.95 mL/min — ABNORMAL LOW (ref >60.00)
Glucose, Bld: 91 mg/dL (ref 70–99)
PHOSPHORUS: 3.5 mg/dL (ref 2.3–4.6)
POTASSIUM: 4.1 meq/L (ref 3.5–5.1)
SODIUM: 138 meq/L (ref 135–145)

## 2015-05-04 ENCOUNTER — Other Ambulatory Visit: Payer: Self-pay | Admitting: Family Medicine

## 2015-05-12 ENCOUNTER — Other Ambulatory Visit: Payer: Self-pay

## 2015-05-12 DIAGNOSIS — Z1231 Encounter for screening mammogram for malignant neoplasm of breast: Secondary | ICD-10-CM

## 2015-05-22 DIAGNOSIS — Z5181 Encounter for therapeutic drug level monitoring: Secondary | ICD-10-CM | POA: Diagnosis not present

## 2015-05-22 DIAGNOSIS — M792 Neuralgia and neuritis, unspecified: Secondary | ICD-10-CM | POA: Diagnosis not present

## 2015-05-22 DIAGNOSIS — Z79899 Other long term (current) drug therapy: Secondary | ICD-10-CM | POA: Diagnosis not present

## 2015-05-22 DIAGNOSIS — Z8673 Personal history of transient ischemic attack (TIA), and cerebral infarction without residual deficits: Secondary | ICD-10-CM | POA: Diagnosis not present

## 2015-05-26 ENCOUNTER — Encounter: Payer: Self-pay | Admitting: Family Medicine

## 2015-05-26 ENCOUNTER — Ambulatory Visit (INDEPENDENT_AMBULATORY_CARE_PROVIDER_SITE_OTHER): Payer: Medicare Other | Admitting: Family Medicine

## 2015-05-26 VITALS — BP 166/78 | HR 64 | Temp 97.8°F | Ht 62.0 in | Wt 157.8 lb

## 2015-05-26 DIAGNOSIS — I63531 Cerebral infarction due to unspecified occlusion or stenosis of right posterior cerebral artery: Secondary | ICD-10-CM

## 2015-05-26 DIAGNOSIS — I10 Essential (primary) hypertension: Secondary | ICD-10-CM | POA: Diagnosis not present

## 2015-05-26 MED ORDER — HYDROCHLOROTHIAZIDE 25 MG PO TABS: 25.0000 mg | ORAL_TABLET | Freq: Every day | ORAL | Status: DC

## 2015-05-26 NOTE — Patient Instructions (Signed)
Continue all your current medicines  Add HCTZ 25 mg in the morning - to get better control of blood pressure It will make you urinate more right after you take it  Keep drinking water  Keep eating a healthy low sodium diet - look at the [facility] eating handout  Stay as active as you can  If any issues or problems -please let us know  Follow up with me in about 1 month

## 2015-05-26 NOTE — Progress Notes (Signed)
Pre visit review using our clinic review tool, if applicable. No additional management support is needed unless otherwise documented below in the visit note. 

## 2015-05-26 NOTE — Assessment & Plan Note (Signed)
bp was quite elevated at recent [facility] f/u  Pt dealing with L leg/foot discomfort as well- gabapentin was px by neuro Will work on bp by adding hctz 25   No new symptoms  Continue to follow  Walking with a cane

## 2015-05-26 NOTE — Progress Notes (Signed)
Subjective:    Patient ID: Martha Ortega, female    DOB: [birth year], 81 y.o.   MRN: [id number]  HPI Here for f/u of elevated bp  Hx of hypertension  On lisinopril 10 mg and toprol xl 50   In the past -allergic to amlodipine  Hx of elevated cr after hospitalization Hx of renal insuff States she drinks more water now  Lab Results  Component Value Date   CREATININE 1.37* 04/09/2015   BUN 16 04/09/2015   NA 138 04/09/2015   K 4.1 04/09/2015   CL 103 04/09/2015   CO2 28 04/09/2015     Hx of CVA in the past- goes to neuro at [place] has a tight feeling under her L foot and spasm in her legs - given gabapentin for that   At f/u with neuro at [facility] bp was found to be 194/97  (Dr Marlyce Huge)  At home stated it was in [id number] systolic Before she left the house 147/77 - that is not bad    BP Readings from Last 3 Encounters:  05/26/15 166/78  03/02/15 135/85  09/23/13 130/85     Not eating a lot of sodium -she thinks Does not eat out very often  Does drink a lot of water also  Some exercise - she still does lower body exercise   Patient Active Problem List   Diagnosis Date Noted  . CVA (cerebral vascular accident) ([place]) 03/02/2015  . Encounter for Medicare annual wellness exam 09/23/2013  . Spinal stenosis of lumbar region 09/23/2013  . Viral URI with cough 09/23/2013  . Pain in left hip 07/23/2012  . GERD (gastroesophageal reflux disease) 12/12/2011  . RENAL INSUFFICIENCY 11/26/2008  . ALLERGIC RHINITIS 09/10/2007  . Hyperlipidemia 09/08/2006  . Essential hypertension 09/08/2006  . POSTNASAL DRIP SYNDROME 09/08/2006  . GERD 09/08/2006  . DIVERTICULITIS, HX OF 09/08/2006   Past Medical History  Diagnosis Date  . History of diverticulitis of colon   . Hyperlipidemia   . Hypertension   . Allergic rhinitis   . Hyperglycemia 09    mild  . Abnormal blood findings     baseline cr 1.3, watching for renal insufficiency   Past Surgical History  Procedure  Laterality Date  . Appendectomy    . Abdominal hysterectomy    . Cystectomy  93    R axilla  . Cystectomy  97    R breast  . Cardiovascular stress test  07/21/04  . Barium swallow  10/18/03   Social History  Substance Use Topics  . Smoking status: Former Smoker    Quit date: 04/25/1978  . Smokeless tobacco: None  . Alcohol Use: No   Family History  Problem Relation Age of Onset  . Arthritis Mother   . Arthritis Sister   . Lupus Sister     ?  . Diabetes Sister   . Prostate cancer Brother   . Stroke Father   . Hypertension Father     ?   Allergies  Allergen Reactions  . Amlodipine Swelling    Throat and mouth swelling  . Fluticasone Propionate     REACTION: does not help  . Loratadine     REACTION: does not work  . Lovastatin     REACTION: was not effective  . Omeprazole Swelling    Throat, lips, and mouth swelling  . Rosuvastatin     REACTION: GI side eff  . Zocor [Simvastatin - High Dose]     Mouth  and throat swelled   Current Outpatient Prescriptions on File Prior to Visit  Medication Sig Dispense Refill  . aspirin 81 MG tablet Take 81 mg by mouth daily.    Marland Kitchen lisinopril (PRINIVIL,ZESTRIL) 10 MG tablet Take 1 tablet (10 mg total) by mouth daily. 90 tablet 3  . rosuvastatin (CRESTOR) 5 MG tablet Take 1 tablet by mouth daily.    . TOPROL XL 50 MG 24 hr tablet TAKE 1 TABLET DAILY WITH OR IMMEDIATELY FOLLOWING A MEAL 90 tablet 2   No current facility-administered medications on file prior to visit.    Review of Systems Review of Systems  Constitutional: Negative for fever, appetite change, fatigue and unexpected weight change.  Eyes: Negative for pain and visual disturbance.  Respiratory: Negative for cough and shortness of breath.   Cardiovascular: Negative for cp or palpitations    Gastrointestinal: Negative for nausea, diarrhea and constipation.  Genitourinary: Negative for urgency and frequency.  Skin: Negative for pallor or rash   Neurological: Negative  for weakness, light-headedness, numbness and headaches.  Hematological: Negative for adenopathy. Does not bruise/bleed easily.  Psychiatric/Behavioral: Negative for dysphoric mood. The patient is not nervous/anxious.         Objective:   Physical Exam  Constitutional: She appears well-developed and well-nourished. No distress.  overwt and well appearing   HENT:  Head: Normocephalic and atraumatic.  Mouth/Throat: Oropharynx is clear and moist.  Eyes: Conjunctivae and EOM are normal. Pupils are equal, round, and reactive to light.  Neck: Normal range of motion. Neck supple. No JVD present. Carotid bruit is not present. No thyromegaly present.  Cardiovascular: Normal rate, regular rhythm, normal heart sounds and intact distal pulses.  Exam reveals no gallop.   Pulmonary/Chest: Effort normal and breath sounds normal. No respiratory distress. She has no wheezes. She has no rales.  No crackles  Abdominal: Soft. Bowel sounds are normal. She exhibits no distension, no abdominal bruit and no mass. There is no tenderness.  Musculoskeletal: She exhibits no edema.  Lymphadenopathy:    She has no cervical adenopathy.  Neurological: She is alert. She has normal reflexes. No cranial nerve deficit. Coordination normal.  Dec strength L leg -baseline  Walks with a cane  No new findings  Skin: Skin is warm and dry. No rash noted. No pallor.  Psychiatric: She has a normal mood and affect.          Assessment & Plan:   Problem List Items Addressed This Visit      Cardiovascular and Mediastinum   CVA (cerebral vascular accident) ([place])    bp was quite elevated at recent [place] f/u  Pt dealing with L leg/foot discomfort as well- gabapentin was px by neuro Will work on bp by adding hctz 25   No new symptoms  Continue to follow  Walking with a cane       Relevant Medications   hydrochlorothiazide (HYDRODIURIL) 25 MG tablet   Essential hypertension - Primary    Recent significant increase in  bp (very high in neuro office) Disc goals for her in light of prev CVA Will add hctz 25 mg (currently on lisinopril and metoprolol) Will watch renal status on this - lab at f/u  Enc to watch at home  Update if side eff  F/u and lab at 1 mo f/u Also given copy of DASH eating plan and reviewed it       Relevant Medications   hydrochlorothiazide (HYDRODIURIL) 25 MG tablet

## 2015-05-26 NOTE — Assessment & Plan Note (Signed)
Recent significant increase in bp (very high in neuro office) Disc goals for her in light of prev CVA Will add hctz 25 mg (currently on lisinopril and metoprolol) Will watch renal status on this - lab at f/u  Enc to watch at home  Update if side eff  F/u and lab at 1 mo f/u Also given copy of DASH eating plan and reviewed it

## 2015-06-03 ENCOUNTER — Other Ambulatory Visit: Payer: Self-pay

## 2015-06-03 ENCOUNTER — Ambulatory Visit (INDEPENDENT_AMBULATORY_CARE_PROVIDER_SITE_OTHER): Payer: Medicare Other

## 2015-06-03 VITALS — BP 110/82 | HR 65 | Temp 98.2°F | Ht 63.0 in | Wt 153.0 lb

## 2015-06-03 DIAGNOSIS — Z23 Encounter for immunization: Secondary | ICD-10-CM

## 2015-06-03 DIAGNOSIS — Z0000 Encounter for general adult medical examination without abnormal findings: Secondary | ICD-10-CM | POA: Diagnosis not present

## 2015-06-03 NOTE — Patient Instructions (Addendum)
Please keep mammogram appointment on 06/05/15.  Please discuss DEXA (bone density) scan with PCP on next visit.   PATIENT SMART GOAL: Starting 06/03/2015, I will continue to monitor sodium intake by reading food labels and not consuming more than 1500 mg of sodium daily.

## 2015-06-03 NOTE — Progress Notes (Signed)
Pre visit review using our clinic review tool, if applicable. No additional management support is needed unless otherwise documented below in the visit note. 

## 2015-06-03 NOTE — Progress Notes (Cosign Needed)
I have personally reviewed the Medicare Annual Wellness questionnaire and have noted 1. The patient's medical and social history - reviewed and changes made to chart as needed; lives with son 2. Their use of alcohol, tobacco or illicit drugs - no evidence of alcohol, tobacco, or drug abuse 3. Their current medications and supplements - reviewed and changes made to chart as needed 4. The patient's functional ability:  ADL's - independent fall risks - hx of fall with injury x1 in 2016 related to stroke, ambulates with cane home safety risks - none hearing - passed hearing screen Vision - wears corrective lenses; last eye exam in 2016 with Dr. Claudean Kinds  5. Cognitive function has been assessed and addressed as indicated - passed Mini-Cog screen  6. Diet - regular; no nutritional risks - encouraged to monitor sodium intake  7. Physical activity - exercises regularly  8. Evidence for depression or mood disorders - negative depression screening  The patients weight, height, BMI have been recorded in the chart.  I have made referrals, counseling and provided education to the patient based on review of the above and I have provided the patient with a written personalized care plan for preventive services.  Provider list updated.   Reviewed preventative protocols and updated unless pt declined.  Preventative: Colon cancer screening - N/A Prostate cancer screening - N/A Lung cancer screening - N/A Hepatitis C screening - N/A Breast cancer screening - scheduled 06/05/15 Well woman exam - N/A DEXA scan - discussed Flu shot - 02/06/2015 Tetanus shot - due 11/11/2018  Pneumonia shot -  06/03/15 Shingles shot - 04/25/2000 Advanced directive discussion - has not completed paperwork  See scanned questionnaire as needed for further documentation.

## 2015-06-04 NOTE — Progress Notes (Signed)
   Subjective:    Patient ID: Martha Ortega, female    DOB: Jul 05, 1934, 81 y.o.   MRN: [id number]  HPI    Review of Systems     Objective:   Physical Exam        Assessment & Plan:  Note reviewed by Loura Pardon MD and agree with plan

## 2015-06-04 NOTE — Assessment & Plan Note (Signed)
Note reviewed and agree with plan

## 2015-06-05 ENCOUNTER — Ambulatory Visit
Admission: RE | Admit: 2015-06-05 | Discharge: 2015-06-05 | Disposition: A | Discharge status: Home / Self Care | Payer: Medicare Other | Source: Ambulatory Visit

## 2015-06-05 DIAGNOSIS — Z1231 Encounter for screening mammogram for malignant neoplasm of breast: Secondary | ICD-10-CM | POA: Diagnosis not present

## 2015-06-08 ENCOUNTER — Other Ambulatory Visit: Payer: Self-pay | Admitting: Family Medicine

## 2015-06-08 DIAGNOSIS — R928 Other abnormal and inconclusive findings on diagnostic imaging of breast: Secondary | ICD-10-CM

## 2015-06-12 ENCOUNTER — Ambulatory Visit
Admission: RE | Admit: 2015-06-12 | Discharge: 2015-06-12 | Disposition: A | Discharge status: Home / Self Care | Payer: Medicare Other | Source: Ambulatory Visit | Attending: Family Medicine | Admitting: Family Medicine

## 2015-06-12 DIAGNOSIS — R928 Other abnormal and inconclusive findings on diagnostic imaging of breast: Secondary | ICD-10-CM | POA: Diagnosis not present

## 2015-06-23 ENCOUNTER — Ambulatory Visit (INDEPENDENT_AMBULATORY_CARE_PROVIDER_SITE_OTHER): Payer: Medicare Other | Admitting: Family Medicine

## 2015-06-23 ENCOUNTER — Encounter: Payer: Self-pay | Admitting: Family Medicine

## 2015-06-23 VITALS — BP 130/70 | HR 75 | Temp 98.1°F | Ht 62.0 in | Wt 157.5 lb

## 2015-06-23 DIAGNOSIS — I10 Essential (primary) hypertension: Secondary | ICD-10-CM | POA: Diagnosis not present

## 2015-06-23 DIAGNOSIS — I63531 Cerebral infarction due to unspecified occlusion or stenosis of right posterior cerebral artery: Secondary | ICD-10-CM | POA: Diagnosis not present

## 2015-06-23 DIAGNOSIS — E2839 Other primary ovarian failure: Secondary | ICD-10-CM | POA: Diagnosis not present

## 2015-06-23 DIAGNOSIS — N289 Disorder of kidney and ureter, unspecified: Secondary | ICD-10-CM

## 2015-06-23 LAB — BASIC METABOLIC PANEL
BUN: 36 mg/dL — AB (ref 6–23)
CHLORIDE: 109 meq/L (ref 96–112)
CO2: 27 mEq/L (ref 19–32)
CREATININE: 1.73 mg/dL — AB (ref 0.40–1.20)
Calcium: 9.4 mg/dL (ref 8.4–10.5)
GFR: 36.61 mL/min — AB (ref >60.00)
Glucose, Bld: 112 mg/dL — ABNORMAL HIGH (ref 70–99)
Potassium: 4.4 mEq/L (ref 3.5–5.1)
Sodium: 142 mEq/L (ref 135–145)

## 2015-06-23 NOTE — Progress Notes (Signed)
Subjective:    Patient ID: Martha Ortega, female    DOB: Apr 05, 1935, 81 y.o.   MRN: [id number]  HPI Here for f/u of HTN   Last visit added hctz 25 mg  BP Readings from Last 3 Encounters:  06/23/15 132/76  06/03/15 110/82  05/26/15 166/78    No problems or side effects  She is drinking water all day  Given DASH eating plan at last visit - cut out a lot of sodium from her diet  Also eating higher fiber thinks   Exercise - is a challenge   Has neuro f/u in May- still c/o from her CVA - really bothers her   Had her amw visit with the nurse and she was told to ask about a bone density test  Never had one before No falls or fx No hx of OP Not on ca or D right now  Would be interested in screening for OP   Patient Active Problem List   Diagnosis Date Noted  . CVA (cerebral vascular accident) ([place]) 03/02/2015  . Encounter for Medicare annual wellness exam 09/23/2013  . Spinal stenosis of lumbar region 09/23/2013  . Viral URI with cough 09/23/2013  . Pain in left hip 07/23/2012  . GERD (gastroesophageal reflux disease) 12/12/2011  . Renal insufficiency 11/26/2008  . ALLERGIC RHINITIS 09/10/2007  . Hyperlipidemia 09/08/2006  . Essential hypertension 09/08/2006  . POSTNASAL DRIP SYNDROME 09/08/2006  . GERD 09/08/2006  . DIVERTICULITIS, HX OF 09/08/2006   Past Medical History  Diagnosis Date  . History of diverticulitis of colon   . Hyperlipidemia   . Hypertension   . Allergic rhinitis   . Hyperglycemia 09    mild  . Abnormal blood findings     baseline cr 1.3, watching for renal insufficiency   Past Surgical History  Procedure Laterality Date  . Appendectomy    . Abdominal hysterectomy    . Cystectomy  93    R axilla  . Cystectomy  97    R breast  . Cardiovascular stress test  07/21/04  . Barium swallow  10/18/03   Social History  Substance Use Topics  . Smoking status: Former Smoker    Quit date: 04/25/1978  . Smokeless tobacco: None  . Alcohol Use:  No   Family History  Problem Relation Age of Onset  . Arthritis Mother   . Arthritis Sister   . Lupus Sister     ?  . Diabetes Sister   . Prostate cancer Brother   . Stroke Father   . Hypertension Father     ?   Allergies  Allergen Reactions  . Amlodipine Swelling    Throat and mouth swelling  . Fluticasone Propionate     REACTION: does not help  . Loratadine     REACTION: does not work  . Lovastatin     REACTION: was not effective  . Omeprazole Swelling    Throat, lips, and mouth swelling  . Rosuvastatin     REACTION: GI side eff  . Zocor [Simvastatin - High Dose]     Mouth and throat swelled   Current Outpatient Prescriptions on File Prior to Visit  Medication Sig Dispense Refill  . aspirin 81 MG tablet Take 81 mg by mouth daily.    Marland Kitchen gabapentin (NEURONTIN) 300 MG capsule Take one tablet on night for 5 days, then increase to twice a day for 5 days then increase to three times a day    . hydrochlorothiazide (  HYDRODIURIL) 25 MG tablet Take 1 tablet (25 mg total) by mouth daily. 30 tablet 11  . lisinopril (PRINIVIL,ZESTRIL) 10 MG tablet Take 1 tablet (10 mg total) by mouth daily. 90 tablet 3  . rosuvastatin (CRESTOR) 5 MG tablet Take 1 tablet by mouth daily.    . TOPROL XL 50 MG 24 hr tablet TAKE 1 TABLET DAILY WITH OR IMMEDIATELY FOLLOWING A MEAL 90 tablet 2   No current facility-administered medications on file prior to visit.    Review of Systems Review of Systems  Constitutional: Negative for fever, appetite change, fatigue and unexpected weight change.  Eyes: Negative for pain and visual disturbance.  Respiratory: Negative for cough and shortness of breath.   Cardiovascular: Negative for cp or palpitations    Gastrointestinal: Negative for nausea, diarrhea and constipation.  Genitourinary: Negative for urgency and frequency.  Skin: Negative for pallor or rash   Neurological: Negative for weakness, light-headedness, numbness and headaches. pos for funny feeling  in R arm/upper body with some weakness from her CVA-which is very slowly improving (f/u neuro in May) Hematological: Negative for adenopathy. Does not bruise/bleed easily.  Psychiatric/Behavioral: Negative for dysphoric mood. The patient is not nervous/anxious.        Objective:   Physical Exam  Constitutional: She appears well-developed and well-nourished. No distress.  Well appearing   HENT:  Head: Normocephalic and atraumatic.  Mouth/Throat: Oropharynx is clear and moist.  Eyes: Conjunctivae and EOM are normal. Pupils are equal, round, and reactive to light.  Neck: Normal range of motion. Neck supple. No JVD present. Carotid bruit is not present. No thyromegaly present.  Cardiovascular: Normal rate, regular rhythm, normal heart sounds and intact distal pulses.  Exam reveals no gallop.   Pulmonary/Chest: Effort normal and breath sounds normal. No respiratory distress. She has no wheezes. She has no rales.  No crackles  Abdominal: Soft. Bowel sounds are normal. She exhibits no distension, no abdominal bruit and no mass. There is no tenderness.  Musculoskeletal: She exhibits no edema.  Lymphadenopathy:    She has no cervical adenopathy.  Neurological: She is alert. She has normal reflexes.  Baseline guarding of LUE - coordination is improving however as is gait   Skin: Skin is warm and dry. No rash noted.  Psychiatric: She has a normal mood and affect.          Assessment & Plan:   Problem List Items Addressed This Visit      Cardiovascular and Mediastinum   Essential hypertension - Primary    bp in fair control at this time  BP Readings from Last 1 Encounters:  06/23/15 130/70   No changes needed Disc lifstyle change with low sodium diet and exercise  Improved with addn of hctz Lab today  Fu with neuro as planned for CVA      Relevant Orders   Basic metabolic panel (Completed)     Genitourinary   Renal insufficiency    Lab today  Enc good water intake with addn  of hctz  Disc imp of good bp control as well       Relevant Orders   Basic metabolic panel (Completed)     Other   Estrogen deficiency    Ref for dexa       Relevant Orders   DG Bone Density

## 2015-06-23 NOTE — Assessment & Plan Note (Signed)
L sided symptoms are gradually improving per pt  She has neuro f/u in May Will continue to work on risk factors- HTN is improved today

## 2015-06-23 NOTE — Progress Notes (Signed)
Pre visit review using our clinic review tool, if applicable. No additional management support is needed unless otherwise documented below in the visit note. 

## 2015-06-23 NOTE — Patient Instructions (Addendum)
Labs today  Blood pressure is good Continue current medications  Stop at check out for referral for a bone density test   For bone health     Try to get 1200-1500 mg of calcium per day with at least 1000 iu of vitamin D - for bone health

## 2015-06-23 NOTE — Assessment & Plan Note (Signed)
Lab today  Enc good water intake with addn of hctz  Disc imp of good bp control as well

## 2015-06-23 NOTE — Assessment & Plan Note (Signed)
bp in fair control at this time  BP Readings from Last 1 Encounters:  06/23/15 130/70   No changes needed Disc lifstyle change with low sodium diet and exercise  Improved with addn of hctz Lab today  Fu with neuro as planned for CVA

## 2015-06-23 NOTE — Assessment & Plan Note (Signed)
Ref for dexa 

## 2015-06-26 ENCOUNTER — Telehealth: Payer: Self-pay | Admitting: Family Medicine

## 2015-06-26 DIAGNOSIS — N289 Disorder of kidney and ureter, unspecified: Secondary | ICD-10-CM

## 2015-06-26 DIAGNOSIS — I10 Essential (primary) hypertension: Secondary | ICD-10-CM

## 2015-06-26 NOTE — Telephone Encounter (Signed)
Ref done Will route to [facility]

## 2015-06-26 NOTE — Telephone Encounter (Signed)
-----   Message from Tammi Sou, [place] sent at 06/26/2015 12:31 PM EST ----- Pt notified of lab results and Dr. Marliss Coots instructions. Pt will stop taking the HCTZ and she does agree with referral to nephrologist. I advise her that Dr. Glori Bickers will put referral in and one of our [facility] will call her to schedule appt

## 2015-07-07 ENCOUNTER — Other Ambulatory Visit: Payer: Medicare Other

## 2015-07-20 ENCOUNTER — Ambulatory Visit
Admission: RE | Admit: 2015-07-20 | Discharge: 2015-07-20 | Disposition: A | Discharge status: Home / Self Care | Payer: Medicare Other | Source: Ambulatory Visit | Attending: Family Medicine | Admitting: Family Medicine

## 2015-07-20 DIAGNOSIS — M8589 Other specified disorders of bone density and structure, multiple sites: Secondary | ICD-10-CM | POA: Diagnosis not present

## 2015-07-20 DIAGNOSIS — Z780 Asymptomatic menopausal state: Secondary | ICD-10-CM | POA: Diagnosis not present

## 2015-07-20 DIAGNOSIS — E2839 Other primary ovarian failure: Secondary | ICD-10-CM

## 2015-07-20 LAB — HM DEXA SCAN: HM Dexa Scan: BORDERLINE

## 2015-07-26 ENCOUNTER — Encounter: Payer: Self-pay | Admitting: Family Medicine

## 2015-07-26 DIAGNOSIS — M8580 Other specified disorders of bone density and structure, unspecified site: Secondary | ICD-10-CM | POA: Insufficient documentation

## 2015-07-27 ENCOUNTER — Encounter: Payer: Self-pay | Admitting: *Deleted

## 2015-08-05 DIAGNOSIS — E785 Hyperlipidemia, unspecified: Secondary | ICD-10-CM | POA: Diagnosis not present

## 2015-08-05 DIAGNOSIS — N183 Chronic kidney disease, stage 3 (moderate): Secondary | ICD-10-CM | POA: Diagnosis not present

## 2015-08-05 DIAGNOSIS — I129 Hypertensive chronic kidney disease with stage 1 through stage 4 chronic kidney disease, or unspecified chronic kidney disease: Secondary | ICD-10-CM | POA: Diagnosis not present

## 2015-08-05 DIAGNOSIS — Z8673 Personal history of transient ischemic attack (TIA), and cerebral infarction without residual deficits: Secondary | ICD-10-CM | POA: Diagnosis not present

## 2015-08-12 ENCOUNTER — Other Ambulatory Visit (HOSPITAL_COMMUNITY): Payer: Self-pay | Admitting: Nephrology

## 2015-08-12 DIAGNOSIS — I10 Essential (primary) hypertension: Secondary | ICD-10-CM

## 2015-08-12 DIAGNOSIS — N289 Disorder of kidney and ureter, unspecified: Secondary | ICD-10-CM

## 2015-08-14 ENCOUNTER — Inpatient Hospital Stay (HOSPITAL_COMMUNITY): Admission: RE | Admit: 2015-08-14 | Payer: Medicare Other | Source: Ambulatory Visit

## 2015-08-21 ENCOUNTER — Ambulatory Visit (HOSPITAL_COMMUNITY)
Admission: RE | Admit: 2015-08-21 | Discharge: 2015-08-21 | Disposition: A | Discharge status: Home / Self Care | Payer: Medicare Other | Source: Ambulatory Visit | Attending: Cardiovascular Disease | Admitting: Cardiovascular Disease

## 2015-08-21 DIAGNOSIS — K7689 Other specified diseases of liver: Secondary | ICD-10-CM | POA: Insufficient documentation

## 2015-08-21 DIAGNOSIS — I10 Essential (primary) hypertension: Secondary | ICD-10-CM | POA: Diagnosis not present

## 2015-08-21 DIAGNOSIS — E785 Hyperlipidemia, unspecified: Secondary | ICD-10-CM | POA: Diagnosis not present

## 2015-08-21 DIAGNOSIS — N289 Disorder of kidney and ureter, unspecified: Secondary | ICD-10-CM | POA: Diagnosis not present

## 2015-08-21 DIAGNOSIS — N183 Chronic kidney disease, stage 3 (moderate): Secondary | ICD-10-CM | POA: Diagnosis not present

## 2015-08-28 DIAGNOSIS — E785 Hyperlipidemia, unspecified: Secondary | ICD-10-CM | POA: Diagnosis not present

## 2015-08-28 DIAGNOSIS — Z8673 Personal history of transient ischemic attack (TIA), and cerebral infarction without residual deficits: Secondary | ICD-10-CM | POA: Diagnosis not present

## 2015-08-28 DIAGNOSIS — I10 Essential (primary) hypertension: Secondary | ICD-10-CM | POA: Diagnosis not present

## 2015-08-28 DIAGNOSIS — G890 Central pain syndrome: Secondary | ICD-10-CM | POA: Diagnosis not present

## 2015-09-08 DIAGNOSIS — Z8673 Personal history of transient ischemic attack (TIA), and cerebral infarction without residual deficits: Secondary | ICD-10-CM | POA: Diagnosis not present

## 2015-09-08 DIAGNOSIS — N183 Chronic kidney disease, stage 3 (moderate): Secondary | ICD-10-CM | POA: Diagnosis not present

## 2015-09-08 DIAGNOSIS — E785 Hyperlipidemia, unspecified: Secondary | ICD-10-CM | POA: Diagnosis not present

## 2015-09-08 DIAGNOSIS — I129 Hypertensive chronic kidney disease with stage 1 through stage 4 chronic kidney disease, or unspecified chronic kidney disease: Secondary | ICD-10-CM | POA: Diagnosis not present

## 2015-10-06 DIAGNOSIS — E785 Hyperlipidemia, unspecified: Secondary | ICD-10-CM | POA: Diagnosis not present

## 2015-10-06 DIAGNOSIS — I129 Hypertensive chronic kidney disease with stage 1 through stage 4 chronic kidney disease, or unspecified chronic kidney disease: Secondary | ICD-10-CM | POA: Diagnosis not present

## 2015-10-06 DIAGNOSIS — Z8673 Personal history of transient ischemic attack (TIA), and cerebral infarction without residual deficits: Secondary | ICD-10-CM | POA: Diagnosis not present

## 2015-10-06 DIAGNOSIS — N183 Chronic kidney disease, stage 3 (moderate): Secondary | ICD-10-CM | POA: Diagnosis not present

## 2015-10-06 DIAGNOSIS — E669 Obesity, unspecified: Secondary | ICD-10-CM | POA: Diagnosis not present

## 2016-01-28 ENCOUNTER — Encounter: Payer: Self-pay | Admitting: Family Medicine

## 2016-01-29 ENCOUNTER — Other Ambulatory Visit: Payer: Self-pay | Admitting: Family Medicine

## 2016-01-29 DIAGNOSIS — Z23 Encounter for immunization: Secondary | ICD-10-CM | POA: Diagnosis not present

## 2016-01-29 DIAGNOSIS — N183 Chronic kidney disease, stage 3 (moderate): Secondary | ICD-10-CM | POA: Diagnosis not present

## 2016-01-29 DIAGNOSIS — I129 Hypertensive chronic kidney disease with stage 1 through stage 4 chronic kidney disease, or unspecified chronic kidney disease: Secondary | ICD-10-CM | POA: Diagnosis not present

## 2016-01-29 DIAGNOSIS — E669 Obesity, unspecified: Secondary | ICD-10-CM | POA: Diagnosis not present

## 2016-01-29 DIAGNOSIS — E785 Hyperlipidemia, unspecified: Secondary | ICD-10-CM | POA: Diagnosis not present

## 2016-01-29 DIAGNOSIS — N2581 Secondary hyperparathyroidism of renal origin: Secondary | ICD-10-CM | POA: Diagnosis not present

## 2016-01-31 ENCOUNTER — Telehealth: Payer: Self-pay | Admitting: Family Medicine

## 2016-01-31 DIAGNOSIS — I63531 Cerebral infarction due to unspecified occlusion or stenosis of right posterior cerebral artery: Secondary | ICD-10-CM

## 2016-01-31 NOTE — Telephone Encounter (Signed)
Pt wants to change neurologist to closer/ [place] Will route to [facility]

## 2016-03-14 ENCOUNTER — Other Ambulatory Visit: Payer: Self-pay | Admitting: Family Medicine

## 2016-03-14 NOTE — Telephone Encounter (Signed)
No recent/future appts., please advise  

## 2016-03-14 NOTE — Telephone Encounter (Signed)
Please schedule 30 min appt for March or later and refill until that f/u  thanks

## 2016-03-15 NOTE — Telephone Encounter (Signed)
appt scheduled and med refilled 

## 2016-04-04 DIAGNOSIS — N184 Chronic kidney disease, stage 4 (severe): Secondary | ICD-10-CM | POA: Diagnosis not present

## 2016-04-20 ENCOUNTER — Encounter: Payer: Self-pay | Admitting: Neurology

## 2016-04-20 ENCOUNTER — Ambulatory Visit (INDEPENDENT_AMBULATORY_CARE_PROVIDER_SITE_OTHER): Payer: Medicare Other | Admitting: Neurology

## 2016-04-20 VITALS — BP 128/88 | HR 78 | Ht 62.0 in | Wt 160.0 lb

## 2016-04-20 DIAGNOSIS — Z8673 Personal history of transient ischemic attack (TIA), and cerebral infarction without residual deficits: Secondary | ICD-10-CM | POA: Diagnosis not present

## 2016-04-20 DIAGNOSIS — I63531 Cerebral infarction due to unspecified occlusion or stenosis of right posterior cerebral artery: Secondary | ICD-10-CM | POA: Diagnosis not present

## 2016-04-20 DIAGNOSIS — I10 Essential (primary) hypertension: Secondary | ICD-10-CM

## 2016-04-20 DIAGNOSIS — G890 Central pain syndrome: Secondary | ICD-10-CM | POA: Diagnosis not present

## 2016-04-20 MED ORDER — PREGABALIN 75 MG PO CAPS: 75.0000 mg | ORAL_CAPSULE | Freq: Two times a day (BID) | ORAL | 0 refills | Status: DC

## 2016-04-20 NOTE — Patient Instructions (Signed)
1.  For the tightness sensation, we will try Lyrica 75mg  twice daily.  Contact me in 4 weeks with update. 2.  Continue aspirin 81mg  daily 3.  Continue Crestor 20mg  daily 4.  Continue blood pressure control.

## 2016-04-20 NOTE — Progress Notes (Signed)
NEUROLOGY CONSULTATION NOTE  Martha Ortega MRN: [id number] DOB: [birth year]  Referring provider: Dr. Glori Bickers Primary care provider: Dr. Glori Bickers  Reason for consult:  stroke  HISTORY OF PRESENT ILLNESS: Martha Ortega is a 81 year old right-handed African American female with hypertension and lumbar stenosis who presents to establish care for stroke.  History supplemented by prior neurologist's notes.  She was admitted to [facility] in October 2016 for a tight sensation involving the left side of her body.  She had MRI of her neuro-axis.  As per reports, her spine was unremarkable but the brain revealed an acute right thalamic infarct.  Carotid doppler revealed no hemodynamically significant stenosis.  LDL was 161.  Hgb A1c was 5.3.  She takes ASA 81mg  daily for secondary stroke prevention. She takes Crestor 20mg  daily.  She reports angioedema with other statins.  Last LDL from 03/02/15 was 97. She takes metoprolol succinate for blood pressure control. She was taking gabapentin for the "tight" sensation on the left side of her body.  It has not helped.  PAST MEDICAL HISTORY: Past Medical History:  Diagnosis Date  . Abnormal blood findings    baseline cr 1.3, watching for renal insufficiency  . Allergic rhinitis   . History of diverticulitis of colon   . Hyperglycemia 09   mild  . Hyperlipidemia   . Hypertension   . Stroke (cerebrum) ([place])     PAST SURGICAL HISTORY: Past Surgical History:  Procedure Laterality Date  . ABDOMINAL HYSTERECTOMY    . APPENDECTOMY     Patient states not sure if this is true  . barium swallow  10/18/03  . CARDIOVASCULAR STRESS TEST  07/21/04  . CYSTECTOMY  93   R axilla  . CYSTECTOMY  97   R breast    MEDICATIONS: Current Outpatient Prescriptions on File Prior to Visit  Medication Sig Dispense Refill  . aspirin 81 MG tablet Take 81 mg by mouth daily.    . TOPROL XL 50 MG 24 hr tablet TAKE 1 TABLET DAILY WITH OR IMMEDIATELY FOLLOWING A MEAL 90  tablet 0  . hydrochlorothiazide (HYDRODIURIL) 25 MG tablet Take 1 tablet (25 mg total) by mouth daily. (Patient not taking: Reported on 04/20/2016) 30 tablet 11  . rosuvastatin (CRESTOR) 5 MG tablet Take 1 tablet by mouth daily.     No current facility-administered medications on file prior to visit.     ALLERGIES: Allergies  Allergen Reactions  . Amlodipine Swelling    Throat and mouth swelling  . Fluticasone Propionate     REACTION: does not help  . Loratadine     REACTION: does not work  . Lovastatin     REACTION: was not effective  . Omeprazole Swelling    Throat, lips, and mouth swelling  . Rosuvastatin     REACTION: GI side eff  . Zocor [Simvastatin - High Dose]     Mouth and throat swelled    FAMILY HISTORY: Family History  Problem Relation Age of Onset  . Arthritis Mother   . Arthritis Sister   . Lupus Sister     ?  . Diabetes Sister   . Prostate cancer Brother   . Stroke Father   . Hypertension Father     ?    SOCIAL HISTORY: Social History   Social History  . Marital status: Widowed    Spouse name: N/A  . Number of children: N/A  . Years of education: N/A   Occupational History  . unemployed  Social History Main Topics  . Smoking status: Former Smoker    Quit date: 04/25/1978  . Smokeless tobacco: Not on file  . Alcohol use No  . Drug use: No  . Sexual activity: Not on file   Other Topics Concern  . Not on file   Social History Narrative   Widowed, not working, son lives close by   Regular exercise: walking videos 3-4 times weekly             REVIEW OF SYSTEMS: Constitutional: No fevers, chills, or sweats, no generalized fatigue, change in appetite Eyes: No visual changes, double vision, eye pain Ear, nose and throat: No hearing loss, ear pain, nasal congestion, sore throat Cardiovascular: No chest pain, palpitations Respiratory:  No shortness of breath at rest or with exertion, wheezes GastrointestinaI: No nausea, vomiting,  diarrhea, abdominal pain, fecal incontinence Genitourinary:  No dysuria, urinary retention or frequency Musculoskeletal:  No neck pain, back pain Integumentary: No rash, pruritus, skin lesions Neurological: as above Psychiatric: No depression, insomnia, anxiety Endocrine: No palpitations, fatigue, diaphoresis, mood swings, change in appetite, change in weight, increased thirst Hematologic/Lymphatic:  No purpura, petechiae. Allergic/Immunologic: no itchy/runny eyes, nasal congestion, recent allergic reactions, rashes  PHYSICAL EXAM: Vitals:   04/20/16 0934  BP: 128/88  Pulse: 78   General: No acute distress.  Patient appears well-groomed.  Head:  Normocephalic/atraumatic Eyes:  fundi examined but not visualized Neck: supple, no paraspinal tenderness, full range of motion Back: No paraspinal tenderness Heart: regular rate and rhythm Lungs: Clear to auscultation bilaterally. Vascular: No carotid bruits. Neurological Exam: Mental status: alert and oriented to person, place, and time, recent and remote memory intact, fund of knowledge intact, attention and concentration intact, speech fluent and not dysarthric, language intact. Cranial nerves: CN I: not tested CN II: pupils equal, round and reactive to light, visual fields intact CN III, IV, VI:  full range of motion, no nystagmus, no ptosis CN V: facial sensation intact CN VII: upper and lower face symmetric CN VIII: hearing intact CN IX, X: gag intact, uvula midline CN XI: sternocleidomastoid and trapezius muscles intact CN XII: tongue midline Bulk & Tone: normal, no fasciculations. Motor:  5/5 throughout Sensation: temperature and vibration sensation intact. Deep Tendon Reflexes:  2+ throughout, toes downgoing.  Finger to nose testing:  Without dysmetria.  Heel to shin:  Without dysmetria.  Gait:  Slight left limp due to tight sensation.  Able to turn and tandem walk. Romberg negative.  IMPRESSION: 1.  Left sided tight  sensation secondary to right thalamic stroke.  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 2.  HTN  PLAN: 1.  No further neurology follow up needed regarding her stroke.  Further management may be followed by her PCP.  Continue ASA 81mg  daily for secondary stroke prevention.  Continue blood pressure control.  Continue Crestor (statin therapy) with LDL goal of less than 70. 2.  Regarding the tight sensation, will provide her samples of Lyrica 75mg  twice daily.   Thank you for allowing me to take part in the care of this patient.  Metta Clines, DO  CC:  Loura Pardon, MD

## 2016-04-20 NOTE — Addendum Note (Signed)
Addended byAnnamaria Helling on: 04/20/2016 11:38 AM   Modules accepted: Orders

## 2016-04-28 ENCOUNTER — Other Ambulatory Visit: Payer: Self-pay | Admitting: Family Medicine

## 2016-05-06 ENCOUNTER — Other Ambulatory Visit: Payer: Self-pay | Admitting: Family Medicine

## 2016-05-06 DIAGNOSIS — Z1231 Encounter for screening mammogram for malignant neoplasm of breast: Secondary | ICD-10-CM

## 2016-05-20 ENCOUNTER — Telehealth: Payer: Self-pay | Admitting: Neurology

## 2016-05-20 NOTE — Telephone Encounter (Signed)
PT called and said the medication Dr Tomi Likens gave her was not working/Dawn CB# [id number]

## 2016-05-24 MED ORDER — DULOXETINE HCL 30 MG PO CPEP: 30.0000 mg | ORAL_CAPSULE | Freq: Every day | ORAL | 0 refills | Status: DC

## 2016-05-24 NOTE — Telephone Encounter (Signed)
Gave patient med instructions. Sent Rx to [facility]. Patient verbalized understanding.

## 2016-05-24 NOTE — Telephone Encounter (Signed)
Patient states she finished the Lyrica 75mg  as directed. Was advised to call and give an update. Patient says she does not think the med helped at all. No difference in symptoms reported in OV.

## 2016-05-24 NOTE — Telephone Encounter (Signed)
1.  Instead, we can start Cymbalta 30mg  daily.  It is an antidepressant commonly used for nerve type pain/discomfort.  She should contact us in one month with update.

## 2016-06-06 ENCOUNTER — Ambulatory Visit
Admission: RE | Admit: 2016-06-06 | Discharge: 2016-06-06 | Disposition: A | Discharge status: Home / Self Care | Payer: Medicare Other | Source: Ambulatory Visit | Attending: Family Medicine | Admitting: Family Medicine

## 2016-06-06 DIAGNOSIS — Z1231 Encounter for screening mammogram for malignant neoplasm of breast: Secondary | ICD-10-CM

## 2016-06-12 ENCOUNTER — Other Ambulatory Visit: Payer: Self-pay | Admitting: Family Medicine

## 2016-06-13 ENCOUNTER — Telehealth: Payer: Self-pay | Admitting: Neurology

## 2016-06-13 NOTE — Telephone Encounter (Signed)
Patient called and left message on voicemail about medication making her sick and it not working please call her at [phone]

## 2016-06-13 NOTE — Telephone Encounter (Signed)
She told the neurologist that she was not taking the HCTZ anymore (that's on med list), but she never stopped taking the lisinopril and even in the neurologist note he doesn't state he d/c med, is it okay to fill and d/c the HCTZ off of med list, please advise

## 2016-06-13 NOTE — Telephone Encounter (Signed)
Yes-as long as her bp is stable that is fine -thanks

## 2016-06-13 NOTE — Telephone Encounter (Signed)
It does not look like she is still on it - so I do not think she is supposed to be on it  You can ask the pt if there is confusion and see if the pharmacy indicates she has continued it w/o telling anyone thanks

## 2016-06-13 NOTE — Telephone Encounter (Signed)
done

## 2016-06-13 NOTE — Telephone Encounter (Signed)
It looks like med was d/c by neurologist at last appt on 04/20/16, please advise if pt is suppose to still be on med

## 2016-06-14 ENCOUNTER — Other Ambulatory Visit: Payer: Self-pay | Admitting: *Deleted

## 2016-06-14 MED ORDER — NORTRIPTYLINE HCL 10 MG PO CAPS: 10.0000 mg | ORAL_CAPSULE | Freq: Every day | ORAL | 3 refills | Status: DC

## 2016-06-14 NOTE — Telephone Encounter (Signed)
We can start her on nortriptyline 10mg  at bedtime.  In 4 weeks, we can increase the dose if needed.

## 2016-06-14 NOTE — Telephone Encounter (Signed)
I called patient back and she has agreed to try the nortriptyline.  Rx sent to pharmacy.

## 2016-06-14 NOTE — Telephone Encounter (Signed)
Patient said the duloxetine is making her feel bad and she wants to stay in bed all the time.  It has also caused her to vomit.  Can we try something else?

## 2016-06-28 ENCOUNTER — Ambulatory Visit (INDEPENDENT_AMBULATORY_CARE_PROVIDER_SITE_OTHER): Payer: Medicare Other

## 2016-06-28 ENCOUNTER — Ambulatory Visit (INDEPENDENT_AMBULATORY_CARE_PROVIDER_SITE_OTHER): Payer: Medicare Other | Admitting: Family Medicine

## 2016-06-28 VITALS — BP 148/85 | HR 72 | Temp 98.5°F | Ht 62.0 in | Wt 153.2 lb

## 2016-06-28 VITALS — BP 160/88 | HR 72 | Temp 98.5°F | Ht 62.0 in | Wt 153.2 lb

## 2016-06-28 DIAGNOSIS — I63531 Cerebral infarction due to unspecified occlusion or stenosis of right posterior cerebral artery: Secondary | ICD-10-CM

## 2016-06-28 DIAGNOSIS — M8589 Other specified disorders of bone density and structure, multiple sites: Secondary | ICD-10-CM | POA: Diagnosis not present

## 2016-06-28 DIAGNOSIS — Z0000 Encounter for general adult medical examination without abnormal findings: Secondary | ICD-10-CM | POA: Diagnosis not present

## 2016-06-28 DIAGNOSIS — N289 Disorder of kidney and ureter, unspecified: Secondary | ICD-10-CM

## 2016-06-28 DIAGNOSIS — I10 Essential (primary) hypertension: Secondary | ICD-10-CM | POA: Diagnosis not present

## 2016-06-28 DIAGNOSIS — E7800 Pure hypercholesterolemia, unspecified: Secondary | ICD-10-CM

## 2016-06-28 LAB — COMPREHENSIVE METABOLIC PANEL WITH GFR
ALT: 12 U/L (ref 0–35)
AST: 18 U/L (ref 0–37)
Albumin: 4.3 g/dL (ref 3.5–5.2)
Alkaline Phosphatase: 70 U/L (ref 39–117)
BUN: 16 mg/dL (ref 6–23)
CO2: 28 meq/L (ref 19–32)
Calcium: 9.3 mg/dL (ref 8.4–10.5)
Chloride: 106 meq/L (ref 96–112)
Creatinine, Ser: 1.74 mg/dL — ABNORMAL HIGH (ref 0.40–1.20)
GFR: 36.28 mL/min — ABNORMAL LOW
Glucose, Bld: 99 mg/dL (ref 70–99)
Potassium: 4.4 meq/L (ref 3.5–5.1)
Sodium: 140 meq/L (ref 135–145)
Total Bilirubin: 0.4 mg/dL (ref 0.2–1.2)
Total Protein: 7.5 g/dL (ref 6.0–8.3)

## 2016-06-28 LAB — CBC WITH DIFFERENTIAL/PLATELET
Basophils Absolute: 0 K/uL (ref 0.0–0.1)
Basophils Relative: 0.6 % (ref 0.0–3.0)
Eosinophils Absolute: 0.6 K/uL (ref 0.0–0.7)
Eosinophils Relative: 7.1 % — ABNORMAL HIGH (ref 0.0–5.0)
HCT: 40.1 % (ref 36.0–46.0)
Hemoglobin: 13.1 g/dL (ref 12.0–15.0)
Lymphocytes Relative: 46.7 % — ABNORMAL HIGH (ref 12.0–46.0)
Lymphs Abs: 3.7 K/uL (ref 0.7–4.0)
MCHC: 32.6 g/dL (ref 30.0–36.0)
MCV: 83.6 fl (ref 78.0–100.0)
Monocytes Absolute: 0.5 K/uL (ref 0.1–1.0)
Monocytes Relative: 6.9 % (ref 3.0–12.0)
Neutro Abs: 3.1 K/uL (ref 1.4–7.7)
Neutrophils Relative %: 38.7 % — ABNORMAL LOW (ref 43.0–77.0)
Platelets: 211 K/uL (ref 150.0–400.0)
RBC: 4.79 Mil/uL (ref 3.87–5.11)
RDW: 15.8 % — ABNORMAL HIGH (ref 11.5–15.5)
WBC: 7.9 K/uL (ref 4.0–10.5)

## 2016-06-28 LAB — LIPID PANEL
CHOL/HDL RATIO: 4
Cholesterol: 130 mg/dL (ref 0–200)
HDL: 36 mg/dL — AB (ref >39.00)
LDL Cholesterol: 67 mg/dL (ref 0–99)
NONHDL: 93.64
TRIGLYCERIDES: 132 mg/dL (ref 0.0–149.0)
VLDL: 26.4 mg/dL (ref 0.0–40.0)

## 2016-06-28 LAB — TSH: TSH: 1.88 u[IU]/mL (ref 0.35–4.50)

## 2016-06-28 NOTE — Progress Notes (Signed)
PCP notes:   Health maintenance:  No gaps identified.  Abnormal screenings:   Hearing - failed Mini-Cog score: 18/20  Patient concerns:   Pt reports feeling "tightness" on left side of body.  Nurse concerns:  Pt's BP is elevated. BP 160/88 in right arm. BP 170/98 in left arm.   Next PCP appt:   06/28/16 @ 1115  I reviewed health advisor's note, was available for consultation, and agree with documentation and plan. Loura Pardon MD

## 2016-06-28 NOTE — Progress Notes (Signed)
Subjective:    Patient ID: Martha Ortega, female    DOB: [birth year], 82 y.o.   MRN: [id number]  HPI  Here for annual f/u of chronic health problems   Wt Readings from Last 3 Encounters:  06/28/16 153 lb 4 oz (69.5 kg)  06/28/16 153 lb 4 oz (69.5 kg)  04/20/16 160 lb (72.6 kg)   bmi 28.0 She is doing her exercise  Also eating better   AMW was today  Failed hearing test - she has not noticed it and is not bothered Does not want hearing aides No one around her has complained about her hearing   Mini cog score 18/20  (unable to recall 2 of 3 words)-now she can recall them  Martha Ortega she was not paying attention  Has not noticed any problems at home that are concerning  occ a "little thing"  Not getting lost or confused  Family/frineds have not been concerned    bp was elevated BP Readings from Last 3 Encounters:  06/28/16 (!) 160/88  06/28/16 (!) 160/88  04/20/16 128/88  BP: (!) 148/85 some improvement after sitting      Pt has c/o of feeling "tight on L side of her body)- since stroke in 2016 - is stronger however and doing her exercises from PT  Uses a bike  Also weights   Mammogram 2/18 normal Self exam - no lumps or changes   dexa 3/17- mild to mod osteopenia No falls Fracture hx -none  She takes her vitamin D or calcium   Zoster vaccine - had it already   utd other vaccines   bp is up today No cp or palpitations or headaches or edema  No side effects to medicines  BP Readings from Last 3 Encounters:  06/28/16 (!) 160/88  06/28/16 (!) 160/88  04/20/16 128/88   at home her bp has been much better 140s-160s/60s  She took her bp med this am     She has hx of CVA at [facility] 10/16 lacunar causing L leg weakness and balance problems    Hx of renal insufficiency - goes to the renal doctor  bp was high there also so put her on hydralazine in June  She goes back next month  Due for labs   Hx of hyperlipidemia Lab Results  Component Value Date   CHOL  156 03/02/2015   HDL 34.60 (L) 03/02/2015   LDLCALC 97 03/02/2015   LDLDIRECT 172.8 01/14/2013   TRIG 119.0 03/02/2015   CHOLHDL 5 03/02/2015   On 5 mg crestor daily    Patient Active Problem List   Diagnosis Date Noted  . Osteopenia 07/26/2015  . Estrogen deficiency 06/23/2015  . CVA (cerebral vascular accident) ([place]) 03/02/2015  . Encounter for Medicare annual wellness exam 09/23/2013  . Spinal stenosis of lumbar region 09/23/2013  . Pain in left hip 07/23/2012  . GERD (gastroesophageal reflux disease) 12/12/2011  . Renal insufficiency 11/26/2008  . ALLERGIC RHINITIS 09/10/2007  . Hyperlipidemia 09/08/2006  . Essential hypertension 09/08/2006  . POSTNASAL DRIP SYNDROME 09/08/2006  . DIVERTICULITIS, HX OF 09/08/2006   Past Medical History:  Diagnosis Date  . Abnormal blood findings    baseline cr 1.3, watching for renal insufficiency  . Allergic rhinitis   . History of diverticulitis of colon   . Hyperglycemia 09   mild  . Hyperlipidemia   . Hypertension   . Stroke (cerebrum) [facility])    Past Surgical History:  Procedure Laterality Date  . ABDOMINAL  HYSTERECTOMY    . APPENDECTOMY     Patient states not sure if this is true  . barium swallow  10/18/03  . CARDIOVASCULAR STRESS TEST  07/21/04  . CYSTECTOMY  93   R axilla  . CYSTECTOMY  97   R breast   Social History  Substance Use Topics  . Smoking status: Former Smoker    Quit date: 04/25/1978  . Smokeless tobacco: Never Used  . Alcohol use No   Family History  Problem Relation Age of Onset  . Arthritis Mother   . Arthritis Sister   . Lupus Sister     ?  . Diabetes Sister   . Prostate cancer Brother   . Stroke Father   . Hypertension Father     ?   Allergies  Allergen Reactions  . Amlodipine Swelling    Throat and mouth swelling  . Fluticasone Propionate     REACTION: does not help  . Loratadine     REACTION: does not work  . Lovastatin     REACTION: was not effective  . Omeprazole Swelling     Throat, lips, and mouth swelling  . Rosuvastatin     REACTION: GI side eff  . Zocor [Simvastatin - High Dose]     Mouth and throat swelled   Current Outpatient Prescriptions on File Prior to Visit  Medication Sig Dispense Refill  . aspirin 81 MG tablet Take 81 mg by mouth daily.    . Calcium Carbonate-Vit D-Min (CALCIUM 1200 PO) Take by mouth.    Marland Kitchen ketotifen (ZADITOR) 0.025 % ophthalmic solution Apply to eye.    Marland Kitchen lisinopril (PRINIVIL,ZESTRIL) 10 MG tablet TAKE 1 TABLET DAILY 90 tablet 1  . nortriptyline (PAMELOR) 10 MG capsule Take 1 capsule (10 mg total) by mouth at bedtime. 30 capsule 3  . TOPROL XL 50 MG 24 hr tablet TAKE 1 TABLET DAILY WITH OR IMMEDIATELY FOLLOWING A MEAL 90 tablet 0   No current facility-administered medications on file prior to visit.     Review of Systems Review of Systems  Constitutional: Negative for fever, appetite change, fatigue and unexpected weight change.  Eyes: Negative for pain and visual disturbance.  ENT pos for hearing loss  Respiratory: Negative for cough and shortness of breath.   Cardiovascular: Negative for cp or palpitations    Gastrointestinal: Negative for nausea, diarrhea and constipation.  Genitourinary: Negative for urgency and frequency.  Skin: Negative for pallor or rash   Neurological: Negative for , light-headedness, numbness and headaches. pos for residual L sided symptoms from CVA Hematological: Negative for adenopathy. Does not bruise/bleed easily.  Psychiatric/Behavioral: Negative for dysphoric mood. The patient is sometimes nervous/anxious.         Objective:   Physical Exam  Constitutional: She appears well-developed and well-nourished. No distress.  overwt and well appearing   HENT:  Head: Normocephalic and atraumatic.  Right Ear: External ear normal.  Left Ear: External ear normal.  Mouth/Throat: Oropharynx is clear and moist.  Eyes: Conjunctivae and EOM are normal. Pupils are equal, round, and reactive to light. No  scleral icterus.  Neck: Normal range of motion. Neck supple. No JVD present. Carotid bruit is not present. No thyromegaly present.  Cardiovascular: Normal rate, regular rhythm, normal heart sounds and intact distal pulses.  Exam reveals no gallop.   Pulmonary/Chest: Effort normal and breath sounds normal. No respiratory distress. She has no wheezes. She exhibits no tenderness.  Abdominal: Soft. Bowel sounds are normal. She exhibits no  distension, no abdominal bruit and no mass. There is no tenderness.  Genitourinary: No breast swelling, tenderness, discharge or bleeding.  Genitourinary Comments: Breast exam: No mass, nodules, thickening, tenderness, bulging, retraction, inflamation, nipple discharge or skin changes noted.  No axillary or clavicular LA.      Musculoskeletal: Normal range of motion. She exhibits no edema or tenderness.  Lymphadenopathy:    She has no cervical adenopathy.  Neurological: She is alert. She has normal reflexes. No cranial nerve deficit. She exhibits normal muscle tone. Coordination normal.  No change from baseline Nl gait  Skin: Skin is warm and dry. No rash noted. No erythema. No pallor.  Psychiatric: She has a normal mood and affect.  Seems mildly anxious           Assessment & Plan:   Problem List Items Addressed This Visit      Cardiovascular and Mediastinum   CVA (cerebral vascular accident) ([place])    Overall L sided symptoms have gradually improved No new strokes bp is not optimal in the office  She continues to do her PT exercises        Relevant Medications   hydrALAZINE (APRESOLINE) 25 MG tablet   rosuvastatin (CRESTOR) 20 MG tablet   Essential hypertension - Primary    BP: (!) 148/85    Not at goal  For renal f/u soon  Hydralazine was added last time Disc DASH eating plan  Lab today      Relevant Medications   hydrALAZINE (APRESOLINE) 25 MG tablet   rosuvastatin (CRESTOR) 20 MG tablet   Other Relevant Orders   CBC with  Differential/Platelet (Completed)   Comprehensive metabolic panel (Completed)   Lipid panel (Completed)   TSH (Completed)     Musculoskeletal and Integument   Osteopenia    Rev dexa Disc need for calcium/ vitamin D/ wt bearing exercise and bone density test every 2 y to monitor Disc safety/ fracture risk in detail          Genitourinary   Renal insufficiency    For renal f/u  bp is not optimal  Disc imp of fluids- aim for 64 oz daily         Other   Hyperlipidemia    Disc goals for lipids and reasons to control them Rev labs with pt  (last check) Rev low sat fat diet in detail  Lab today On low dose crestor       Relevant Medications   hydrALAZINE (APRESOLINE) 25 MG tablet   rosuvastatin (CRESTOR) 20 MG tablet   Other Relevant Orders   Lipid panel (Completed)

## 2016-06-28 NOTE — Patient Instructions (Signed)
Martha Ortega , Thank you for taking time to come for your Medicare Wellness Visit. I appreciate your ongoing commitment to your health goals. Please review the following plan we discussed and let me know if I can assist you in the future.   These are the goals we discussed: Goals    . Increase physical activity          Starting 06/28/16, I will continue to exercise at least 30 min 4-5 days per week.        This is a list of the screening recommended for you and due dates:  Health Maintenance  Topic Date Due  . Mammogram  06/06/2017  . Tetanus Vaccine  11/11/2018  . Flu Shot  Addressed  . DEXA scan (bone density measurement)  Completed  . Pneumonia vaccines  Completed   Preventive Care for Adults  A healthy lifestyle and preventive care can promote health and wellness. Preventive health guidelines for adults include the following key practices.  . A routine yearly physical is a good way to check with your health care provider about your health and preventive screening. It is a chance to share any concerns and updates on your health and to receive a thorough exam.  . Visit your dentist for a routine exam and preventive care every 6 months. Brush your teeth twice a day and floss once a day. Good oral hygiene prevents tooth decay and gum disease.  . The frequency of eye exams is based on your age, health, family medical history, use  of contact lenses, and other factors. Follow your health care provider's ecommendations for frequency of eye exams.  . Eat a healthy diet. Foods like vegetables, fruits, whole grains, low-fat dairy products, and lean protein foods contain the nutrients you need without too many calories. Decrease your intake of foods high in solid fats, added sugars, and salt. Eat the right amount of calories for you. Get information about a proper diet from your health care provider, if necessary.  . Regular physical exercise is one of the most important things you can do  for your health. Most adults should get at least 150 minutes of moderate-intensity exercise (any activity that increases your heart rate and causes you to sweat) each week. In addition, most adults need muscle-strengthening exercises on 2 or more days a week.  Silver Sneakers may be a benefit available to you. To determine eligibility, you may visit the website: www.silversneakers.com or contact program at 612-197-9508 Mon-Fri between 8AM-8PM.   . Maintain a healthy weight. The body mass index (BMI) is a screening tool to identify possible weight problems. It provides an estimate of body fat based on height and weight. Your health care provider can find your BMI and can help you achieve or maintain a healthy weight.   For adults 20 years and older: ? A BMI below 18.5 is considered underweight. ? A BMI of 18.5 to 24.9 is normal. ? A BMI of 25 to 29.9 is considered overweight. ? A BMI of 30 and above is considered obese.   . Maintain normal blood lipids and cholesterol levels by exercising and minimizing your intake of saturated fat. Eat a balanced diet with plenty of fruit and vegetables. Blood tests for lipids and cholesterol should begin at age 7 and be repeated every 5 years. If your lipid or cholesterol levels are high, you are over 50, or you are at high risk for heart disease, you may need your cholesterol levels checked more  frequently. Ongoing high lipid and cholesterol levels should be treated with medicines if diet and exercise are not working.  . If you smoke, find out from your health care provider how to quit. If you do not use tobacco, please do not start.  . If you choose to drink alcohol, please do not consume more than 2 drinks per day. One drink is considered to be 12 ounces (355 mL) of beer, 5 ounces (148 mL) of wine, or 1.5 ounces (44 mL) of liquor.  . If you are 49-16 years old, ask your health care provider if you should take aspirin to prevent strokes.  . Use sunscreen.  Apply sunscreen liberally and repeatedly throughout the day. You should seek shade when your shadow is shorter than you. Protect yourself by wearing long sleeves, pants, a wide-brimmed hat, and sunglasses year round, whenever you are outdoors.  . Once a month, do a whole body skin exam, using a mirror to look at the skin on your back. Tell your health care provider of new moles, moles that have irregular borders, moles that are larger than a pencil eraser, or moles that have changed in shape or color.

## 2016-06-28 NOTE — Patient Instructions (Addendum)
Make sure you get at least 64 oz of fluids per day for kidney health  Labs today  Blood pressure is a bit high but it improved after sitting   Eat a healthy diet  Stay active and keep exercising  Stay social -that is good for your memory

## 2016-06-28 NOTE — Progress Notes (Signed)
Subjective:   Martha Ortega is a 82 y.o. female who presents for Medicare Annual (Subsequent) preventive examination.  Review of Systems:  N/A Cardiac Risk Factors include: advanced age (>9men, >57 women);dyslipidemia;hypertension     Objective:     Vitals: BP (!) 160/88 (BP Location: Right Arm, Patient Position: Sitting, Cuff Size: Normal) Comment: 170/98 in left arm  Pulse 72   Temp 98.5 F (36.9 C) (Oral)   Ht 5\' 2"  (1.575 m) Comment: no shoes  Wt 153 lb 4 oz (69.5 kg)   SpO2 97%   BMI 28.03 kg/m   Body mass index is 28.03 kg/m.   Tobacco History  Smoking Status  . Former Smoker  . Quit date: 04/25/1978  Smokeless Tobacco  . Never Used     Counseling given: No   Past Medical History:  Diagnosis Date  . Abnormal blood findings    baseline cr 1.3, watching for renal insufficiency  . Allergic rhinitis   . History of diverticulitis of colon   . Hyperglycemia 09   mild  . Hyperlipidemia   . Hypertension   . Stroke (cerebrum) [facility])    Past Surgical History:  Procedure Laterality Date  . ABDOMINAL HYSTERECTOMY    . APPENDECTOMY     Patient states not sure if this is true  . barium swallow  10/18/03  . CARDIOVASCULAR STRESS TEST  07/21/04  . CYSTECTOMY  93   R axilla  . CYSTECTOMY  97   R breast   Family History  Problem Relation Age of Onset  . Arthritis Mother   . Arthritis Sister   . Lupus Sister     ?  . Diabetes Sister   . Prostate cancer Brother   . Stroke Father   . Hypertension Father     ?   History  Sexual Activity  . Sexual activity: Not on file    Outpatient Encounter Prescriptions as of 06/28/2016  Medication Sig  . aspirin 81 MG tablet Take 81 mg by mouth daily.  . Calcium Carbonate-Vit D-Min (CALCIUM 1200 PO) Take by mouth.  . DULoxetine (CYMBALTA) 30 MG capsule Take 1 capsule (30 mg total) by mouth daily.  Marland Kitchen ketotifen (ZADITOR) 0.025 % ophthalmic solution Apply to eye.  Marland Kitchen lisinopril (PRINIVIL,ZESTRIL) 10 MG tablet TAKE 1  TABLET DAILY  . nortriptyline (PAMELOR) 10 MG capsule Take 1 capsule (10 mg total) by mouth at bedtime.  . pregabalin (LYRICA) 75 MG capsule Take 1 capsule (75 mg total) by mouth 2 (two) times daily.  . TOPROL XL 50 MG 24 hr tablet TAKE 1 TABLET DAILY WITH OR IMMEDIATELY FOLLOWING A MEAL  . rosuvastatin (CRESTOR) 5 MG tablet Take 1 tablet by mouth daily.  . [DISCONTINUED] rosuvastatin (CRESTOR) 20 MG tablet Take 20 mg by mouth daily.   No facility-administered encounter medications on file as of 06/28/2016.     Activities of Daily Living In your present state of health, do you have any difficulty performing the following activities: 06/28/2016  Hearing? N  Vision? N  Difficulty concentrating or making decisions? N  Walking or climbing stairs? Y  Dressing or bathing? N  Doing errands, shopping? N  Preparing Food and eating ? N  Using the Toilet? N  In the past six months, have you accidently leaked urine? N  Do you have problems with loss of bowel control? N  Managing your Medications? N  Managing your Finances? N  Housekeeping or managing your Housekeeping? N  Some recent data  might be hidden    Patient Care Team: [name], MD as PCP - General    Assessment:     Hearing Screening   125Hz  250Hz  500Hz  1000Hz  2000Hz  3000Hz  4000Hz  6000Hz  8000Hz   Right ear:   0 0 40  40    Left ear:   0 0 40  40    Vision Screening Comments: Last vision exam in 2016. Future appt scheduled 07/04/16   Exercise Activities and Dietary recommendations Current Exercise Habits: Home exercise routine, Type of exercise: walking;Other - see comments (stationary pedal bike), Time (Minutes): 30, Frequency (Times/Week): 5, Weekly Exercise (Minutes/Week): 150, Intensity: Mild, Exercise limited by: None identified  Goals    . Increase physical activity          Starting 06/28/16, I will continue to exercise at least 30 min 4-5 days per week.       Fall Risk Fall Risk  06/28/2016 04/20/2016 06/03/2015  09/23/2013  Falls in the past year? No No Yes No  Injury with Fall? - - No -  Risk for fall due to : - - History of fall(s);Impaired balance/gait -   Depression Screen PHQ 2/9 Scores 06/28/2016 06/03/2015 09/23/2013  PHQ - 2 Score 0 0 0     Cognitive Function MMSE - Mini Mental State Exam 06/28/2016 06/03/2015  Orientation to time 5 5  Orientation to Place 5 5  Registration 3 3  Attention/ Calculation 0 5  Recall 1 3  Recall-comments pt was unable to recall 2 of 3 words -  Language- name 2 objects 0 -  Language- repeat 1 1  Language- follow 3 step command 3 -  Language- read & follow direction 0 -  Write a sentence 0 -  Copy design 0 -  Total score 18 -       PLEASE NOTE: A Mini-Cog screen was completed. Maximum score is 20. A value of 0 denotes this part of Folstein MMSE was not completed or the patient failed this part of the Mini-Cog screening.   Mini-Cog Screening Orientation to Time - Max 5 pts Orientation to Place - Max 5 pts Registration - Max 3 pts Recall - Max 3 pts Language Repeat - Max 1 pts Language Follow 3 Step Command - Max 3 pts   Immunization History  Administered Date(s) Administered  . Influenza Split 01/23/2012  . Influenza Whole 01/23/2009, 02/23/2010  . Influenza, High Dose Seasonal PF 02/03/2014, 02/06/2015  . Influenza,inj,Quad PF,36+ Mos 01/21/2013  . Pneumococcal Conjugate-13 06/03/2015  . Pneumococcal Polysaccharide-23 04/26/2003  . Td 04/26/1995, 11/10/2008   Screening Tests Health Maintenance  Topic Date Due  . MAMMOGRAM  06/06/2017  . TETANUS/TDAP  11/11/2018  . INFLUENZA VACCINE  Addressed  . DEXA SCAN  Completed  . PNA vac Low Risk Adult  Completed      Plan:    I have personally reviewed and addressed the Medicare Annual Wellness questionnaire and have noted the following in the patient's chart:  A. Medical and social history B. Use of alcohol, tobacco or illicit drugs  C. Current medications and supplements D. Functional ability and  status E.  Nutritional status F.  Physical activity G. Advance directives H. List of other physicians I.  Hospitalizations, surgeries, and ER visits in previous 12 months J.  [place] to include hearing, vision, cognitive, depression L. Referrals and appointments - none  In addition, I have reviewed and discussed with patient certain preventive protocols, quality metrics, and best practice recommendations.  A written personalized care plan for preventive services as well as general preventive health recommendations were provided to patient.  See attached scanned questionnaire for additional information.   Signed,   Lindell Noe, MHA, BS, LPN Health Coach  During the course of the visit the patient was educated and counseled about the following appropriate screening and preventive services:   Vaccines to include Pneumoccal, Influenza, Hepatitis B, Td, Zostavax, HCV  Electrocardiogram  Cardiovascular Disease  Colorectal cancer screening  Bone density screening  Diabetes screening  Glaucoma screening  Mammography/PAP  Nutrition counseling   Patient Instructions (the written plan) was given to the patient.   Lindell Noe, LPN  12/[id number]/[id number]

## 2016-06-28 NOTE — Progress Notes (Signed)
Pre visit review using our clinic review tool, if applicable. No additional management support is needed unless otherwise documented below in the visit note. 

## 2016-06-30 NOTE — Assessment & Plan Note (Signed)
BP: (!) 148/85    Not at goal  For renal f/u soon  Hydralazine was added last time Disc DASH eating plan  Lab today

## 2016-06-30 NOTE — Assessment & Plan Note (Signed)
For renal f/u  bp is not optimal  Disc imp of fluids- aim for 64 oz daily

## 2016-06-30 NOTE — Assessment & Plan Note (Addendum)
Disc goals for lipids and reasons to control them Rev labs with pt  (last check) Rev low sat fat diet in detail  Lab today On low dose crestor

## 2016-06-30 NOTE — Assessment & Plan Note (Signed)
Rev dexa Disc need for calcium/ vitamin D/ wt bearing exercise and bone density test every 2 y to monitor Disc safety/ fracture risk in detail

## 2016-06-30 NOTE — Assessment & Plan Note (Signed)
Overall L sided symptoms have gradually improved No new strokes bp is not optimal in the office  She continues to do her PT exercises

## 2016-07-18 ENCOUNTER — Emergency Department (HOSPITAL_COMMUNITY)
Admission: EM | Admit: 2016-07-18 | Discharge: 2016-07-18 | Disposition: A | Discharge status: Home / Self Care | Payer: Medicare Other | Attending: Emergency Medicine | Admitting: Emergency Medicine

## 2016-07-18 ENCOUNTER — Encounter (HOSPITAL_COMMUNITY): Payer: Self-pay

## 2016-07-18 DIAGNOSIS — Z87891 Personal history of nicotine dependence: Secondary | ICD-10-CM | POA: Insufficient documentation

## 2016-07-18 DIAGNOSIS — Z8673 Personal history of transient ischemic attack (TIA), and cerebral infarction without residual deficits: Secondary | ICD-10-CM | POA: Insufficient documentation

## 2016-07-18 DIAGNOSIS — Z79899 Other long term (current) drug therapy: Secondary | ICD-10-CM | POA: Diagnosis not present

## 2016-07-18 DIAGNOSIS — R319 Hematuria, unspecified: Secondary | ICD-10-CM | POA: Diagnosis not present

## 2016-07-18 DIAGNOSIS — Z7982 Long term (current) use of aspirin: Secondary | ICD-10-CM | POA: Diagnosis not present

## 2016-07-18 LAB — CBC
HCT: 35.3 % — ABNORMAL LOW (ref 36.0–46.0)
Hemoglobin: 11.6 g/dL — ABNORMAL LOW (ref 12.0–15.0)
MCH: 26.6 pg (ref 26.0–34.0)
MCHC: 32.9 g/dL (ref 30.0–36.0)
MCV: 81 fL (ref 78.0–100.0)
PLATELETS: 199 10*3/uL (ref 150–400)
RBC: 4.36 MIL/uL (ref 3.87–5.11)
RDW: 15.2 % (ref 11.5–15.5)
WBC: 5.1 10*3/uL (ref 4.0–10.5)

## 2016-07-18 LAB — URINALYSIS, ROUTINE W REFLEX MICROSCOPIC
Bilirubin Urine: NEGATIVE
GLUCOSE, UA: NEGATIVE mg/dL
Ketones, ur: NEGATIVE mg/dL
Nitrite: NEGATIVE
Protein, ur: 100 mg/dL — AB
SPECIFIC GRAVITY, URINE: 1.006 (ref 1.005–1.030)
SQUAMOUS EPITHELIAL / LPF: NONE SEEN
pH: 6 (ref 5.0–8.0)

## 2016-07-18 LAB — BASIC METABOLIC PANEL
Anion gap: 9 (ref 5–15)
BUN: 13 mg/dL (ref 6–20)
CHLORIDE: 107 mmol/L (ref 101–111)
CO2: 25 mmol/L (ref 22–32)
CREATININE: 1.72 mg/dL — AB (ref 0.44–1.00)
Calcium: 8.9 mg/dL (ref 8.9–10.3)
GFR calc Af Amer: 31 mL/min — ABNORMAL LOW (ref >60)
GFR, EST NON AFRICAN AMERICAN: 27 mL/min — AB (ref >60)
Glucose, Bld: 120 mg/dL — ABNORMAL HIGH (ref 65–99)
Potassium: 3.9 mmol/L (ref 3.5–5.1)
SODIUM: 141 mmol/L (ref 135–145)

## 2016-07-18 LAB — PROTIME-INR
INR: 1.27
PROTHROMBIN TIME: 15.9 s — AB (ref 11.4–15.2)

## 2016-07-18 MED ORDER — LISINOPRIL 10 MG PO TABS
10.0000 mg | ORAL_TABLET | Freq: Every day | ORAL | Status: DC
  Administered 2016-07-18: 10 mg via ORAL
  Filled 2016-07-18 (×2): qty 1

## 2016-07-18 MED ORDER — HYDRALAZINE HCL 25 MG PO TABS
25.0000 mg | ORAL_TABLET | Freq: Two times a day (BID) | ORAL | Status: DC
  Administered 2016-07-18: 25 mg via ORAL
  Filled 2016-07-18 (×2): qty 1

## 2016-07-18 MED ORDER — METOPROLOL SUCCINATE ER 50 MG PO TB24
50.0000 mg | ORAL_TABLET | Freq: Once | ORAL | Status: AC
  Administered 2016-07-18: 50 mg via ORAL
  Filled 2016-07-18: qty 1

## 2016-07-18 MED ORDER — CEPHALEXIN 250 MG PO CAPS
500.0000 mg | ORAL_CAPSULE | Freq: Once | ORAL | Status: AC
  Administered 2016-07-18: 500 mg via ORAL
  Filled 2016-07-18: qty 2

## 2016-07-18 MED ORDER — CEPHALEXIN 250 MG PO CAPS: 250.0000 mg | ORAL_CAPSULE | Freq: Four times a day (QID) | ORAL | 0 refills | Status: DC

## 2016-07-18 NOTE — Discharge Instructions (Signed)
Please take all antibiotics as prescribed. Please call [facility] for appointment as soon as possible. Return if you appear to have more bleeding especially blood clots or increased frequency of bloody urine.

## 2016-07-18 NOTE — ED Triage Notes (Signed)
Patient complains of hematuria since yesterday am. Denies abdominal pain, denies dysuria. NAD

## 2016-07-18 NOTE — ED Provider Notes (Signed)
[facility] DEPT Provider Note   CSN: [id number] Arrival date & time: 07/18/16  3536     History   Chief Complaint Chief Complaint  Patient presents with  . Hematuria    HPI Martha Ortega is a 82 y.o. female.  HPI  82 year old lady history of hypertension, stroke presents today complaining of hematuria that began yesterday. She states she notes blood in the commode when she urinates. She has noted this every time she has urinated yesterday and today which consists of a total of about 8 times. She has not had any increased frequency of urination, pain with urination, flank pain, back pain, or abdominal pain. She has not had any similar episodes in the past. She takes an aspirin daily but no other blood thinners. She has not had any similar symptoms in the past. She states that she does note a bruise on the medial aspect of the left ankle and foot without any associated injury. She has not noted any other bruising or bleeding. She has high blood pressure and normally takes her 3 blood pressure medicines in the morning but has not had them today. She denies any headache, vision change, or mediastinal stroke symptoms. She has had a hysterectomy and denies rectal or vaginal bleeding.  Past Medical History:  Diagnosis Date  . Abnormal blood findings    baseline cr 1.3, watching for renal insufficiency  . Allergic rhinitis   . History of diverticulitis of colon   . Hyperglycemia 09   mild  . Hyperlipidemia   . Hypertension   . Stroke (cerebrum) [facility])     Patient Active Problem List   Diagnosis Date Noted  . Osteopenia 07/26/2015  . Estrogen deficiency 06/23/2015  . CVA (cerebral vascular accident) ([place]) 03/02/2015  . Encounter for Medicare annual wellness exam 09/23/2013  . Spinal stenosis of lumbar region 09/23/2013  . Pain in left hip 07/23/2012  . GERD (gastroesophageal reflux disease) 12/12/2011  . Renal insufficiency 11/26/2008  . ALLERGIC RHINITIS 09/10/2007  .  Hyperlipidemia 09/08/2006  . Essential hypertension 09/08/2006  . POSTNASAL DRIP SYNDROME 09/08/2006  . DIVERTICULITIS, HX OF 09/08/2006    Past Surgical History:  Procedure Laterality Date  . ABDOMINAL HYSTERECTOMY    . APPENDECTOMY     Patient states not sure if this is true  . barium swallow  10/18/03  . CARDIOVASCULAR STRESS TEST  07/21/04  . CYSTECTOMY  93   R axilla  . CYSTECTOMY  97   R breast    OB History    No data available       Home Medications    Prior to Admission medications   Medication Sig Start Date End Date Taking? Authorizing Provider  aspirin 81 MG tablet Take 81 mg by mouth daily.    Historical Provider, MD  Calcium Carbonate-Vit D-Min (CALCIUM 1200 PO) Take by mouth.    Historical Provider, MD  hydrALAZINE (APRESOLINE) 25 MG tablet Take 25 mg by mouth 2 (two) times daily.    Historical Provider, MD  ketotifen (ZADITOR) 0.025 % ophthalmic solution Apply to eye.    Historical Provider, MD  lisinopril (PRINIVIL,ZESTRIL) 10 MG tablet TAKE 1 TABLET DAILY 06/13/16   [name], MD  nortriptyline (PAMELOR) 10 MG capsule Take 1 capsule (10 mg total) by mouth at bedtime. 06/14/16   Pieter Partridge, DO  rosuvastatin (CRESTOR) 20 MG tablet Take 20 mg by mouth daily.    Historical Provider, MD  TOPROL XL 50 MG 24 hr tablet  TAKE 1 TABLET DAILY WITH OR IMMEDIATELY FOLLOWING A MEAL 04/28/16   [name], MD    Family History Family History  Problem Relation Age of Onset  . Arthritis Mother   . Arthritis Sister   . Lupus Sister     ?  . Diabetes Sister   . Prostate cancer Brother   . Stroke Father   . Hypertension Father     ?    Social History Social History  Substance Use Topics  . Smoking status: Former Smoker    Quit date: 04/25/1978  . Smokeless tobacco: Never Used  . Alcohol use No     Allergies   Amlodipine; Fluticasone propionate; Loratadine; Lovastatin; Omeprazole; Rosuvastatin; and Zocor [simvastatin - high dose]   Review of  Systems Review of Systems  All other systems reviewed and are negative.    Physical Exam Updated Vital Signs BP (!) 190/112 (BP Location: Left Arm)   Pulse 93   Temp 98.3 F (36.8 C) (Oral)   Resp 18   Ht 5\' 2"  (1.575 m)   Wt 69.9 kg   SpO2 100%   BMI 28.17 kg/m   Physical Exam  Constitutional: She is oriented to person, place, and time. She appears well-developed and well-nourished. No distress.  HENT:  Head: Normocephalic and atraumatic.  Right Ear: External ear normal.  Left Ear: External ear normal.  Nose: Nose normal.  Eyes: Conjunctivae and EOM are normal. Pupils are equal, round, and reactive to light.  Neck: Normal range of motion. Neck supple.  Cardiovascular: Normal rate, regular rhythm and normal heart sounds.   Pulmonary/Chest: Effort normal.  Abdominal: Soft. Bowel sounds are normal. She exhibits no distension. There is no tenderness. There is no guarding.  Genitourinary:  Genitourinary Comments: External genitalia exam reveals no signs of blood, prolapse, or masses  Musculoskeletal: Normal range of motion.  Neurological: She is alert and oriented to person, place, and time. She exhibits normal muscle tone. Coordination normal.  Skin: Skin is warm and dry. Capillary refill takes less than 2 seconds.  Psychiatric: She has a normal mood and affect. Her behavior is normal. Judgment and thought content normal.  Nursing note and vitals reviewed.    ED Treatments / Results  Labs (all labs ordered are listed, but only abnormal results are displayed) Labs Reviewed  URINALYSIS, ROUTINE W REFLEX MICROSCOPIC - Abnormal; Notable for the following:       Result Value   Color, Urine RED (*)    APPearance HAZY (*)    Hgb urine dipstick LARGE (*)    Protein, ur 100 (*)    Leukocytes, UA TRACE (*)    Bacteria, UA MANY (*)    All other components within normal limits  CBC - Abnormal; Notable for the following:    Hemoglobin 11.6 (*)    HCT 35.3 (*)    All other  components within normal limits  BASIC METABOLIC PANEL - Abnormal; Notable for the following:    Glucose, Bld 120 (*)    Creatinine, Ser 1.72 (*)    GFR calc non Af Amer 27 (*)    GFR calc Af Amer 31 (*)    All other components within normal limits  PROTIME-INR - Abnormal; Notable for the following:    Prothrombin Time 15.9 (*)    All other components within normal limits  URINE CULTURE    EKG  EKG Interpretation None       Radiology No results found.  Procedures Procedures (including  critical care time)  Medications Ordered in ED Medications - No data to display   Initial Impression / Assessment and Plan / ED Course  I have reviewed the triage vital signs and the nursing notes.  Pertinent labs & imaging results that were available during my care of the patient were reviewed by me and considered in my medical decision making (see chart for details).     This is a 82 year old female who presents today with several days of hematuria. There is no report of clot. Her hemoglobin is 11.9 which is decreased from 13.1 on March 6 but is within laboratory error. She is hypertensive and is not tachycardic. I feel that she is hemodynamically stable. She is on aspirin. Her platelets are 199,000. She has no other evidence of bleeding. She has not have any urine while here. Plan culture of urine and placed on Keflex. She is advised regarding follow-up with urology as soon as possible. She is advised of return precautions.  Final Clinical Impressions(s) / ED Diagnoses   Final diagnoses:  Hematuria, unspecified type    New Prescriptions New Prescriptions   CEPHALEXIN (KEFLEX) 250 MG CAPSULE    Take 1 capsule (250 mg total) by mouth 4 (four) times daily.     Pattricia Boss, MD 07/18/16 1056

## 2016-07-18 NOTE — ED Notes (Signed)
ED Provider at bedside. 

## 2016-07-18 NOTE — ED Notes (Signed)
Pt states that she has not had her BP medications today.

## 2016-07-19 LAB — URINE CULTURE

## 2016-07-22 ENCOUNTER — Encounter: Payer: Self-pay | Admitting: Family Medicine

## 2016-07-27 ENCOUNTER — Other Ambulatory Visit: Payer: Self-pay | Admitting: Family Medicine

## 2016-08-02 ENCOUNTER — Ambulatory Visit: Payer: Medicare Other | Admitting: Urology

## 2016-08-02 DIAGNOSIS — R351 Nocturia: Secondary | ICD-10-CM | POA: Diagnosis not present

## 2016-08-02 DIAGNOSIS — R310 Gross hematuria: Secondary | ICD-10-CM | POA: Diagnosis not present

## 2016-08-04 DIAGNOSIS — N184 Chronic kidney disease, stage 4 (severe): Secondary | ICD-10-CM | POA: Diagnosis not present

## 2016-08-04 DIAGNOSIS — R319 Hematuria, unspecified: Secondary | ICD-10-CM | POA: Diagnosis not present

## 2016-08-04 DIAGNOSIS — I129 Hypertensive chronic kidney disease with stage 1 through stage 4 chronic kidney disease, or unspecified chronic kidney disease: Secondary | ICD-10-CM | POA: Diagnosis not present

## 2016-08-04 DIAGNOSIS — N2581 Secondary hyperparathyroidism of renal origin: Secondary | ICD-10-CM | POA: Diagnosis not present

## 2016-08-04 DIAGNOSIS — N183 Chronic kidney disease, stage 3 (moderate): Secondary | ICD-10-CM | POA: Diagnosis not present

## 2016-08-04 DIAGNOSIS — E669 Obesity, unspecified: Secondary | ICD-10-CM | POA: Diagnosis not present

## 2016-08-04 DIAGNOSIS — E785 Hyperlipidemia, unspecified: Secondary | ICD-10-CM | POA: Diagnosis not present

## 2016-08-10 DIAGNOSIS — H2513 Age-related nuclear cataract, bilateral: Secondary | ICD-10-CM | POA: Diagnosis not present

## 2016-08-10 DIAGNOSIS — H524 Presbyopia: Secondary | ICD-10-CM | POA: Diagnosis not present

## 2016-08-16 ENCOUNTER — Encounter: Payer: Self-pay | Admitting: Orthopaedic Surgery

## 2016-08-16 DIAGNOSIS — R319 Hematuria, unspecified: Secondary | ICD-10-CM | POA: Diagnosis not present

## 2016-08-16 DIAGNOSIS — R310 Gross hematuria: Secondary | ICD-10-CM | POA: Diagnosis not present

## 2016-08-23 DIAGNOSIS — R351 Nocturia: Secondary | ICD-10-CM | POA: Diagnosis not present

## 2016-08-23 DIAGNOSIS — R310 Gross hematuria: Secondary | ICD-10-CM | POA: Diagnosis not present

## 2016-09-20 ENCOUNTER — Encounter: Payer: Self-pay | Admitting: Family Medicine

## 2016-09-21 MED ORDER — ROSUVASTATIN CALCIUM 20 MG PO TABS: 20.0000 mg | ORAL_TABLET | Freq: Every day | ORAL | 3 refills | Status: DC

## 2016-10-20 DIAGNOSIS — N184 Chronic kidney disease, stage 4 (severe): Secondary | ICD-10-CM | POA: Diagnosis not present

## 2016-10-20 DIAGNOSIS — E669 Obesity, unspecified: Secondary | ICD-10-CM | POA: Diagnosis not present

## 2016-10-20 DIAGNOSIS — N2581 Secondary hyperparathyroidism of renal origin: Secondary | ICD-10-CM | POA: Diagnosis not present

## 2016-10-20 DIAGNOSIS — I129 Hypertensive chronic kidney disease with stage 1 through stage 4 chronic kidney disease, or unspecified chronic kidney disease: Secondary | ICD-10-CM | POA: Diagnosis not present

## 2016-10-20 DIAGNOSIS — R319 Hematuria, unspecified: Secondary | ICD-10-CM | POA: Diagnosis not present

## 2016-10-20 DIAGNOSIS — E785 Hyperlipidemia, unspecified: Secondary | ICD-10-CM | POA: Diagnosis not present

## 2016-12-10 ENCOUNTER — Other Ambulatory Visit: Payer: Self-pay | Admitting: Family Medicine

## 2017-02-22 DIAGNOSIS — E669 Obesity, unspecified: Secondary | ICD-10-CM | POA: Diagnosis not present

## 2017-02-22 DIAGNOSIS — E785 Hyperlipidemia, unspecified: Secondary | ICD-10-CM | POA: Diagnosis not present

## 2017-02-22 DIAGNOSIS — N184 Chronic kidney disease, stage 4 (severe): Secondary | ICD-10-CM | POA: Diagnosis not present

## 2017-02-22 DIAGNOSIS — N2581 Secondary hyperparathyroidism of renal origin: Secondary | ICD-10-CM | POA: Diagnosis not present

## 2017-02-22 DIAGNOSIS — I129 Hypertensive chronic kidney disease with stage 1 through stage 4 chronic kidney disease, or unspecified chronic kidney disease: Secondary | ICD-10-CM | POA: Diagnosis not present

## 2017-02-22 DIAGNOSIS — Z23 Encounter for immunization: Secondary | ICD-10-CM | POA: Diagnosis not present

## 2017-04-23 ENCOUNTER — Other Ambulatory Visit: Payer: Self-pay | Admitting: Family Medicine

## 2017-05-08 ENCOUNTER — Other Ambulatory Visit: Payer: Self-pay | Admitting: Family Medicine

## 2017-05-08 DIAGNOSIS — Z1231 Encounter for screening mammogram for malignant neoplasm of breast: Secondary | ICD-10-CM

## 2017-06-07 ENCOUNTER — Ambulatory Visit
Admission: RE | Admit: 2017-06-07 | Discharge: 2017-06-07 | Disposition: A | Discharge status: Home / Self Care | Payer: Medicare Other | Source: Ambulatory Visit | Attending: Family Medicine | Admitting: Family Medicine

## 2017-06-07 DIAGNOSIS — Z1231 Encounter for screening mammogram for malignant neoplasm of breast: Secondary | ICD-10-CM | POA: Diagnosis not present

## 2017-06-22 DIAGNOSIS — E785 Hyperlipidemia, unspecified: Secondary | ICD-10-CM | POA: Diagnosis not present

## 2017-06-22 DIAGNOSIS — D631 Anemia in chronic kidney disease: Secondary | ICD-10-CM | POA: Diagnosis not present

## 2017-06-22 DIAGNOSIS — N2581 Secondary hyperparathyroidism of renal origin: Secondary | ICD-10-CM | POA: Diagnosis not present

## 2017-06-22 DIAGNOSIS — E669 Obesity, unspecified: Secondary | ICD-10-CM | POA: Diagnosis not present

## 2017-06-22 DIAGNOSIS — I129 Hypertensive chronic kidney disease with stage 1 through stage 4 chronic kidney disease, or unspecified chronic kidney disease: Secondary | ICD-10-CM | POA: Diagnosis not present

## 2017-06-22 DIAGNOSIS — N184 Chronic kidney disease, stage 4 (severe): Secondary | ICD-10-CM | POA: Diagnosis not present

## 2017-06-22 DIAGNOSIS — N189 Chronic kidney disease, unspecified: Secondary | ICD-10-CM | POA: Diagnosis not present

## 2017-07-22 ENCOUNTER — Other Ambulatory Visit: Payer: Self-pay | Admitting: Family Medicine

## 2017-07-24 NOTE — Telephone Encounter (Signed)
Med refilled and Morey [facility] will call to schedule f/u

## 2017-07-24 NOTE — Telephone Encounter (Signed)
Please schedule a late spring/early summer f/u and refill until then

## 2017-07-24 NOTE — Telephone Encounter (Signed)
Pt hasn't been seen in over a year and no future appts., please advise  

## 2017-09-11 ENCOUNTER — Ambulatory Visit: Payer: Medicare Other | Admitting: Family Medicine

## 2017-09-25 ENCOUNTER — Other Ambulatory Visit: Payer: Self-pay | Admitting: Family Medicine

## 2017-10-01 ENCOUNTER — Telehealth: Payer: Self-pay | Admitting: Family Medicine

## 2017-10-01 DIAGNOSIS — N289 Disorder of kidney and ureter, unspecified: Secondary | ICD-10-CM

## 2017-10-01 DIAGNOSIS — E7800 Pure hypercholesterolemia, unspecified: Secondary | ICD-10-CM

## 2017-10-01 DIAGNOSIS — I10 Essential (primary) hypertension: Secondary | ICD-10-CM

## 2017-10-01 NOTE — Telephone Encounter (Signed)
-----   Message from Eustace Pen, LPN sent at 07/01/4663  3:59 PM EDT ----- Regarding: Labs 6/10 Lab orders needed. Thank you.  Insurance:  [employer]

## 2017-10-02 ENCOUNTER — Encounter: Payer: Self-pay | Admitting: Family Medicine

## 2017-10-02 ENCOUNTER — Ambulatory Visit (INDEPENDENT_AMBULATORY_CARE_PROVIDER_SITE_OTHER): Payer: Medicare Other | Admitting: Family Medicine

## 2017-10-02 ENCOUNTER — Ambulatory Visit (INDEPENDENT_AMBULATORY_CARE_PROVIDER_SITE_OTHER): Payer: Medicare Other

## 2017-10-02 VITALS — BP 150/80 | HR 63 | Temp 98.4°F | Ht 62.5 in | Wt 152.5 lb

## 2017-10-02 DIAGNOSIS — E7800 Pure hypercholesterolemia, unspecified: Secondary | ICD-10-CM

## 2017-10-02 DIAGNOSIS — M8589 Other specified disorders of bone density and structure, multiple sites: Secondary | ICD-10-CM | POA: Diagnosis not present

## 2017-10-02 DIAGNOSIS — I10 Essential (primary) hypertension: Secondary | ICD-10-CM | POA: Diagnosis not present

## 2017-10-02 DIAGNOSIS — I69354 Hemiplegia and hemiparesis following cerebral infarction affecting left non-dominant side: Secondary | ICD-10-CM

## 2017-10-02 DIAGNOSIS — N289 Disorder of kidney and ureter, unspecified: Secondary | ICD-10-CM

## 2017-10-02 DIAGNOSIS — Z0000 Encounter for general adult medical examination without abnormal findings: Secondary | ICD-10-CM

## 2017-10-02 DIAGNOSIS — I69393 Ataxia following cerebral infarction: Secondary | ICD-10-CM | POA: Diagnosis not present

## 2017-10-02 DIAGNOSIS — E2839 Other primary ovarian failure: Secondary | ICD-10-CM

## 2017-10-02 DIAGNOSIS — I63531 Cerebral infarction due to unspecified occlusion or stenosis of right posterior cerebral artery: Secondary | ICD-10-CM

## 2017-10-02 LAB — CBC WITH DIFFERENTIAL/PLATELET
BASOS PCT: 0.6 % (ref 0.0–3.0)
Basophils Absolute: 0 10*3/uL (ref 0.0–0.1)
EOS ABS: 0.4 10*3/uL (ref 0.0–0.7)
Eosinophils Relative: 7.4 % — ABNORMAL HIGH (ref 0.0–5.0)
HCT: 37.8 % (ref 36.0–46.0)
HEMOGLOBIN: 12.4 g/dL (ref 12.0–15.0)
LYMPHS ABS: 2.5 10*3/uL (ref 0.7–4.0)
Lymphocytes Relative: 44.4 % (ref 12.0–46.0)
MCHC: 32.9 g/dL (ref 30.0–36.0)
MCV: 84.4 fl (ref 78.0–100.0)
MONO ABS: 0.5 10*3/uL (ref 0.1–1.0)
Monocytes Relative: 8.5 % (ref 3.0–12.0)
NEUTROS PCT: 39.1 % — AB (ref 43.0–77.0)
Neutro Abs: 2.2 10*3/uL (ref 1.4–7.7)
Platelets: 203 10*3/uL (ref 150.0–400.0)
RBC: 4.48 Mil/uL (ref 3.87–5.11)
RDW: 16 % — ABNORMAL HIGH (ref 11.5–15.5)
WBC: 5.7 10*3/uL (ref 4.0–10.5)

## 2017-10-02 LAB — COMPREHENSIVE METABOLIC PANEL
ALT: 9 U/L (ref 0–35)
AST: 15 U/L (ref 0–37)
Albumin: 3.8 g/dL (ref 3.5–5.2)
Alkaline Phosphatase: 89 U/L (ref 39–117)
BUN: 18 mg/dL (ref 6–23)
CO2: 25 mEq/L (ref 19–32)
CREATININE: 1.8 mg/dL — AB (ref 0.40–1.20)
Calcium: 9 mg/dL (ref 8.4–10.5)
Chloride: 110 mEq/L (ref 96–112)
GFR: 34.77 mL/min — ABNORMAL LOW (ref >60.00)
GLUCOSE: 109 mg/dL — AB (ref 70–99)
Potassium: 4.3 mEq/L (ref 3.5–5.1)
SODIUM: 142 meq/L (ref 135–145)
TOTAL PROTEIN: 6.9 g/dL (ref 6.0–8.3)
Total Bilirubin: 0.4 mg/dL (ref 0.2–1.2)

## 2017-10-02 LAB — LIPID PANEL
CHOLESTEROL: 128 mg/dL (ref 0–200)
HDL: 32.7 mg/dL — ABNORMAL LOW (ref >39.00)
LDL Cholesterol: 71 mg/dL (ref 0–99)
NONHDL: 95.5
Total CHOL/HDL Ratio: 4
Triglycerides: 122 mg/dL (ref 0.0–149.0)
VLDL: 24.4 mg/dL (ref 0.0–40.0)

## 2017-10-02 LAB — TSH: TSH: 1.56 u[IU]/mL (ref 0.35–4.50)

## 2017-10-02 MED ORDER — ROSUVASTATIN CALCIUM 20 MG PO TABS: 20.0000 mg | ORAL_TABLET | Freq: Every day | ORAL | 3 refills | Status: DC

## 2017-10-02 MED ORDER — METOPROLOL SUCCINATE ER 50 MG PO TB24: ORAL_TABLET | ORAL | 3 refills | Status: DC

## 2017-10-02 NOTE — Assessment & Plan Note (Signed)
Ongoing L sided muscle/nerve discomfort  She plans to try gabapentin again which she has at home Disc poss side eff incl sedation/dizziness , will use caution

## 2017-10-02 NOTE — Patient Instructions (Addendum)
A daily walk would help blood pressure  Also avoiding processed food with a lot of sodium  Drink your water also   You are due for bone density test- we will set that up on the way out   Waiting on your lab results   Gabapentin helps nerve discomfort if you want to try it again (you have some at home)

## 2017-10-02 NOTE — Patient Instructions (Signed)
Martha Ortega , Thank you for taking time to come for your Medicare Wellness Visit. I appreciate your ongoing commitment to your health goals. Please review the following plan we discussed and let me know if I can assist you in the future.   These are the goals we discussed: Goals    . DIET - INCREASE WATER INTAKE     Starting 6/10/019, I will continue to        This is a list of the screening recommended for you and due dates:  Health Maintenance  Topic Date Due  . Flu Shot  11/23/2017  . Mammogram  06/07/2018  . Tetanus Vaccine  11/11/2018  . DEXA scan (bone density measurement)  Completed  . Pneumonia vaccines  Completed   Preventive Care for Adults  A healthy lifestyle and preventive care can promote health and wellness. Preventive health guidelines for adults include the following key practices.  . A routine yearly physical is a good way to check with your health care provider about your health and preventive screening. It is a chance to share any concerns and updates on your health and to receive a thorough exam.  . Visit your dentist for a routine exam and preventive care every 6 months. Brush your teeth twice a day and floss once a day. Good oral hygiene prevents tooth decay and gum disease.  . The frequency of eye exams is based on your age, health, family medical history, use  of contact lenses, and other factors. Follow your health care provider's recommendations for frequency of eye exams.  . Eat a healthy diet. Foods like vegetables, fruits, whole grains, low-fat dairy products, and lean protein foods contain the nutrients you need without too many calories. Decrease your intake of foods high in solid fats, added sugars, and salt. Eat the right amount of calories for you. Get information about a proper diet from your health care provider, if necessary.  . Regular physical exercise is one of the most important things you can do for your health. Most adults should get at  least 150 minutes of moderate-intensity exercise (any activity that increases your heart rate and causes you to sweat) each week. In addition, most adults need muscle-strengthening exercises on 2 or more days a week.  Silver Sneakers may be a benefit available to you. To determine eligibility, you may visit the website: www.silversneakers.com or contact program at 928-082-7835 Mon-Fri between 8AM-8PM.   . Maintain a healthy weight. The body mass index (BMI) is a screening tool to identify possible weight problems. It provides an estimate of body fat based on height and weight. Your health care provider can find your BMI and can help you achieve or maintain a healthy weight.   For adults 20 years and older: ? A BMI below 18.5 is considered underweight. ? A BMI of 18.5 to 24.9 is normal. ? A BMI of 25 to 29.9 is considered overweight. ? A BMI of 30 and above is considered obese.   . Maintain normal blood lipids and cholesterol levels by exercising and minimizing your intake of saturated fat. Eat a balanced diet with plenty of fruit and vegetables. Blood tests for lipids and cholesterol should begin at age 50 and be repeated every 5 years. If your lipid or cholesterol levels are high, you are over 50, or you are at high risk for heart disease, you may need your cholesterol levels checked more frequently. Ongoing high lipid and cholesterol levels should be treated with medicines  if diet and exercise are not working.  . If you smoke, find out from your health care provider how to quit. If you do not use tobacco, please do not start.  . If you choose to drink alcohol, please do not consume more than 2 drinks per day. One drink is considered to be 12 ounces (355 mL) of beer, 5 ounces (148 mL) of wine, or 1.5 ounces (44 mL) of liquor.  . If you are 37-50 years old, ask your health care provider if you should take aspirin to prevent strokes.  . Use sunscreen. Apply sunscreen liberally and repeatedly  throughout the day. You should seek shade when your shadow is shorter than you. Protect yourself by wearing long sleeves, pants, a wide-brimmed hat, and sunglasses year round, whenever you are outdoors.  . Once a month, do a whole body skin exam, using a mirror to look at the skin on your back. Tell your health care provider of new moles, moles that have irregular borders, moles that are larger than a pencil eraser, or moles that have changed in shape or color.

## 2017-10-02 NOTE — Progress Notes (Signed)
Subjective:    Patient ID: Martha Ortega, female    DOB: 05-May-1934, 83 y.o.   MRN: [id number]  HPI  Here for annual f/u of chronic health problems   Wt Readings from Last 3 Encounters:  10/02/17 152 lb 8 oz (69.2 kg)  10/02/17 152 lb 8 oz (69.2 kg)  07/18/16 154 lb (69.9 kg)  stable  Tries to exercise - leg exercises and abd exercises  Does walk also  27.45 kg/m   Had amw today  Missed some hearing tones bilat  Not aware of it-no problems   Due for labs - drawn today/pending results   Mammogram 2/19-nl  Self breast exam -no lumps   dexa 3/17 - osteopenia  No falls or fx On D and Ca Will schedule dexa for 2 y f/u  zostavax 3/04   bp is stable today  No cp or palpitations or headaches or edema  No side effects to medicines  BP Readings from Last 3 Encounters:  10/02/17 (!) 150/80  10/02/17 (!) 150/80  07/18/16 (!) 115/96    Has whitecoat readings  Tends to be better at home  On hydralazine Not taking lisinopril - (she had an uncomfortable feeling in her neck that was caused by it)  Feels better without it --- and her kidney doctor is aware  2/19 was last visit Has appt end of the month  Hydralazine is three times daily  Up and down at home -- one teens to 160s depending on time of day      CVA 2016 L leg weakness and balance problems  toprol xl  Pulse Readings from Last 3 Encounters:  10/02/17 63  10/02/17 63  07/18/16 80     Renal insuff Sees renal Lab Results  Component Value Date   CREATININE 1.72 (H) 07/18/2016   BUN 13 07/18/2016   NA 141 07/18/2016   K 3.9 07/18/2016   CL 107 07/18/2016   CO2 25 07/18/2016  pending lab today   Hyperlipidemia Lab Results  Component Value Date   CHOL 130 06/28/2016   HDL 36.00 (L) 06/28/2016   LDLCALC 67 06/28/2016   LDLDIRECT 172.8 01/14/2013   TRIG 132.0 06/28/2016   CHOLHDL 4 06/28/2016   Pending lab today  Low dose crestor -now 20 mg  Eats quite healthy   For nerve discomfort  -still has some gabapentin at home  L sided discomfort- body and foot (feel tight)  May try it again    Patient Active Problem List   Diagnosis Date Noted  . Osteopenia 07/26/2015  . Estrogen deficiency 06/23/2015  . CVA (cerebral vascular accident) ([place]) 03/02/2015  . Encounter for Medicare annual wellness exam 09/23/2013  . Spinal stenosis of lumbar region 09/23/2013  . Pain in left hip 07/23/2012  . GERD (gastroesophageal reflux disease) 12/12/2011  . Renal insufficiency 11/26/2008  . ALLERGIC RHINITIS 09/10/2007  . Hyperlipidemia 09/08/2006  . Essential hypertension 09/08/2006  . POSTNASAL DRIP SYNDROME 09/08/2006  . DIVERTICULITIS, HX OF 09/08/2006   Past Medical History:  Diagnosis Date  . Abnormal blood findings    baseline cr 1.3, watching for renal insufficiency  . Allergic rhinitis   . History of diverticulitis of colon   . Hyperglycemia 09   mild  . Hyperlipidemia   . Hypertension   . Stroke (cerebrum) [facility])    Past Surgical History:  Procedure Laterality Date  . ABDOMINAL HYSTERECTOMY    . APPENDECTOMY     Patient states not sure if this  is true  . barium swallow  10/18/03  . CARDIOVASCULAR STRESS TEST  07/21/04  . CYSTECTOMY  93   R axilla  . CYSTECTOMY  97   R breast   Social History   Tobacco Use  . Smoking status: Former Smoker    Last attempt to quit: 04/25/1978    Years since quitting: 39.4  . Smokeless tobacco: Never Used  Substance Use Topics  . Alcohol use: No    Alcohol/week: 0.0 oz  . Drug use: No   Family History  Problem Relation Age of Onset  . Arthritis Mother   . Arthritis Sister   . Lupus Sister        ?  . Diabetes Sister   . Prostate cancer Brother   . Stroke Father   . Hypertension Father        ?   Allergies  Allergen Reactions  . Amlodipine Swelling    Throat and mouth swelling  . Fluticasone Propionate     REACTION: does not help  . Loratadine     REACTION: does not work  . Lovastatin     REACTION: was not  effective  . Omeprazole Swelling    Throat, lips, and mouth swelling  . Rosuvastatin     REACTION: GI side eff  . Zocor [Simvastatin - High Dose]     Mouth and throat swelled   Current Outpatient Medications on File Prior to Visit  Medication Sig Dispense Refill  . aspirin 81 MG tablet Take 81 mg by mouth daily.    . Calcium Carbonate-Vit D-Min (CALCIUM 1200 PO) Take by mouth.    . hydrALAZINE (APRESOLINE) 25 MG tablet Take 25 mg by mouth 3 (three) times daily.     Marland Kitchen ketotifen (ZADITOR) 0.025 % ophthalmic solution Apply to eye.     No current facility-administered medications on file prior to visit.      Review of Systems  Constitutional: Negative for activity change, appetite change, fatigue, fever and unexpected weight change.  HENT: Negative for congestion, ear pain, rhinorrhea, sinus pressure and sore throat.   Eyes: Negative for pain, redness and visual disturbance.  Respiratory: Negative for cough, shortness of breath and wheezing.   Cardiovascular: Negative for chest pain and palpitations.  Gastrointestinal: Negative for abdominal pain, blood in stool, constipation and diarrhea.  Endocrine: Negative for polydipsia and polyuria.  Genitourinary: Negative for dysuria, frequency and urgency.  Musculoskeletal: Negative for arthralgias, back pain and myalgias.  Skin: Negative for pallor and rash.  Allergic/Immunologic: Negative for environmental allergies.  Neurological: Negative for dizziness, tremors, seizures, syncope, facial asymmetry, speech difficulty, weakness, light-headedness, numbness and headaches.       Ongoing tight /discomfort over L side of body since her cva   Hematological: Negative for adenopathy. Does not bruise/bleed easily.  Psychiatric/Behavioral: Negative for decreased concentration and dysphoric mood. The patient is not nervous/anxious.        Objective:   Physical Exam  Constitutional: She appears well-developed and well-nourished. No distress.    overwt and well app  HENT:  Head: Normocephalic and atraumatic.  Right Ear: External ear normal.  Left Ear: External ear normal.  Mouth/Throat: Oropharynx is clear and moist.  Eyes: Pupils are equal, round, and reactive to light. Conjunctivae and EOM are normal. No scleral icterus.  Neck: Normal range of motion. Neck supple. No JVD present. Carotid bruit is not present. No thyromegaly present.  Cardiovascular: Normal rate, regular rhythm, normal heart sounds and intact distal pulses. Exam reveals  no gallop.  Pulmonary/Chest: Effort normal and breath sounds normal. No respiratory distress. She has no wheezes. She exhibits no tenderness. No breast tenderness, discharge or bleeding.  Abdominal: Soft. Bowel sounds are normal. She exhibits no distension, no abdominal bruit and no mass. There is no tenderness.  Genitourinary: No breast tenderness, discharge or bleeding.  Genitourinary Comments: Breast exam: No mass, nodules, thickening, tenderness, bulging, retraction, inflamation, nipple discharge or skin changes noted.  No axillary or clavicular LA.      Musculoskeletal: Normal range of motion. She exhibits no edema or tenderness.  No kyphosis   Lymphadenopathy:    She has no cervical adenopathy.  Neurological: She is alert. She has normal reflexes. She displays normal reflexes. No cranial nerve deficit. She exhibits normal muscle tone. Coordination normal.  Skin: Skin is warm and dry. No rash noted. No erythema. No pallor.  Skin tags scattered  Psychiatric: She has a normal mood and affect.  pleasant          Assessment & Plan:   Problem List Items Addressed This Visit      Cardiovascular and Mediastinum   CVA (cerebral vascular accident) ([place])    Ongoing L sided muscle/nerve discomfort  She plans to try gabapentin again which she has at home Disc poss side eff incl sedation/dizziness , will use caution      Relevant Medications   rosuvastatin (CRESTOR) 20 MG tablet    metoprolol succinate (TOPROL XL) 50 MG 24 hr tablet   Essential hypertension - Primary    BP: (!) 150/80 lower at home Off the lisinopril due to side eff Tolerates hydralazine  For renal appt later this mo- managing her bp        Relevant Medications   rosuvastatin (CRESTOR) 20 MG tablet   metoprolol succinate (TOPROL XL) 50 MG 24 hr tablet     Musculoskeletal and Integument   Osteopenia    Due for 2 y dexa  No falls or fx  Disc need for calcium/ vitamin D/ wt bearing exercise and bone density test every 2 y to monitor Disc safety/ fracture risk in detail          Genitourinary   Renal insufficiency    For upcoming nephrology appt bp is high -she is holding lisinopril due to side eff Labs pending from today  Counseled on water intake        Other   Estrogen deficiency   Relevant Orders   DG Bone Density   Hyperlipidemia    Disc goals for lipids and reasons to control them Rev last labs with pt Rev low sat fat diet in detail  crestor 20 mg-tolerating  Lab today      Relevant Medications   rosuvastatin (CRESTOR) 20 MG tablet   metoprolol succinate (TOPROL XL) 50 MG 24 hr tablet

## 2017-10-02 NOTE — Assessment & Plan Note (Signed)
Disc goals for lipids and reasons to control them Rev last labs with pt Rev low sat fat diet in detail  crestor 20 mg-tolerating  Lab today

## 2017-10-02 NOTE — Assessment & Plan Note (Signed)
BP: (!) 150/80 lower at home Off the lisinopril due to side eff Tolerates hydralazine  For renal appt later this mo- managing her bp

## 2017-10-02 NOTE — Progress Notes (Signed)
PCP notes:   Health maintenance:  No gaps identified.  Abnormal screenings:   Hearing - failed  Hearing Screening   125Hz  250Hz  500Hz  1000Hz  2000Hz  3000Hz  4000Hz  6000Hz  8000Hz   Right ear:   0 0 40  40    Left ear:   0 40 40  40     Patient concerns:   None  Nurse concerns:  None  Next PCP appt:   10/02/17 @ 1045  I reviewed health advisor's note, was available for consultation, and agree with documentation and plan. Loura Pardon MD

## 2017-10-02 NOTE — Assessment & Plan Note (Signed)
Due for 2 y dexa  No falls or fx  Disc need for calcium/ vitamin D/ wt bearing exercise and bone density test every 2 y to monitor Disc safety/ fracture risk in detail

## 2017-10-02 NOTE — Progress Notes (Signed)
Subjective:   Martha Ortega is a 83 y.o. female who presents for Medicare Annual/Subsequent preventive examination.  Review of Systems:  N/A Cardiac Risk Factors include: advanced age (>83men, >57 women);dyslipidemia;hypertension     Objective:    Vitals: BP (!) 150/80 (BP Location: Right Arm, Patient Position: Sitting, Cuff Size: Normal)   Pulse 63   Temp 98.4 F (36.9 C) (Oral)   Ht 5' 2.5" (1.588 m) Comment: no shoes  Wt 152 lb 8 oz (69.2 kg)   SpO2 98%   BMI 27.45 kg/m   Body mass index is 27.45 kg/m.  Advanced Directives 10/02/2017 07/18/2016 06/28/2016 06/03/2015  Does Patient Have a Medical Advance Directive? No No No No  Would patient like information on creating a medical advance directive? No - Patient declined No - Patient declined - Yes - Scientist, clinical (histocompatibility and immunogenetics) given    Tobacco Social History   Tobacco Use  Smoking Status Former Smoker  . Last attempt to quit: 04/25/1978  . Years since quitting: 39.4  Smokeless Tobacco Never Used     Counseling given: No   Clinical Intake:  Pre-visit preparation completed: Yes  Pain : No/denies pain Pain Score: 0-No pain     Nutritional Status: BMI 25 -29 Overweight Nutritional Risks: None Diabetes: No  How often do you need to have someone help you when you read instructions, pamphlets, or other written materials from your doctor or pharmacy?: 1 - Never What is the last grade level you completed in school?: 12th grade   Interpreter Needed?: No  Comments: pt and son live toegther Information entered by :: LPinson, LPN  Past Medical History:  Diagnosis Date  . Abnormal blood findings    baseline cr 1.3, watching for renal insufficiency  . Allergic rhinitis   . History of diverticulitis of colon   . Hyperglycemia 09   mild  . Hyperlipidemia   . Hypertension   . Stroke (cerebrum) [facility])    Past Surgical History:  Procedure Laterality Date  . ABDOMINAL HYSTERECTOMY    . APPENDECTOMY     Patient states not  sure if this is true  . barium swallow  10/18/03  . CARDIOVASCULAR STRESS TEST  07/21/04  . CYSTECTOMY  93   R axilla  . CYSTECTOMY  97   R breast   Family History  Problem Relation Age of Onset  . Arthritis Mother   . Arthritis Sister   . Lupus Sister        ?  . Diabetes Sister   . Prostate cancer Brother   . Stroke Father   . Hypertension Father        ?   Social History   Socioeconomic History  . Marital status: Widowed    Spouse name: Not on file  . Number of children: Not on file  . Years of education: Not on file  . Highest education level: Not on file  Occupational History  . Occupation: unemployed  Social Needs  . Financial resource strain: Not on file  . Food insecurity:    Worry: Not on file    Inability: Not on file  . Transportation needs:    Medical: Not on file    Non-medical: Not on file  Tobacco Use  . Smoking status: Former Smoker    Last attempt to quit: 04/25/1978    Years since quitting: 39.4  . Smokeless tobacco: Never Used  Substance and Sexual Activity  . Alcohol use: No    Alcohol/week: 0.0  oz  . Drug use: No  . Sexual activity: Not on file  Lifestyle  . Physical activity:    Days per week: Not on file    Minutes per session: Not on file  . Stress: Not on file  Relationships  . Social connections:    Talks on phone: Not on file    Gets together: Not on file    Attends religious service: Not on file    Active member of club or organization: Not on file    Attends meetings of clubs or organizations: Not on file    Relationship status: Not on file  Other Topics Concern  . Not on file  Social History Narrative   Widowed, not working, son lives close by   Regular exercise: walking videos 3-4 times weekly             Outpatient Encounter Medications as of 10/02/2017  Medication Sig  . aspirin 81 MG tablet Take 81 mg by mouth daily.  . Calcium Carbonate-Vit D-Min (CALCIUM 1200 PO) Take by mouth.  . hydrALAZINE (APRESOLINE) 25 MG  tablet Take 25 mg by mouth 3 (three) times daily.   Marland Kitchen ketotifen (ZADITOR) 0.025 % ophthalmic solution Apply to eye.  . rosuvastatin (CRESTOR) 20 MG tablet TAKE 1 TABLET DAILY  . TOPROL XL 50 MG 24 hr tablet TAKE 1 TABLET DAILY WITH OR IMMEDIATELY FOLLOWING A MEAL  . lisinopril (PRINIVIL,ZESTRIL) 10 MG tablet TAKE 1 TABLET DAILY (Patient not taking: Reported on 10/02/2017)  . nortriptyline (PAMELOR) 10 MG capsule Take 1 capsule (10 mg total) by mouth at bedtime. (Patient not taking: Reported on 10/02/2017)  . [DISCONTINUED] cephALEXin (KEFLEX) 250 MG capsule Take 1 capsule (250 mg total) by mouth 4 (four) times daily.   No facility-administered encounter medications on file as of 10/02/2017.     Activities of Daily Living In your present state of health, do you have any difficulty performing the following activities: 10/02/2017  Hearing? N  Vision? N  Difficulty concentrating or making decisions? N  Walking or climbing stairs? N  Dressing or bathing? N  Doing errands, shopping? N  Preparing Food and eating ? N  Using the Toilet? N  In the past six months, have you accidently leaked urine? N  Do you have problems with loss of bowel control? N  Managing your Medications? N  Managing your Finances? N  Housekeeping or managing your Housekeeping? N  Some recent data might be hidden    Patient Care Team: Tower, Wynelle Fanny, MD as PCP - General   Assessment:   This is a routine wellness examination for [place].   Hearing Screening   125Hz  250Hz  500Hz  1000Hz  2000Hz  3000Hz  4000Hz  6000Hz  8000Hz   Right ear:   0 0 40  40    Left ear:   0 40 40  40    Vision Screening Comments: Vision exam in 2018    Exercise Activities and Dietary recommendations Current Exercise Habits: Home exercise routine, Type of exercise: calisthenics, Time (Minutes): 20, Frequency (Times/Week): 7, Weekly Exercise (Minutes/Week): 140, Intensity: Mild, Exercise limited by: None identified  Goals    . DIET - INCREASE  WATER INTAKE     Starting 6/10/019, I will continue to        Fall Risk Fall Risk  10/02/2017 06/28/2016 04/20/2016 06/03/2015 09/23/2013  Falls in the past year? No No No Yes No  Injury with Fall? - - - No -  Risk for fall due to : - - -  History of fall(s);Impaired balance/gait -   Depression Screen PHQ 2/9 Scores 10/02/2017 06/28/2016 06/03/2015 09/23/2013  PHQ - 2 Score 0 0 0 0  PHQ- 9 Score 0 - - -    Cognitive Function MMSE - Mini Mental State Exam 10/02/2017 06/28/2016 06/03/2015  Orientation to time 5 5 5   Orientation to Place 5 5 5   Registration 3 3 3   Attention/ Calculation 0 0 5  Recall 3 1 3   Recall-comments - pt was unable to recall 2 of 3 words -  Language- name 2 objects 0 0 -  Language- repeat 1 1 1   Language- follow 3 step command 3 3 -  Language- read & follow direction 0 0 -  Write a sentence 0 0 -  Copy design 0 0 -  Total score 20 18 -     PLEASE NOTE: A Mini-Cog screen was completed. Maximum score is 20. A value of 0 denotes this part of Folstein MMSE was not completed or the patient failed this part of the Mini-Cog screening.   Mini-Cog Screening Orientation to Time - Max 5 pts Orientation to Place - Max 5 pts Registration - Max 3 pts Recall - Max 3 pts Language Repeat - Max 1 pts Language Follow 3 Step Command - Max 3 pts     Immunization History  Administered Date(s) Administered  . Influenza Split 01/23/2012  . Influenza Whole 01/23/2009, 02/23/2010  . Influenza, High Dose Seasonal PF 02/03/2014, 02/06/2015, 02/22/2017  . Influenza,inj,Quad PF,6+ Mos 01/21/2013  . Pneumococcal Conjugate-13 06/03/2015  . Pneumococcal Polysaccharide-23 04/26/2003  . Td 04/26/1995, 11/10/2008  . Zoster 06/25/2002   Screening Tests Health Maintenance  Topic Date Due  . INFLUENZA VACCINE  11/23/2017  . MAMMOGRAM  06/07/2018  . TETANUS/TDAP  11/11/2018  . DEXA SCAN  Completed  . PNA vac Low Risk Adult  Completed   Plan:     I have personally reviewed, addressed, and  noted the following in the patient's chart:  A. Medical and social history B. Use of alcohol, tobacco or illicit drugs  C. Current medications and supplements D. Functional ability and status E.  Nutritional status F.  Physical activity G. Advance directives H. List of other physicians I.  Hospitalizations, surgeries, and ER visits in previous 12 months J.  [place] to include hearing, vision, cognitive, depression L. Referrals and appointments - none  In addition, I have reviewed and discussed with patient certain preventive protocols, quality metrics, and best practice recommendations. A written personalized care plan for preventive services as well as general preventive health recommendations were provided to patient.  See attached scanned questionnaire for additional information.   Signed,   Lindell Noe, MHA, BS, LPN Health Coach

## 2017-10-02 NOTE — Assessment & Plan Note (Signed)
For upcoming nephrology appt bp is high -she is holding lisinopril due to side eff Labs pending from today  Counseled on water intake

## 2017-10-20 DIAGNOSIS — E785 Hyperlipidemia, unspecified: Secondary | ICD-10-CM | POA: Diagnosis not present

## 2017-10-20 DIAGNOSIS — I129 Hypertensive chronic kidney disease with stage 1 through stage 4 chronic kidney disease, or unspecified chronic kidney disease: Secondary | ICD-10-CM | POA: Diagnosis not present

## 2017-10-20 DIAGNOSIS — N2581 Secondary hyperparathyroidism of renal origin: Secondary | ICD-10-CM | POA: Diagnosis not present

## 2017-10-20 DIAGNOSIS — D631 Anemia in chronic kidney disease: Secondary | ICD-10-CM | POA: Diagnosis not present

## 2017-10-20 DIAGNOSIS — N184 Chronic kidney disease, stage 4 (severe): Secondary | ICD-10-CM | POA: Diagnosis not present

## 2017-11-14 ENCOUNTER — Ambulatory Visit
Admission: RE | Admit: 2017-11-14 | Discharge: 2017-11-14 | Disposition: A | Discharge status: Home / Self Care | Payer: Medicare Other | Source: Ambulatory Visit | Attending: Family Medicine | Admitting: Family Medicine

## 2017-11-14 DIAGNOSIS — M8589 Other specified disorders of bone density and structure, multiple sites: Secondary | ICD-10-CM | POA: Diagnosis not present

## 2017-11-14 DIAGNOSIS — E2839 Other primary ovarian failure: Secondary | ICD-10-CM

## 2017-11-14 DIAGNOSIS — Z780 Asymptomatic menopausal state: Secondary | ICD-10-CM | POA: Diagnosis not present

## 2017-11-27 ENCOUNTER — Other Ambulatory Visit: Payer: Self-pay

## 2018-02-06 DIAGNOSIS — H2513 Age-related nuclear cataract, bilateral: Secondary | ICD-10-CM | POA: Diagnosis not present

## 2018-02-06 DIAGNOSIS — H524 Presbyopia: Secondary | ICD-10-CM | POA: Diagnosis not present

## 2018-02-20 DIAGNOSIS — N2581 Secondary hyperparathyroidism of renal origin: Secondary | ICD-10-CM | POA: Diagnosis not present

## 2018-02-20 DIAGNOSIS — N184 Chronic kidney disease, stage 4 (severe): Secondary | ICD-10-CM | POA: Diagnosis not present

## 2018-02-20 DIAGNOSIS — D631 Anemia in chronic kidney disease: Secondary | ICD-10-CM | POA: Diagnosis not present

## 2018-02-20 DIAGNOSIS — I129 Hypertensive chronic kidney disease with stage 1 through stage 4 chronic kidney disease, or unspecified chronic kidney disease: Secondary | ICD-10-CM | POA: Diagnosis not present

## 2018-02-20 DIAGNOSIS — Z23 Encounter for immunization: Secondary | ICD-10-CM | POA: Diagnosis not present

## 2018-02-20 DIAGNOSIS — E785 Hyperlipidemia, unspecified: Secondary | ICD-10-CM | POA: Diagnosis not present

## 2018-02-20 DIAGNOSIS — N189 Chronic kidney disease, unspecified: Secondary | ICD-10-CM | POA: Diagnosis not present

## 2018-02-28 DIAGNOSIS — N184 Chronic kidney disease, stage 4 (severe): Secondary | ICD-10-CM | POA: Diagnosis not present

## 2018-05-04 ENCOUNTER — Other Ambulatory Visit: Payer: Self-pay | Admitting: Family Medicine

## 2018-05-04 DIAGNOSIS — Z1231 Encounter for screening mammogram for malignant neoplasm of breast: Secondary | ICD-10-CM

## 2018-06-13 ENCOUNTER — Ambulatory Visit
Admission: RE | Admit: 2018-06-13 | Discharge: 2018-06-13 | Disposition: A | Discharge status: Home / Self Care | Payer: Medicare Other | Source: Ambulatory Visit | Attending: Family Medicine | Admitting: Family Medicine

## 2018-06-13 DIAGNOSIS — E785 Hyperlipidemia, unspecified: Secondary | ICD-10-CM | POA: Diagnosis not present

## 2018-06-13 DIAGNOSIS — N2581 Secondary hyperparathyroidism of renal origin: Secondary | ICD-10-CM | POA: Diagnosis not present

## 2018-06-13 DIAGNOSIS — Z1231 Encounter for screening mammogram for malignant neoplasm of breast: Secondary | ICD-10-CM | POA: Diagnosis not present

## 2018-06-13 DIAGNOSIS — N184 Chronic kidney disease, stage 4 (severe): Secondary | ICD-10-CM | POA: Diagnosis not present

## 2018-06-13 DIAGNOSIS — I129 Hypertensive chronic kidney disease with stage 1 through stage 4 chronic kidney disease, or unspecified chronic kidney disease: Secondary | ICD-10-CM | POA: Diagnosis not present

## 2018-06-13 DIAGNOSIS — N189 Chronic kidney disease, unspecified: Secondary | ICD-10-CM | POA: Diagnosis not present

## 2018-06-13 DIAGNOSIS — D631 Anemia in chronic kidney disease: Secondary | ICD-10-CM | POA: Diagnosis not present

## 2018-10-03 ENCOUNTER — Other Ambulatory Visit: Payer: Self-pay | Admitting: Family Medicine

## 2018-10-09 ENCOUNTER — Ambulatory Visit: Payer: Medicare Other

## 2018-10-09 ENCOUNTER — Encounter: Payer: Self-pay | Admitting: Family Medicine

## 2018-10-09 ENCOUNTER — Other Ambulatory Visit: Payer: Self-pay

## 2018-10-09 ENCOUNTER — Ambulatory Visit (INDEPENDENT_AMBULATORY_CARE_PROVIDER_SITE_OTHER): Payer: Medicare Other | Admitting: Family Medicine

## 2018-10-09 ENCOUNTER — Ambulatory Visit (INDEPENDENT_AMBULATORY_CARE_PROVIDER_SITE_OTHER): Payer: Medicare Other

## 2018-10-09 VITALS — BP 143/80 | HR 80 | Temp 97.8°F | Ht 62.0 in | Wt 142.0 lb

## 2018-10-09 DIAGNOSIS — Z0000 Encounter for general adult medical examination without abnormal findings: Secondary | ICD-10-CM | POA: Diagnosis not present

## 2018-10-09 DIAGNOSIS — I63531 Cerebral infarction due to unspecified occlusion or stenosis of right posterior cerebral artery: Secondary | ICD-10-CM

## 2018-10-09 DIAGNOSIS — I10 Essential (primary) hypertension: Secondary | ICD-10-CM

## 2018-10-09 DIAGNOSIS — M8589 Other specified disorders of bone density and structure, multiple sites: Secondary | ICD-10-CM | POA: Diagnosis not present

## 2018-10-09 DIAGNOSIS — N289 Disorder of kidney and ureter, unspecified: Secondary | ICD-10-CM | POA: Diagnosis not present

## 2018-10-09 DIAGNOSIS — M48061 Spinal stenosis, lumbar region without neurogenic claudication: Secondary | ICD-10-CM | POA: Diagnosis not present

## 2018-10-09 DIAGNOSIS — E7800 Pure hypercholesterolemia, unspecified: Secondary | ICD-10-CM

## 2018-10-09 LAB — CBC WITH DIFFERENTIAL/PLATELET
Basophils Absolute: 0 10*3/uL (ref 0.0–0.1)
Basophils Relative: 0.2 % (ref 0.0–3.0)
Eosinophils Absolute: 0.5 10*3/uL (ref 0.0–0.7)
Eosinophils Relative: 6 % — ABNORMAL HIGH (ref 0.0–5.0)
HCT: 38.5 % (ref 36.0–46.0)
Hemoglobin: 12.4 g/dL (ref 12.0–15.0)
Lymphocytes Relative: 42.1 % (ref 12.0–46.0)
Lymphs Abs: 3.2 10*3/uL (ref 0.7–4.0)
MCHC: 32.1 g/dL (ref 30.0–36.0)
MCV: 84.1 fl (ref 78.0–100.0)
Monocytes Absolute: 0.7 10*3/uL (ref 0.1–1.0)
Monocytes Relative: 9.8 % (ref 3.0–12.0)
Neutro Abs: 3.2 10*3/uL (ref 1.4–7.7)
Neutrophils Relative %: 41.9 % — ABNORMAL LOW (ref 43.0–77.0)
Platelets: 219 10*3/uL (ref 150.0–400.0)
RBC: 4.57 Mil/uL (ref 3.87–5.11)
RDW: 16.2 % — ABNORMAL HIGH (ref 11.5–15.5)
WBC: 7.5 10*3/uL (ref 4.0–10.5)

## 2018-10-09 LAB — COMPREHENSIVE METABOLIC PANEL
ALT: 7 U/L (ref 0–35)
AST: 12 U/L (ref 0–37)
Albumin: 3.9 g/dL (ref 3.5–5.2)
Alkaline Phosphatase: 49 U/L (ref 39–117)
BUN: 24 mg/dL — ABNORMAL HIGH (ref 6–23)
CO2: 27 mEq/L (ref 19–32)
Calcium: 9.3 mg/dL (ref 8.4–10.5)
Chloride: 104 mEq/L (ref 96–112)
Creatinine, Ser: 1.78 mg/dL — ABNORMAL HIGH (ref 0.40–1.20)
GFR: 33.06 mL/min — ABNORMAL LOW (ref >60.00)
Glucose, Bld: 84 mg/dL (ref 70–99)
Potassium: 3.8 mEq/L (ref 3.5–5.1)
Sodium: 139 mEq/L (ref 135–145)
Total Bilirubin: 0.4 mg/dL (ref 0.2–1.2)
Total Protein: 6.9 g/dL (ref 6.0–8.3)

## 2018-10-09 LAB — LIPID PANEL
Cholesterol: 124 mg/dL (ref 0–200)
HDL: 35.9 mg/dL — ABNORMAL LOW (ref >39.00)
LDL Cholesterol: 66 mg/dL (ref 0–99)
NonHDL: 88.55
Total CHOL/HDL Ratio: 3
Triglycerides: 112 mg/dL (ref 0.0–149.0)
VLDL: 22.4 mg/dL (ref 0.0–40.0)

## 2018-10-09 LAB — TSH: TSH: 2.7 u[IU]/mL (ref 0.35–4.50)

## 2018-10-09 MED ORDER — ROSUVASTATIN CALCIUM 20 MG PO TABS: 20.0000 mg | ORAL_TABLET | Freq: Every day | ORAL | 3 refills | Status: DC

## 2018-10-09 NOTE — Assessment & Plan Note (Signed)
bp in fair control at this time  BP Readings from Last 1 Encounters:  10/09/18 (!) 143/80   No changes needed Most recent labs reviewed  Disc lifstyle change with low sodium diet and exercise

## 2018-10-09 NOTE — Progress Notes (Signed)
Subjective:   Martha Ortega is a 84 y.o. female who presents for Medicare Annual (Subsequent) preventive examination.  Review of Systems:  N/A Cardiac Risk Factors include: advanced age (>54men, >62 women);dyslipidemia;hypertension     Objective:     Vitals: There were no vitals taken for this visit.  There is no height or weight on file to calculate BMI.  Advanced Directives 10/09/2018 10/02/2017 07/18/2016 06/28/2016 06/03/2015  Does Patient Have a Medical Advance Directive? No No No No No  Would patient like information on creating a medical advance directive? No - Patient declined No - Patient declined No - Patient declined - Yes - Scientist, clinical (histocompatibility and immunogenetics) given    Tobacco Social History   Tobacco Use  Smoking Status Former Smoker  . Quit date: 04/25/1978  . Years since quitting: 40.4  Smokeless Tobacco Never Used     Counseling given: No   Clinical Intake:  Pre-visit preparation completed: Yes  Pain Score: 8 (left leg)     Nutritional Status: BMI 25 -29 Overweight Nutritional Risks: None Diabetes: No  How often do you need to have someone help you when you read instructions, pamphlets, or other written materials from your doctor or pharmacy?: 1 - Never What is the last grade level you completed in school?: 12th grade  Interpreter Needed?: No  Comments: pt is a widowed/ son lives with patient Information entered by :: LPinson, RN  Past Medical History:  Diagnosis Date  . Abnormal blood findings    baseline cr 1.3, watching for renal insufficiency  . Allergic rhinitis   . History of diverticulitis of colon   . Hyperglycemia 09   mild  . Hyperlipidemia   . Hypertension   . Stroke (cerebrum) [facility])    Past Surgical History:  Procedure Laterality Date  . ABDOMINAL HYSTERECTOMY    . APPENDECTOMY     Patient states not sure if this is true  . barium swallow  10/18/03  . CARDIOVASCULAR STRESS TEST  07/21/04  . CYSTECTOMY  93   R axilla  . CYSTECTOMY  97   R breast   Family History  Problem Relation Age of Onset  . Arthritis Mother   . Arthritis Sister   . Lupus Sister        ?  . Diabetes Sister   . Prostate cancer Brother   . Stroke Father   . Hypertension Father        ?   Social History   Socioeconomic History  . Marital status: Widowed    Spouse name: Not on file  . Number of children: Not on file  . Years of education: Not on file  . Highest education level: Not on file  Occupational History  . Occupation: unemployed  Social Needs  . Financial resource strain: Not on file  . Food insecurity    Worry: Not on file    Inability: Not on file  . Transportation needs    Medical: Not on file    Non-medical: Not on file  Tobacco Use  . Smoking status: Former Smoker    Quit date: 04/25/1978    Years since quitting: 40.4  . Smokeless tobacco: Never Used  Substance and Sexual Activity  . Alcohol use: No    Alcohol/week: 0.0 standard drinks  . Drug use: No  . Sexual activity: Not Currently  Lifestyle  . Physical activity    Days per week: Not on file    Minutes per session: Not on file  .  Stress: Not on file  Relationships  . Social Herbalist on phone: Not on file    Gets together: Not on file    Attends religious service: Not on file    Active member of club or organization: Not on file    Attends meetings of clubs or organizations: Not on file    Relationship status: Not on file  Other Topics Concern  . Not on file  Social History Narrative   Widowed, not working, son lives close by   Regular exercise: walking videos 3-4 times weekly             Outpatient Encounter Medications as of 10/09/2018  Medication Sig  . aspirin 81 MG tablet Take 81 mg by mouth daily.  . Calcium Carbonate-Vit D-Min (CALCIUM 1200 PO) Take by mouth.  . hydrALAZINE (APRESOLINE) 25 MG tablet Take 25 mg by mouth 3 (three) times daily.   Marland Kitchen ketotifen (ZADITOR) 0.025 % ophthalmic solution Apply to eye.  . metoprolol succinate  (TOPROL XL) 50 MG 24 hr tablet TAKE 1 TABLET DAILY WITH OR IMMEDIATELY FOLLOWING A MEAL  . rosuvastatin (CRESTOR) 20 MG tablet Take 1 tablet (20 mg total) by mouth daily.   No facility-administered encounter medications on file as of 10/09/2018.     Activities of Daily Living In your present state of health, do you have any difficulty performing the following activities: 10/09/2018  Hearing? N  Vision? N  Difficulty concentrating or making decisions? N  Walking or climbing stairs? N  Dressing or bathing? N  Doing errands, shopping? N  Preparing Food and eating ? N  Using the Toilet? N  In the past six months, have you accidently leaked urine? N  Do you have problems with loss of bowel control? N  Managing your Medications? N  Managing your Finances? N  Housekeeping or managing your Housekeeping? N  Some recent data might be hidden    Patient Care Team: Tower, Wynelle Fanny, MD as PCP - General    Assessment:   This is a routine wellness examination for [name].   Hearing Screening   125Hz  250Hz  500Hz  1000Hz  2000Hz  3000Hz  4000Hz  6000Hz  8000Hz   Right ear:           Left ear:           Vision Screening Comments: Last vision exam in 2019   Exercise Activities and Dietary recommendations Current Exercise Habits: Home exercise routine, Type of exercise: stretching;Other - see comments(stationary pedal), Time (Minutes): 15, Frequency (Times/Week): 3, Weekly Exercise (Minutes/Week): 45, Intensity: Mild, Exercise limited by: None identified  Goals    . Patient Stated     Starting 10/09/18, I will continue to take medications as prescribed.        Fall Risk Fall Risk  10/09/2018 11/27/2017 10/02/2017 06/28/2016 04/20/2016  Falls in the past year? 0 No No No No  Comment - Emmi Telephone Survey: data to providers prior to load - - -  Injury with Fall? - - - - -  Risk for fall due to : - - - - -   Depression Screen PHQ 2/9 Scores 10/09/2018 10/02/2017 06/28/2016 06/03/2015  PHQ - 2 Score 0 0 0 0   PHQ- 9 Score 0 0 - -     Cognitive Function MMSE - Mini Mental State Exam 10/09/2018 10/02/2017 06/28/2016 06/03/2015  Orientation to time 5 5 5 5   Orientation to Place 5 5 5 5   Registration 3 3 3  3  Attention/ Calculation 0 0 0 5  Recall 3 3 1 3   Recall-comments - - pt was unable to recall 2 of 3 words -  Language- name 2 objects 0 0 0 -  Language- repeat 1 1 1 1   Language- follow 3 step command 0 3 3 -  Language- read & follow direction 0 0 0 -  Write a sentence 0 0 0 -  Copy design 0 0 0 -  Total score 17 20 18  -       PLEASE NOTE: A Mini-Cog screen was completed. Maximum score is 17. A value of 0 denotes this part of Folstein MMSE was not completed or the patient failed this part of the Mini-Cog screening.   Mini-Cog Screening Orientation to Time - Max 5 pts Orientation to Place - Max 5 pts Registration - Max 3 pts Recall - Max 3 pts Language Repeat - Max 1 pts    Immunization History  Administered Date(s) Administered  . Influenza Split 01/23/2012  . Influenza Whole 01/23/2009, 02/23/2010  . Influenza, High Dose Seasonal PF 02/03/2014, 02/06/2015, 02/22/2017  . Influenza,inj,Quad PF,6+ Mos 01/21/2013  . Pneumococcal Conjugate-13 06/03/2015  . Pneumococcal Polysaccharide-23 04/26/2003  . Td 04/26/1995, 11/10/2008  . Zoster 06/25/2002    Screening Tests Health Maintenance  Topic Date Due  . TETANUS/TDAP  11/11/2018  . INFLUENZA VACCINE  11/24/2018  . MAMMOGRAM  06/14/2019  . DEXA SCAN  Completed  . PNA vac Low Risk Adult  Completed      Plan:    I have personally reviewed, addressed, and noted the following in the patient's chart:  A. Medical and social history B. Use of alcohol, tobacco or illicit drugs  C. Current medications and supplements D. Functional ability and status E.  Nutritional status F.  Physical activity G. Advance directives H. List of other physicians I.  Hospitalizations, surgeries, and ER visits in previous 12 months J.  Vitals  (unless it is a telemedicine encounter) K. Screenings to include cognitive, depression, hearing, vision (NOTE: hearing and vision screenings not completed in telemedicine encounter) L. Referrals and appointments   In addition, I have reviewed and discussed with patient certain preventive protocols, quality metrics, and best practice recommendations. A written personalized care plan for preventive services and recommendations were provided to patient.  With patient's permission, we connected on 10/09/18 at  9:00 AM EDT. Interactive audio and video telecommunications were attempted with patient. This attempt was unsuccessful due to patient having technical difficulties OR patient did not have access to video capability.  Encounter was completed with audio only.  Two patient identifiers were used to ensure the encounter occurred with the correct person. Patient was in home and writer was in office.   Signed,   Lindell Noe, MHA, BS, RN Health Coach

## 2018-10-09 NOTE — Progress Notes (Signed)
Subjective:    Patient ID: Martha Ortega, female    DOB: 08-14-1934, 84 y.o.   MRN: [id number]  HPI  Here for annual f/u of chronic health problems   Wt Readings from Last 3 Encounters:  10/09/18 142 lb (64.4 kg)  10/02/17 152 lb 8 oz (69.2 kg)  10/02/17 152 lb 8 oz (69.2 kg)  wt is down 10 lb  She has been trying to loose weight  Eating less (less appetite as she gets over)  Exercise - she uses a stationary bike / keeps her strong also  25.97 kg/m   Staying in -pandemic   amw today  No gaps or concerns   Has acute on choronic pain in L leg -related to old cva (takes statin and baby asa) Has used gabapentin  Also L arm tightness- chronic  Does not keep her from doing anything Takes occ tylenol    Mammogram 2/20 -neg  Self breast exam -no lumps or changes   dexa 7/19 -worse osteopenia  No falls or fx Supplements - she takes ca with D regularly  Also some walking   bp is stable today  No cp or palpitations or headaches or edema  No side effects to medicines  BP Readings from Last 3 Encounters:  10/09/18 (!) 166/92  10/02/17 (!) 150/80  10/02/17 (!) 150/80    Sees renal for help with BP Taking toprol xl  Hydralazine 25 tid Intol of ace  Still sees Dr Lorrene Reid every 4 mo  Thinks she has appt w/in the next month or so   Needs labs today    Renal insufficiency Lab Results  Component Value Date   CREATININE 1.80 (H) 10/02/2017   BUN 18 10/02/2017   NA 142 10/02/2017   K 4.3 10/02/2017   CL 110 10/02/2017   CO2 25 10/02/2017  due for labs   Hyperlipidemia  Lab Results  Component Value Date   CHOL 128 10/02/2017   HDL 32.70 (L) 10/02/2017   LDLCALC 71 10/02/2017   LDLDIRECT 172.8 01/14/2013   TRIG 122.0 10/02/2017   CHOLHDL 4 10/02/2017   Still taking crestor Due for labs today  Diet is fairly good   Patient Active Problem List   Diagnosis Date Noted  . Osteopenia 07/26/2015  . Estrogen deficiency 06/23/2015  . CVA (cerebral vascular  accident) ([place]) 03/02/2015  . Encounter for Medicare annual wellness exam 09/23/2013  . Spinal stenosis of lumbar region 09/23/2013  . GERD (gastroesophageal reflux disease) 12/12/2011  . Renal insufficiency 11/26/2008  . ALLERGIC RHINITIS 09/10/2007  . Hyperlipidemia 09/08/2006  . Essential hypertension 09/08/2006  . POSTNASAL DRIP SYNDROME 09/08/2006  . DIVERTICULITIS, HX OF 09/08/2006   Past Medical History:  Diagnosis Date  . Abnormal blood findings    baseline cr 1.3, watching for renal insufficiency  . Allergic rhinitis   . History of diverticulitis of colon   . Hyperglycemia 09   mild  . Hyperlipidemia   . Hypertension   . Stroke (cerebrum) [facility])    Past Surgical History:  Procedure Laterality Date  . ABDOMINAL HYSTERECTOMY    . APPENDECTOMY     Patient states not sure if this is true  . barium swallow  10/18/03  . CARDIOVASCULAR STRESS TEST  07/21/04  . CYSTECTOMY  93   R axilla  . CYSTECTOMY  97   R breast   Social History   Tobacco Use  . Smoking status: Former Smoker    Quit date: 04/25/1978  Years since quitting: 40.4  . Smokeless tobacco: Never Used  Substance Use Topics  . Alcohol use: No    Alcohol/week: 0.0 standard drinks  . Drug use: No   Family History  Problem Relation Age of Onset  . Arthritis Mother   . Arthritis Sister   . Lupus Sister        ?  . Diabetes Sister   . Prostate cancer Brother   . Stroke Father   . Hypertension Father        ?   Allergies  Allergen Reactions  . Amlodipine Swelling    Throat and mouth swelling  . Fluticasone Propionate     REACTION: does not help  . Loratadine     REACTION: does not work  . Lovastatin     REACTION: was not effective  . Omeprazole Swelling    Throat, lips, and mouth swelling  . Rosuvastatin     REACTION: GI side eff  . Zocor [Simvastatin - High Dose]     Mouth and throat swelled   Current Outpatient Medications on File Prior to Visit  Medication Sig Dispense Refill  .  aspirin 81 MG tablet Take 81 mg by mouth daily.    . Calcium Carbonate-Vit D-Min (CALCIUM 1200 PO) Take by mouth.    . hydrALAZINE (APRESOLINE) 25 MG tablet Take 25 mg by mouth 3 (three) times daily.     Marland Kitchen ketotifen (ZADITOR) 0.025 % ophthalmic solution Apply to eye.    . metoprolol succinate (TOPROL XL) 50 MG 24 hr tablet TAKE 1 TABLET DAILY WITH OR IMMEDIATELY FOLLOWING A MEAL 90 tablet 1   No current facility-administered medications on file prior to visit.     Review of Systems  Constitutional: Negative for activity change, appetite change, fatigue, fever and unexpected weight change.  HENT: Negative for congestion, ear pain, rhinorrhea, sinus pressure and sore throat.   Eyes: Negative for pain, redness and visual disturbance.  Respiratory: Negative for cough, shortness of breath and wheezing.   Cardiovascular: Negative for chest pain and palpitations.  Gastrointestinal: Negative for abdominal pain, blood in stool, constipation and diarrhea.  Endocrine: Negative for polydipsia and polyuria.  Genitourinary: Negative for dysuria, frequency and urgency.  Musculoskeletal: Negative for arthralgias, back pain and myalgias.       Chronic L leg pain  Skin: Negative for pallor and rash.  Allergic/Immunologic: Negative for environmental allergies.  Neurological: Negative for dizziness, syncope, weakness, numbness and headaches.       Chronic tingling and discomfort of L arm and leg s/p CVA  Hematological: Negative for adenopathy. Does not bruise/bleed easily.  Psychiatric/Behavioral: Negative for decreased concentration and dysphoric mood. The patient is not nervous/anxious.        Objective:   Physical Exam Constitutional:      General: She is not in acute distress.    Appearance: Normal appearance. She is well-developed and normal weight. She is not ill-appearing.  HENT:     Head: Normocephalic and atraumatic.     Right Ear: Tympanic membrane, ear canal and external ear normal.      Left Ear: Tympanic membrane, ear canal and external ear normal.     Nose: Nose normal.     Mouth/Throat:     Mouth: Mucous membranes are moist.     Pharynx: Oropharynx is clear. No posterior oropharyngeal erythema.  Eyes:     General: No scleral icterus.    Conjunctiva/sclera: Conjunctivae normal.     Pupils: Pupils are equal, round,  and reactive to light.  Neck:     Musculoskeletal: Normal range of motion and neck supple.     Thyroid: No thyromegaly.     Vascular: No carotid bruit or JVD.  Cardiovascular:     Rate and Rhythm: Normal rate and regular rhythm.     Pulses: Normal pulses.     Heart sounds: Normal heart sounds. No gallop.   Pulmonary:     Effort: Pulmonary effort is normal. No respiratory distress.     Breath sounds: Normal breath sounds. No wheezing.  Chest:     Chest wall: No tenderness.  Abdominal:     General: Bowel sounds are normal. There is no distension or abdominal bruit.     Palpations: Abdomen is soft. There is no mass.     Tenderness: There is no abdominal tenderness.  Genitourinary:    Comments: Breast exam: No mass, nodules, thickening, tenderness, bulging, retraction, inflamation, nipple discharge or skin changes noted.  No axillary or clavicular LA.     Musculoskeletal: Normal range of motion.        General: No tenderness.  Lymphadenopathy:     Cervical: No cervical adenopathy.  Skin:    General: Skin is warm and dry.     Coloration: Skin is not pale.     Findings: No erythema or rash.     Comments: Several comedones on chest- expressed with gauze today  Neurological:     Mental Status: She is alert. Mental status is at baseline.     Cranial Nerves: No cranial nerve deficit.     Motor: No abnormal muscle tone.     Coordination: Coordination normal.     Gait: Gait normal.     Deep Tendon Reflexes: Reflexes are normal and symmetric. Reflexes normal.  Psychiatric:        Mood and Affect: Mood normal.        Cognition and Memory: Cognition  normal.     Comments: Pleasant and talkative             Assessment & Plan:   Problem List Items Addressed This Visit      Cardiovascular and Mediastinum   Essential hypertension - Primary    bp in fair control at this time  BP Readings from Last 1 Encounters:  10/09/18 (!) 143/80   No changes needed Most recent labs reviewed  Disc lifstyle change with low sodium diet and exercise        Relevant Medications   rosuvastatin (CRESTOR) 20 MG tablet   Other Relevant Orders   CBC with Differential/Platelet (Completed)   Comprehensive metabolic panel (Completed)   Lipid panel (Completed)   TSH (Completed)   CVA (cerebral vascular accident) ([place])    Pt continues to experience L sided arm/leg tingling and discomfort  It does not limit her activities but decreases quality of life  Continues statin/baby asa and HTN medication       Relevant Medications   rosuvastatin (CRESTOR) 20 MG tablet     Musculoskeletal and Integument   Osteopenia    dexa 7/19 -worse osteopenia  No falls or fractures Taking ca and D Walking as tolerated for exercise Disc need for calcium/ vitamin D/ wt bearing exercise and bone density test every 2 y to monitor Disc safety/ fracture risk in detail          Genitourinary   Renal insufficiency    Due for labs  Sees nephrology- last cr 1.8 range  No symptoms  Other   Hyperlipidemia    Disc goals for lipids and reasons to control them Rev last labs with pt Rev low sat fat diet in detail Continues crestor and tolerates it well  Labs today      Relevant Medications   rosuvastatin (CRESTOR) 20 MG tablet   Other Relevant Orders   Lipid panel (Completed)   Spinal stenosis of lumbar region    Unsure if this may also add to chronic L leg discomfort (along with cva)  Continues gabapentin

## 2018-10-09 NOTE — Assessment & Plan Note (Signed)
Unsure if this may also add to chronic L leg discomfort (along with cva)  Continues gabapentin

## 2018-10-09 NOTE — Assessment & Plan Note (Signed)
Pt continues to experience L sided arm/leg tingling and discomfort  It does not limit her activities but decreases quality of life  Continues statin/baby asa and HTN medication

## 2018-10-09 NOTE — Patient Instructions (Signed)
Martha Ortega , Thank you for taking time to come for your Medicare Wellness Visit. I appreciate your ongoing commitment to your health goals. Please review the following plan we discussed and let me know if I can assist you in the future.   These are the goals we discussed: Goals    . Patient Stated     Starting 10/09/18, I will continue to take medications as prescribed.        This is a list of the screening recommended for you and due dates:  Health Maintenance  Topic Date Due  . Tetanus Vaccine  11/11/2018  . Flu Shot  11/24/2018  . Mammogram  06/14/2019  . DEXA scan (bone density measurement)  Completed  . Pneumonia vaccines  Completed   Preventive Care for Adults  A healthy lifestyle and preventive care can promote health and wellness. Preventive health guidelines for adults include the following key practices.  . A routine yearly physical is a good way to check with your health care provider about your health and preventive screening. It is a chance to share any concerns and updates on your health and to receive a thorough exam.  . Visit your dentist for a routine exam and preventive care every 6 months. Brush your teeth twice a day and floss once a day. Good oral hygiene prevents tooth decay and gum disease.  . The frequency of eye exams is based on your age, health, family medical history, use  of contact lenses, and other factors. Follow your health care provider's recommendations for frequency of eye exams.  . Eat a healthy diet. Foods like vegetables, fruits, whole grains, low-fat dairy products, and lean protein foods contain the nutrients you need without too many calories. Decrease your intake of foods high in solid fats, added sugars, and salt. Eat the right amount of calories for you. Get information about a proper diet from your health care provider, if necessary.  . Regular physical exercise is one of the most important things you can do for your health. Most adults  should get at least 150 minutes of moderate-intensity exercise (any activity that increases your heart rate and causes you to sweat) each week. In addition, most adults need muscle-strengthening exercises on 2 or more days a week.  Silver Sneakers may be a benefit available to you. To determine eligibility, you may visit the website: www.silversneakers.com or contact program at (514)094-0839 Mon-Fri between 8AM-8PM.   . Maintain a healthy weight. The body mass index (BMI) is a screening tool to identify possible weight problems. It provides an estimate of body fat based on height and weight. Your health care provider can find your BMI and can help you achieve or maintain a healthy weight.   For adults 20 years and older: ? A BMI below 18.5 is considered underweight. ? A BMI of 18.5 to 24.9 is normal. ? A BMI of 25 to 29.9 is considered overweight. ? A BMI of 30 and above is considered obese.   . Maintain normal blood lipids and cholesterol levels by exercising and minimizing your intake of saturated fat. Eat a balanced diet with plenty of fruit and vegetables. Blood tests for lipids and cholesterol should begin at age 63 and be repeated every 5 years. If your lipid or cholesterol levels are high, you are over 50, or you are at high risk for heart disease, you may need your cholesterol levels checked more frequently. Ongoing high lipid and cholesterol levels should be treated with  medicines if diet and exercise are not working.  . If you smoke, find out from your health care provider how to quit. If you do not use tobacco, please do not start.  . If you choose to drink alcohol, please do not consume more than 2 drinks per day. One drink is considered to be 12 ounces (355 mL) of beer, 5 ounces (148 mL) of wine, or 1.5 ounces (44 mL) of liquor.  . If you are 24-5 years old, ask your health care provider if you should take aspirin to prevent strokes.  . Use sunscreen. Apply sunscreen liberally and  repeatedly throughout the day. You should seek shade when your shadow is shorter than you. Protect yourself by wearing long sleeves, pants, a wide-brimmed hat, and sunglasses year round, whenever you are outdoors.  . Once a month, do a whole body skin exam, using a mirror to look at the skin on your back. Tell your health care provider of new moles, moles that have irregular borders, moles that are larger than a pencil eraser, or moles that have changed in shape or color.

## 2018-10-09 NOTE — Patient Instructions (Signed)
Stay active  Stay safe and out of crowds   Take care  No change in medicines  Follow up with the kidney doctor when you are due   Labs today   Continue calcium with D for your bones

## 2018-10-09 NOTE — Assessment & Plan Note (Signed)
dexa 7/19 -worse osteopenia  No falls or fractures Taking ca and D Walking as tolerated for exercise Disc need for calcium/ vitamin D/ wt bearing exercise and bone density test every 2 y to monitor Disc safety/ fracture risk in detail

## 2018-10-09 NOTE — Assessment & Plan Note (Signed)
Disc goals for lipids and reasons to control them Rev last labs with pt Rev low sat fat diet in detail Continues crestor and tolerates it well  Labs today

## 2018-10-09 NOTE — Assessment & Plan Note (Signed)
Due for labs  Sees nephrology- last cr 1.8 range  No symptoms

## 2018-10-09 NOTE — Progress Notes (Signed)
PCP notes:   Health maintenance:  No gaps identified.  Abnormal screenings:   None  Patient concerns:   Pain in left leg - has progressively worsen over last 7 days; pain scale: 8/10; manages pain with Tylenol  Nurse concerns:  None  Next PCP appt:   10/09/18 @ 1045  I reviewed health advisor's note, was available for consultation, and agree with documentation and plan. Loura Pardon MD

## 2018-10-17 DIAGNOSIS — I129 Hypertensive chronic kidney disease with stage 1 through stage 4 chronic kidney disease, or unspecified chronic kidney disease: Secondary | ICD-10-CM | POA: Diagnosis not present

## 2018-10-17 DIAGNOSIS — E785 Hyperlipidemia, unspecified: Secondary | ICD-10-CM | POA: Diagnosis not present

## 2018-10-17 DIAGNOSIS — D631 Anemia in chronic kidney disease: Secondary | ICD-10-CM | POA: Diagnosis not present

## 2018-10-17 DIAGNOSIS — N2581 Secondary hyperparathyroidism of renal origin: Secondary | ICD-10-CM | POA: Diagnosis not present

## 2018-10-17 DIAGNOSIS — N184 Chronic kidney disease, stage 4 (severe): Secondary | ICD-10-CM | POA: Diagnosis not present

## 2018-10-28 ENCOUNTER — Other Ambulatory Visit: Payer: Self-pay | Admitting: Family Medicine

## 2018-12-06 ENCOUNTER — Telehealth: Payer: Self-pay

## 2018-12-06 MED ORDER — ROSUVASTATIN CALCIUM 20 MG PO TABS: 20.0000 mg | ORAL_TABLET | Freq: Every day | ORAL | 3 refills | Status: DC

## 2018-12-06 NOTE — Telephone Encounter (Signed)
Med list updated

## 2018-12-06 NOTE — Telephone Encounter (Signed)
She is now tolerating crestor-please remove from allergy list  Send px if needed  Thanks

## 2018-12-06 NOTE — Telephone Encounter (Signed)
[facility] has [facility] and was told does not have refill for crestor. Was sent 10/09/18 by Dr Glori Bickers electronically. Will res[facility] for [facility]; pt is appreciative. FYI to Dr Glori Bickers to verify OK for pt to be on crestor due to crestor listed on allergy and adverse reactions for pt. Please advise.

## 2019-02-11 DIAGNOSIS — H2513 Age-related nuclear cataract, bilateral: Secondary | ICD-10-CM | POA: Diagnosis not present

## 2019-02-11 DIAGNOSIS — H25013 Cortical age-related cataract, bilateral: Secondary | ICD-10-CM | POA: Diagnosis not present

## 2019-02-11 DIAGNOSIS — H524 Presbyopia: Secondary | ICD-10-CM | POA: Diagnosis not present

## 2019-02-11 DIAGNOSIS — H40023 Open angle with borderline findings, high risk, bilateral: Secondary | ICD-10-CM | POA: Diagnosis not present

## 2019-02-19 DIAGNOSIS — D631 Anemia in chronic kidney disease: Secondary | ICD-10-CM | POA: Diagnosis not present

## 2019-02-19 DIAGNOSIS — N184 Chronic kidney disease, stage 4 (severe): Secondary | ICD-10-CM | POA: Diagnosis not present

## 2019-02-19 DIAGNOSIS — I129 Hypertensive chronic kidney disease with stage 1 through stage 4 chronic kidney disease, or unspecified chronic kidney disease: Secondary | ICD-10-CM | POA: Diagnosis not present

## 2019-02-19 DIAGNOSIS — N189 Chronic kidney disease, unspecified: Secondary | ICD-10-CM | POA: Diagnosis not present

## 2019-02-19 DIAGNOSIS — N2581 Secondary hyperparathyroidism of renal origin: Secondary | ICD-10-CM | POA: Diagnosis not present

## 2019-02-19 DIAGNOSIS — E785 Hyperlipidemia, unspecified: Secondary | ICD-10-CM | POA: Diagnosis not present

## 2019-02-20 DIAGNOSIS — Z23 Encounter for immunization: Secondary | ICD-10-CM | POA: Diagnosis not present

## 2019-04-01 ENCOUNTER — Other Ambulatory Visit: Payer: Self-pay | Admitting: Family Medicine

## 2019-05-02 ENCOUNTER — Other Ambulatory Visit: Payer: Self-pay | Admitting: Family Medicine

## 2019-05-02 DIAGNOSIS — Z1231 Encounter for screening mammogram for malignant neoplasm of breast: Secondary | ICD-10-CM

## 2019-06-10 DIAGNOSIS — N2581 Secondary hyperparathyroidism of renal origin: Secondary | ICD-10-CM | POA: Diagnosis not present

## 2019-06-10 DIAGNOSIS — I129 Hypertensive chronic kidney disease with stage 1 through stage 4 chronic kidney disease, or unspecified chronic kidney disease: Secondary | ICD-10-CM | POA: Diagnosis not present

## 2019-06-10 DIAGNOSIS — D631 Anemia in chronic kidney disease: Secondary | ICD-10-CM | POA: Diagnosis not present

## 2019-06-10 DIAGNOSIS — N184 Chronic kidney disease, stage 4 (severe): Secondary | ICD-10-CM | POA: Diagnosis not present

## 2019-06-10 DIAGNOSIS — E785 Hyperlipidemia, unspecified: Secondary | ICD-10-CM | POA: Diagnosis not present

## 2019-06-17 ENCOUNTER — Ambulatory Visit
Admission: RE | Admit: 2019-06-17 | Discharge: 2019-06-17 | Disposition: A | Discharge status: Home / Self Care | Payer: Medicare Other | Source: Ambulatory Visit | Attending: Family Medicine | Admitting: Family Medicine

## 2019-06-17 ENCOUNTER — Other Ambulatory Visit: Payer: Self-pay

## 2019-06-17 DIAGNOSIS — Z1231 Encounter for screening mammogram for malignant neoplasm of breast: Secondary | ICD-10-CM

## 2019-06-20 ENCOUNTER — Ambulatory Visit: Payer: Medicare Other | Attending: Internal Medicine

## 2019-06-20 DIAGNOSIS — Z23 Encounter for immunization: Secondary | ICD-10-CM | POA: Insufficient documentation

## 2019-06-20 NOTE — Progress Notes (Signed)
   Covid-19 Vaccination Clinic  Name:  Martha Ortega    MRN: [id number] DOB: 1934/08/31  06/20/2019  [name] was observed post Covid-19 immunization for 15 minutes without incidence. She was provided with Vaccine Information Sheet and instruction to access the V-Safe system.   [name] was instructed to call 911 with any severe reactions post vaccine: Marland Kitchen Difficulty breathing  . Swelling of your face and throat  . A fast heartbeat  . A bad rash all over your body  . Dizziness and weakness    Immunizations Administered    Name Date Dose VIS Date Route   Pfizer COVID-19 Vaccine 06/20/2019  1:31 PM 0.3 mL 04/05/2019 Intramuscular   Manufacturer: [place]   Lot: [id number]   [place]: [id number]

## 2019-07-04 DIAGNOSIS — I129 Hypertensive chronic kidney disease with stage 1 through stage 4 chronic kidney disease, or unspecified chronic kidney disease: Secondary | ICD-10-CM | POA: Diagnosis not present

## 2019-07-16 ENCOUNTER — Ambulatory Visit: Payer: Medicare Other | Attending: Internal Medicine

## 2019-07-16 DIAGNOSIS — Z23 Encounter for immunization: Secondary | ICD-10-CM

## 2019-07-16 NOTE — Progress Notes (Signed)
   Covid-19 Vaccination Clinic  Name:  Martha Ortega    MRN: [id number] DOB: 1934-12-18  07/16/2019  Martha Ortega was observed post Covid-19 immunization for 30 minutes based on pre-vaccination screening without incident. She was provided with Vaccine Information Sheet and instruction to access the V-Safe system.   Martha Ortega was instructed to call 911 with any severe reactions post vaccine: Marland Kitchen Difficulty breathing  . Swelling of face and throat  . A fast heartbeat  . A bad rash all over body  . Dizziness and weakness   Immunizations Administered    Name Date Dose VIS Date Route   Pfizer COVID-19 Vaccine 07/16/2019  8:58 AM 0.3 mL 04/05/2019 Intramuscular   Manufacturer: [place]   Lot: [id number]   [place]: [id number]

## 2019-08-13 DIAGNOSIS — H40023 Open angle with borderline findings, high risk, bilateral: Secondary | ICD-10-CM | POA: Diagnosis not present

## 2019-09-26 DIAGNOSIS — I129 Hypertensive chronic kidney disease with stage 1 through stage 4 chronic kidney disease, or unspecified chronic kidney disease: Secondary | ICD-10-CM | POA: Diagnosis not present

## 2019-09-26 DIAGNOSIS — D631 Anemia in chronic kidney disease: Secondary | ICD-10-CM | POA: Diagnosis not present

## 2019-09-26 DIAGNOSIS — N184 Chronic kidney disease, stage 4 (severe): Secondary | ICD-10-CM | POA: Diagnosis not present

## 2019-10-18 ENCOUNTER — Other Ambulatory Visit: Payer: Self-pay | Admitting: Family Medicine

## 2019-10-31 ENCOUNTER — Telehealth: Payer: Self-pay | Admitting: Family Medicine

## 2019-10-31 DIAGNOSIS — I10 Essential (primary) hypertension: Secondary | ICD-10-CM

## 2019-10-31 DIAGNOSIS — E7800 Pure hypercholesterolemia, unspecified: Secondary | ICD-10-CM

## 2019-10-31 NOTE — Telephone Encounter (Signed)
-----   Message from Cloyd Stagers, RT sent at 10/17/2019  2:19 PM EDT ----- Regarding: Lab Orders for Friday 7.9.2021 Please place lab orders for Friday 7.9.2021, office visit for physical on Friday 7.16.2021 Thank you, Dyke Maes RT(R)

## 2019-11-01 ENCOUNTER — Other Ambulatory Visit (INDEPENDENT_AMBULATORY_CARE_PROVIDER_SITE_OTHER): Payer: Medicare Other

## 2019-11-01 ENCOUNTER — Other Ambulatory Visit: Payer: Self-pay

## 2019-11-01 DIAGNOSIS — E7800 Pure hypercholesterolemia, unspecified: Secondary | ICD-10-CM | POA: Diagnosis not present

## 2019-11-01 DIAGNOSIS — I10 Essential (primary) hypertension: Secondary | ICD-10-CM | POA: Diagnosis not present

## 2019-11-01 LAB — CBC WITH DIFFERENTIAL/PLATELET
Basophils Absolute: 0 10*3/uL (ref 0.0–0.1)
Basophils Relative: 0.3 % (ref 0.0–3.0)
Eosinophils Absolute: 0.6 10*3/uL (ref 0.0–0.7)
Eosinophils Relative: 7.7 % — ABNORMAL HIGH (ref 0.0–5.0)
HCT: 35.7 % — ABNORMAL LOW (ref 36.0–46.0)
Hemoglobin: 11.7 g/dL — ABNORMAL LOW (ref 12.0–15.0)
Lymphocytes Relative: 46.4 % — ABNORMAL HIGH (ref 12.0–46.0)
Lymphs Abs: 3.6 10*3/uL (ref 0.7–4.0)
MCHC: 32.9 g/dL (ref 30.0–36.0)
MCV: 85.1 fl (ref 78.0–100.0)
Monocytes Absolute: 0.6 10*3/uL (ref 0.1–1.0)
Monocytes Relative: 7.7 % (ref 3.0–12.0)
Neutro Abs: 2.9 10*3/uL (ref 1.4–7.7)
Neutrophils Relative %: 37.9 % — ABNORMAL LOW (ref 43.0–77.0)
Platelets: 233 10*3/uL (ref 150.0–400.0)
RBC: 4.2 Mil/uL (ref 3.87–5.11)
RDW: 15.9 % — ABNORMAL HIGH (ref 11.5–15.5)
WBC: 7.8 10*3/uL (ref 4.0–10.5)

## 2019-11-01 LAB — COMPREHENSIVE METABOLIC PANEL
ALT: 7 U/L (ref 0–35)
AST: 13 U/L (ref 0–37)
Albumin: 3.8 g/dL (ref 3.5–5.2)
Alkaline Phosphatase: 51 U/L (ref 39–117)
BUN: 23 mg/dL (ref 6–23)
CO2: 28 mEq/L (ref 19–32)
Calcium: 9.6 mg/dL (ref 8.4–10.5)
Chloride: 105 mEq/L (ref 96–112)
Creatinine, Ser: 2.06 mg/dL — ABNORMAL HIGH (ref 0.40–1.20)
GFR: 27.85 mL/min — ABNORMAL LOW (ref >60.00)
Glucose, Bld: 121 mg/dL — ABNORMAL HIGH (ref 70–99)
Potassium: 4.6 mEq/L (ref 3.5–5.1)
Sodium: 140 mEq/L (ref 135–145)
Total Bilirubin: 0.4 mg/dL (ref 0.2–1.2)
Total Protein: 6.7 g/dL (ref 6.0–8.3)

## 2019-11-01 LAB — LIPID PANEL
Cholesterol: 124 mg/dL (ref 0–200)
HDL: 39.1 mg/dL (ref >39.00)
LDL Cholesterol: 63 mg/dL (ref 0–99)
NonHDL: 84.64
Total CHOL/HDL Ratio: 3
Triglycerides: 109 mg/dL (ref 0.0–149.0)
VLDL: 21.8 mg/dL (ref 0.0–40.0)

## 2019-11-01 LAB — TSH: TSH: 3.14 u[IU]/mL (ref 0.35–4.50)

## 2019-11-08 ENCOUNTER — Ambulatory Visit (INDEPENDENT_AMBULATORY_CARE_PROVIDER_SITE_OTHER): Payer: Medicare Other | Admitting: Family Medicine

## 2019-11-08 ENCOUNTER — Encounter: Payer: Self-pay | Admitting: Family Medicine

## 2019-11-08 ENCOUNTER — Other Ambulatory Visit: Payer: Self-pay

## 2019-11-08 VITALS — BP 140/82 | HR 66 | Temp 96.0°F | Ht 62.0 in | Wt 135.0 lb

## 2019-11-08 DIAGNOSIS — N289 Disorder of kidney and ureter, unspecified: Secondary | ICD-10-CM | POA: Diagnosis not present

## 2019-11-08 DIAGNOSIS — E2839 Other primary ovarian failure: Secondary | ICD-10-CM | POA: Diagnosis not present

## 2019-11-08 DIAGNOSIS — I10 Essential (primary) hypertension: Secondary | ICD-10-CM

## 2019-11-08 DIAGNOSIS — E7800 Pure hypercholesterolemia, unspecified: Secondary | ICD-10-CM | POA: Diagnosis not present

## 2019-11-08 DIAGNOSIS — M8589 Other specified disorders of bone density and structure, multiple sites: Secondary | ICD-10-CM

## 2019-11-08 DIAGNOSIS — Z0000 Encounter for general adult medical examination without abnormal findings: Secondary | ICD-10-CM

## 2019-11-08 MED ORDER — ROSUVASTATIN CALCIUM 20 MG PO TABS: 20.0000 mg | ORAL_TABLET | Freq: Every day | ORAL | 3 refills | Status: DC

## 2019-11-08 NOTE — Progress Notes (Signed)
Subjective:    Patient ID: Martha Ortega, female    DOB: 10-Mar-1935, 85 y.o.   MRN: [id number]  This visit occurred during the SARS-CoV-2 public health emergency.  Safety protocols were in place, including screening questions prior to the visit, additional usage of staff PPE, and extensive cleaning of exam room while observing appropriate contact time as indicated for disinfecting solutions.    HPI Pt presents for amw and rev of chronic medical problems   I have personally reviewed the Medicare Annual Wellness questionnaire and have noted 1. The patient's medical and social history 2. Their use of alcohol, tobacco or illicit drugs 3. Their current medications and supplements 4. The patient's functional ability including ADL's, fall risks, home safety risks and hearing or visual             impairment. 5. Diet and physical activities 6. Evidence for depression or mood disorders  The patients weight, height, BMI have been recorded in the chart and visual acuity is per eye clinic.  I have made referrals, counseling and provided education to the patient based review of the above and I have provided the pt with a written personalized care plan for preventive services. Reviewed and updated provider list, see scanned forms.  See scanned forms.  Routine anticipatory guidance given to patient.  See health maintenance. Colon cancer screening-out aged  Breast cancer screening   Mammogram 2/21 Self breast exam no lumps or changes  Flu vaccine 10/20 covid vaccine- pfizer, completed Tetanus vaccine  Td 7/10 (def for financial reasons) Pneumovax- complete  Zoster vaccine zostavax 04  Dexa 7/19 -osteopenia / had worsened slt Falls-none Fractures-none Supplements-ca and D  Exercise - routine activity/walking   Advance directive- has not done yet-given the materials to do Cognitive function addressed- see scanned forms- and if abnormal then additional documentation follows.  No changes in  memory  Doing well  Takes care of her own finances   Care team Nephrology- Jamal Maes  [facility] [place] [facility] [place] [facility] [place] and [facility] reviewed  Meds, vitals, and allergies reviewed.   ROS: See HPI.  Otherwise negative.    Weight : Wt Readings from Last 3 Encounters:  11/08/19 135 lb (61.2 kg)  10/09/18 142 lb (64.4 kg)  10/02/17 152 lb 8 oz (69.2 kg)  down 7 lb  Was trying to loose a little  Appetite is not as good as it used to be  24.69 kg/m   Hearing/vision:  Hearing Screening   125Hz  250Hz  500Hz  1000Hz  2000Hz  3000Hz  4000Hz  6000Hz  8000Hz   Right ear:    20       Left ear:    25        No change in hearing -per pt  Does not desire addl hearing eval Eye exam was within the year-all was ok /no change in vision    HTN bp is stable today  No cp or palpitations or headaches or edema  No side effects to medicines  BP Readings from Last 3 Encounters:  11/08/19 140/82  10/09/18 (!) 143/80  10/02/17 (!) 150/80    Pt wishes she could come off the hydralazine  She feels a little swollen under jaw  A little more on the R side -not painful or tender  No sob or rash or itching    Pulse Readings from Last 3 Encounters:  11/08/19 66  10/09/18 80  10/02/17 63    Renal insuff Lab Results  Component  Value Date   CREATININE 2.06 (H) 11/01/2019   BUN 23 11/01/2019   NA 140 11/01/2019   K 4.6 11/01/2019   CL 105 11/01/2019   CO2 28 11/01/2019  sees nephrology  She is very good about drinking water  Avoids nsaids    H/o CVA - caused a tight feeling which persisted  No recent neuro visits     Hyperlipidemia Lab Results  Component Value Date   CHOL 124 11/01/2019   CHOL 124 10/09/2018   CHOL 128 10/02/2017   Lab Results  Component Value Date   HDL 39.10 11/01/2019   HDL 35.90 (L) 10/09/2018   HDL 32.70 (L) 10/02/2017   Lab Results  Component Value Date   LDLCALC 63 11/01/2019   LDLCALC 66 10/09/2018   LDLCALC 71 10/02/2017     Lab Results  Component Value Date   TRIG 109.0 11/01/2019   TRIG 112.0 10/09/2018   TRIG 122.0 10/02/2017   Lab Results  Component Value Date   CHOLHDL 3 11/01/2019   CHOLHDL 3 10/09/2018   CHOLHDL 4 10/02/2017   Lab Results  Component Value Date   LDLDIRECT 172.8 01/14/2013   LDLDIRECT 158.1 12/12/2011   LDLDIRECT 179.5 06/10/2011   Takes crestor  Diet -is good   Glucose of 121 - had just eaten breakfast for the draw   Lab Results  Component Value Date   ALT 7 11/01/2019   AST 13 11/01/2019   ALKPHOS 51 11/01/2019   BILITOT 0.4 11/01/2019   Lab Results  Component Value Date   WBC 7.8 11/01/2019   HGB 11.7 (L) 11/01/2019   HCT 35.7 (L) 11/01/2019   MCV 85.1 11/01/2019   PLT 233.0 11/01/2019   Lab Results  Component Value Date   TSH 3.14 11/01/2019     Patient Active Problem List   Diagnosis Date Noted  . Osteopenia 07/26/2015  . Estrogen deficiency 06/23/2015  . History of CVA (cerebrovascular accident) 03/02/2015  . Encounter for Medicare annual wellness exam 09/23/2013  . Spinal stenosis of lumbar region 09/23/2013  . GERD (gastroesophageal reflux disease) 12/12/2011  . Renal insufficiency 11/26/2008  . ALLERGIC RHINITIS 09/10/2007  . Hyperlipidemia 09/08/2006  . Essential hypertension 09/08/2006  . POSTNASAL DRIP SYNDROME 09/08/2006  . DIVERTICULITIS, HX OF 09/08/2006   Past Medical History:  Diagnosis Date  . Abnormal blood findings    baseline cr 1.3, watching for renal insufficiency  . Allergic rhinitis   . History of diverticulitis of colon   . Hyperglycemia 09   mild  . Hyperlipidemia   . Hypertension   . Stroke (cerebrum) [facility])    Past Surgical History:  Procedure Laterality Date  . ABDOMINAL HYSTERECTOMY    . APPENDECTOMY     Patient states not sure if this is true  . barium swallow  10/18/03  . CARDIOVASCULAR STRESS TEST  07/21/04  . CYSTECTOMY  93   R axilla  . CYSTECTOMY  97   R breast   Social History   Tobacco Use   . Smoking status: Former Smoker    Quit date: 04/25/1978    Years since quitting: 41.5  . Smokeless tobacco: Never Used  Vaping Use  . Vaping Use: Never used  Substance Use Topics  . Alcohol use: No    Alcohol/week: 0.0 standard drinks  . Drug use: No   Family History  Problem Relation Age of Onset  . Arthritis Mother   . Arthritis Sister   . Lupus Sister        ?  Marland Kitchen  Diabetes Sister   . Prostate cancer Brother   . Stroke Father   . Hypertension Father        ?   Allergies  Allergen Reactions  . Amlodipine Swelling    Throat and mouth swelling  . Fluticasone Propionate     REACTION: does not help  . Loratadine     REACTION: does not work  . Lovastatin     REACTION: was not effective  . Omeprazole Swelling    Throat, lips, and mouth swelling  . Zocor [Simvastatin - High Dose]     Mouth and throat swelled   Current Outpatient Medications on File Prior to Visit  Medication Sig Dispense Refill  . aspirin 81 MG tablet Take 81 mg by mouth daily.    . Calcium Carbonate-Vit D-Min (CALCIUM 1200 PO) Take by mouth.    . hydrALAZINE (APRESOLINE) 25 MG tablet Take 25 mg by mouth 3 (three) times daily.     Marland Kitchen ketotifen (ZADITOR) 0.025 % ophthalmic solution Apply to eye.    . losartan-hydrochlorothiazide (HYZAAR) 100-25 MG tablet Take 1 tablet by mouth daily.    . metoprolol succinate (TOPROL-XL) 50 MG 24 hr tablet TAKE 1 TABLET DAILY WITH OR IMMEDIATELY FOLLOWING A MEAL 90 tablet 1   No current facility-administered medications on file prior to visit.     Review of Systems  Constitutional: Negative for activity change, appetite change, fatigue, fever and unexpected weight change.  HENT: Negative for congestion, ear pain, rhinorrhea, sinus pressure and sore throat.   Eyes: Negative for pain, redness and visual disturbance.  Respiratory: Negative for cough, shortness of breath and wheezing.   Cardiovascular: Negative for chest pain and palpitations.  Gastrointestinal: Negative  for abdominal pain, blood in stool, constipation and diarrhea.  Endocrine: Negative for polydipsia and polyuria.  Genitourinary: Negative for dysuria, frequency and urgency.  Musculoskeletal: Positive for arthralgias. Negative for back pain and myalgias.  Skin: Negative for pallor and rash.  Allergic/Immunologic: Negative for environmental allergies.  Neurological: Negative for dizziness, syncope and headaches.  Hematological: Negative for adenopathy. Does not bruise/bleed easily.  Psychiatric/Behavioral: Negative for decreased concentration and dysphoric mood. The patient is not nervous/anxious.        Objective:   Physical Exam Constitutional:      General: She is not in acute distress.    Appearance: Normal appearance. She is well-developed and normal weight. She is not ill-appearing or diaphoretic.  HENT:     Head: Normocephalic and atraumatic.     Right Ear: Tympanic membrane, ear canal and external ear normal.     Left Ear: Tympanic membrane, ear canal and external ear normal.     Nose: Nose normal. No congestion.     Mouth/Throat:     Mouth: Mucous membranes are moist.     Pharynx: Oropharynx is clear. No posterior oropharyngeal erythema.  Eyes:     General: No scleral icterus.    Extraocular Movements: Extraocular movements intact.     Conjunctiva/sclera: Conjunctivae normal.     Pupils: Pupils are equal, round, and reactive to light.  Neck:     Thyroid: No thyromegaly.     Vascular: No carotid bruit or JVD.  Cardiovascular:     Rate and Rhythm: Normal rate and regular rhythm.     Pulses: Normal pulses.     Heart sounds: Normal heart sounds. No gallop.   Pulmonary:     Effort: Pulmonary effort is normal. No respiratory distress.     Breath sounds: Normal  breath sounds. No wheezing.     Comments: Good air exch Chest:     Chest wall: No tenderness.  Abdominal:     General: Bowel sounds are normal. There is no distension or abdominal bruit.     Palpations: Abdomen is  soft. There is no mass.     Tenderness: There is no abdominal tenderness.     Hernia: No hernia is present.  Genitourinary:    Comments: Breast exam: No mass, nodules, thickening, tenderness, bulging, retraction, inflamation, nipple discharge or skin changes noted.  No axillary or clavicular LA.     Musculoskeletal:        General: No tenderness. Normal range of motion.     Cervical back: Normal range of motion and neck supple. No rigidity. No muscular tenderness.     Right lower leg: No edema.     Left lower leg: No edema.  Lymphadenopathy:     Cervical: No cervical adenopathy.  Skin:    General: Skin is warm and dry.     Coloration: Skin is not pale.     Findings: No erythema or rash.     Comments: Some skin tags and sk Few comedones on back  Neurological:     Mental Status: She is alert. Mental status is at baseline.     Cranial Nerves: No cranial nerve deficit.     Motor: No abnormal muscle tone.     Coordination: Coordination normal.     Gait: Gait normal.     Deep Tendon Reflexes: Reflexes are normal and symmetric.  Psychiatric:        Mood and Affect: Mood normal.        Cognition and Memory: Cognition and memory normal.           Assessment & Plan:   Problem List Items Addressed This Visit      Cardiovascular and Mediastinum   Essential hypertension    bp in fair control at this time  BP Readings from Last 1 Encounters:  11/08/19 140/82   No changes needed Most recent labs reviewed  Disc lifstyle change with low sodium diet and exercise        Relevant Medications   losartan-hydrochlorothiazide (HYZAAR) 100-25 MG tablet   rosuvastatin (CRESTOR) 20 MG tablet     Musculoskeletal and Integument   Osteopenia    dexa ordered- pt will schedule  Last time had worsened slt  No falls or fx  Encouraged walking for exercise with safety measures  Taking ca and D        Genitourinary   Renal insufficiency    Cr 2.06 Pt sees nephrology  Taking  hydralazine for bp  Enc good water intake and avoidance of nsaids        Other   Hyperlipidemia    Disc goals for lipids and reasons to control them Rev last labs with pt Rev low sat fat diet in detail  LDL is 9 Well controlled with crestor and diet        Relevant Medications   losartan-hydrochlorothiazide (HYZAAR) 100-25 MG tablet   rosuvastatin (CRESTOR) 20 MG tablet   Encounter for Medicare annual wellness exam - Primary    Reviewed health habits including diet and exercise and skin cancer prevention Reviewed appropriate screening tests for age  Also reviewed health mt list, fam hx and immunization status , as well as social and family history   See HPI Labs reviewed  Immunized for covid  Discussed shingrix vaccine-interested if  affordable  dexa ordered (no falls or fx)  Given materials to work on [employer] directive No cognitive concerns  No changes in hearing  Eye exam utd-no c/o        Estrogen deficiency   Relevant Orders   DG Bone Density

## 2019-11-08 NOTE — Patient Instructions (Addendum)
If you are interested in the new shingles vaccine (Shingrix) - call your local pharmacy to check on coverage and availability  If affordable, get on a wait list at your pharmacy to get the vaccine.    call to schedule your bone density test whenever it is convenient   Please work on your advance directive - blue packet   Take care of yourself  Get outdoors a bit when it cools down   Keep drinking water  Keep seeing the kidney doctor

## 2019-11-10 NOTE — Assessment & Plan Note (Signed)
Cr 2.06 Pt sees nephrology  Taking hydralazine for bp  Enc good water intake and avoidance of nsaids

## 2019-11-10 NOTE — Assessment & Plan Note (Signed)
Reviewed health habits including diet and exercise and skin cancer prevention Reviewed appropriate screening tests for age  Also reviewed health mt list, fam hx and immunization status , as well as social and family history   See HPI Labs reviewed  Immunized for covid  Discussed shingrix vaccine-interested if affordable  dexa ordered (no falls or fx)  Given materials to work on [employer] directive No cognitive concerns  No changes in hearing  Eye exam utd-no c/o

## 2019-11-10 NOTE — Assessment & Plan Note (Signed)
Disc goals for lipids and reasons to control them Rev last labs with pt Rev low sat fat diet in detail  LDL is 63 Well controlled with crestor and diet

## 2019-11-10 NOTE — Assessment & Plan Note (Signed)
dexa ordered- pt will schedule  Last time had worsened slt  No falls or fx  Encouraged walking for exercise with safety measures  Taking ca and D

## 2019-11-10 NOTE — Assessment & Plan Note (Signed)
bp in fair control at this time  BP Readings from Last 1 Encounters:  11/08/19 140/82   No changes needed Most recent labs reviewed  Disc lifstyle change with low sodium diet and exercise

## 2019-12-05 ENCOUNTER — Other Ambulatory Visit: Payer: Self-pay | Admitting: Family Medicine

## 2019-12-18 DIAGNOSIS — H40023 Open angle with borderline findings, high risk, bilateral: Secondary | ICD-10-CM | POA: Diagnosis not present

## 2019-12-18 DIAGNOSIS — H25013 Cortical age-related cataract, bilateral: Secondary | ICD-10-CM | POA: Diagnosis not present

## 2019-12-18 DIAGNOSIS — H524 Presbyopia: Secondary | ICD-10-CM | POA: Diagnosis not present

## 2019-12-18 DIAGNOSIS — H2513 Age-related nuclear cataract, bilateral: Secondary | ICD-10-CM | POA: Diagnosis not present

## 2020-01-15 ENCOUNTER — Other Ambulatory Visit: Payer: Self-pay

## 2020-01-15 ENCOUNTER — Ambulatory Visit
Admission: RE | Admit: 2020-01-15 | Discharge: 2020-01-15 | Disposition: A | Discharge status: Home / Self Care | Payer: Medicare Other | Source: Ambulatory Visit | Attending: Family Medicine | Admitting: Family Medicine

## 2020-01-15 DIAGNOSIS — M85851 Other specified disorders of bone density and structure, right thigh: Secondary | ICD-10-CM | POA: Diagnosis not present

## 2020-01-15 DIAGNOSIS — Z780 Asymptomatic menopausal state: Secondary | ICD-10-CM | POA: Diagnosis not present

## 2020-01-15 DIAGNOSIS — E2839 Other primary ovarian failure: Secondary | ICD-10-CM

## 2020-01-17 ENCOUNTER — Encounter: Payer: Self-pay | Admitting: *Deleted

## 2020-02-05 DIAGNOSIS — N184 Chronic kidney disease, stage 4 (severe): Secondary | ICD-10-CM | POA: Diagnosis not present

## 2020-02-11 DIAGNOSIS — I129 Hypertensive chronic kidney disease with stage 1 through stage 4 chronic kidney disease, or unspecified chronic kidney disease: Secondary | ICD-10-CM | POA: Diagnosis not present

## 2020-02-11 DIAGNOSIS — D631 Anemia in chronic kidney disease: Secondary | ICD-10-CM | POA: Diagnosis not present

## 2020-02-11 DIAGNOSIS — N184 Chronic kidney disease, stage 4 (severe): Secondary | ICD-10-CM | POA: Diagnosis not present

## 2020-02-13 ENCOUNTER — Ambulatory Visit: Payer: Medicare Other | Attending: Internal Medicine

## 2020-02-13 DIAGNOSIS — Z23 Encounter for immunization: Secondary | ICD-10-CM

## 2020-02-13 NOTE — Progress Notes (Signed)
   Covid-19 Vaccination Clinic  Name:  JAYNA MULNIX    MRN: [id number] DOB: [birth year]  02/13/2020  Ms. Sellin was observed post Covid-19 immunization for 15 minutes without incident. She was provided with Vaccine Information Sheet and instruction to access the V-Safe system.   Ms. Brun was instructed to call 911 with any severe reactions post vaccine: Marland Kitchen Difficulty breathing  . Swelling of face and throat  . A fast heartbeat  . A bad rash all over body  . Dizziness and weakness

## 2020-02-25 DIAGNOSIS — I129 Hypertensive chronic kidney disease with stage 1 through stage 4 chronic kidney disease, or unspecified chronic kidney disease: Secondary | ICD-10-CM | POA: Diagnosis not present

## 2020-02-25 DIAGNOSIS — N184 Chronic kidney disease, stage 4 (severe): Secondary | ICD-10-CM | POA: Diagnosis not present

## 2020-04-06 ENCOUNTER — Encounter: Payer: Self-pay | Admitting: Internal Medicine

## 2020-04-06 ENCOUNTER — Ambulatory Visit (INDEPENDENT_AMBULATORY_CARE_PROVIDER_SITE_OTHER): Payer: Medicare Other | Admitting: Internal Medicine

## 2020-04-06 ENCOUNTER — Other Ambulatory Visit: Payer: Self-pay

## 2020-04-06 DIAGNOSIS — R1030 Lower abdominal pain, unspecified: Secondary | ICD-10-CM

## 2020-04-06 DIAGNOSIS — E441 Mild protein-calorie malnutrition: Secondary | ICD-10-CM | POA: Insufficient documentation

## 2020-04-06 DIAGNOSIS — N184 Chronic kidney disease, stage 4 (severe): Secondary | ICD-10-CM

## 2020-04-06 DIAGNOSIS — R109 Unspecified abdominal pain: Secondary | ICD-10-CM | POA: Insufficient documentation

## 2020-04-06 MED ORDER — AMOXICILLIN-POT CLAVULANATE 875-125 MG PO TABS: 1.0000 | ORAL_TABLET | Freq: Two times a day (BID) | ORAL | 0 refills | Status: AC

## 2020-04-06 NOTE — Assessment & Plan Note (Signed)
Continues to lose weight Might want to consider mirtazapine Discussed boost or ensure (she does drink these)

## 2020-04-06 NOTE — Assessment & Plan Note (Signed)
Will recheck to make sure no decline

## 2020-04-06 NOTE — Assessment & Plan Note (Signed)
She points to umbilicus but tenderness is prominent in LLQ Diverticulitis is most likely but the time course is not typical Pancreatitis is possible She could have nausea related to CKD but I don't think it would cause the pain Unlikely gastric etiology (gastritis or ulcer or reflux) or gallbladder  Will check labs Empiric augmentin Will plan CT of abdomen (no contrast) if not better in next couple of days

## 2020-04-06 NOTE — Patient Instructions (Addendum)
Please try the antibiotic twice a day with food. If you are not feeling better in the next couple of days, I will set you up for a CT scan. You can try acetaminophen (tylenol) regularly to help the pain.

## 2020-04-06 NOTE — Progress Notes (Signed)
Subjective:    Patient ID: Martha Ortega, female    DOB: 1934-12-14, 85 y.o.   MRN: [id number]  HPI Here due to abdominal pain This visit occurred during the SARS-CoV-2 public health emergency.  Safety protocols were in place, including screening questions prior to the visit, additional usage of staff PPE, and extensive cleaning of exam room while observing appropriate contact time as indicated for disinfecting solutions.   Has had some pain for a week now Intermittent ---real bad this morning (points to periumbilical area) Called for appt then---but some better Nausea in first days and some vomiting---none since  Appetite is gone  Has lost 10# in the past year or so Bowels are normal--usually daily (occasionally every other day). Normal color (or some "brick color"). Doesn't see blood  Tried some tylenol--may have helped (but slowly) Comes "like a cramp"  Not continuous but recurs a while later (30-60 minutes) Pain doesn't seem to be related to eating  Current Outpatient Medications on File Prior to Visit  Medication Sig Dispense Refill  . aspirin 81 MG tablet Take 81 mg by mouth daily.    . Calcium Carbonate-Vit D-Min (CALCIUM 1200 PO) Take by mouth.    . hydrALAZINE (APRESOLINE) 25 MG tablet Take 25 mg by mouth 3 (three) times daily.     Marland Kitchen ketotifen (ZADITOR) 0.025 % ophthalmic solution Apply to eye.    . losartan-hydrochlorothiazide (HYZAAR) 100-25 MG tablet Take 1 tablet by mouth daily.    . metoprolol succinate (TOPROL-XL) 50 MG 24 hr tablet TAKE 1 TABLET DAILY WITH OR IMMEDIATELY FOLLOWING A MEAL 90 tablet 1  . rosuvastatin (CRESTOR) 20 MG tablet TAKE 1 TABLET DAILY 90 tablet 3   No current facility-administered medications on file prior to visit.    Allergies  Allergen Reactions  . Amlodipine Swelling    Throat and mouth swelling  . Fluticasone Propionate     REACTION: does not help  . Loratadine     REACTION: does not work  . Lovastatin     REACTION: was not  effective  . Omeprazole Swelling    Throat, lips, and mouth swelling  . Zocor [Simvastatin - High Dose]     Mouth and throat swelled    Past Medical History:  Diagnosis Date  . Abnormal blood findings    baseline cr 1.3, watching for renal insufficiency  . Allergic rhinitis   . History of diverticulitis of colon   . Hyperglycemia 09   mild  . Hyperlipidemia   . Hypertension   . Stroke (cerebrum) [facility])     Past Surgical History:  Procedure Laterality Date  . ABDOMINAL HYSTERECTOMY    . APPENDECTOMY     Patient states not sure if this is true  . barium swallow  10/18/03  . CARDIOVASCULAR STRESS TEST  07/21/04  . CYSTECTOMY  93   R axilla  . CYSTECTOMY  97   R breast    Family History  Problem Relation Age of Onset  . Arthritis Mother   . Arthritis Sister   . Lupus Sister        ?  . Diabetes Sister   . Prostate cancer Brother   . Stroke Father   . Hypertension Father        ?    Social History   Socioeconomic History  . Marital status: Widowed    Spouse name: Not on file  . Number of children: Not on file  . Years of education: Not on  file  . Highest education level: Not on file  Occupational History  . Occupation: unemployed  Tobacco Use  . Smoking status: Former Smoker    Quit date: 04/25/1978    Years since quitting: 41.9  . Smokeless tobacco: Never Used  Vaping Use  . Vaping Use: Never used  Substance and Sexual Activity  . Alcohol use: No    Alcohol/week: 0.0 standard drinks  . Drug use: No  . Sexual activity: Not Currently  Other Topics Concern  . Not on file  Social History Narrative   Widowed, not working, son lives close by   Regular exercise: walking videos 3-4 times weekly            Social Determinants of Health   Financial Resource Strain: Not on file  Food Insecurity: Not on file  Transportation Needs: Not on file  Physical Activity: Not on file  Stress: Not on file  Social Connections: Not on file  Intimate Partner  Violence: Not on file   Review of Systems Chronic left side pain since stroke 5 years ago--this is different No dysuria or hematuria No cough or SOB No heartburn GFR in 20's    Objective:   Physical Exam Constitutional:      Appearance: She is well-developed.  Cardiovascular:     Rate and Rhythm: Normal rate and regular rhythm.     Heart sounds: No murmur heard. No gallop.   Pulmonary:     Effort: Pulmonary effort is normal.     Breath sounds: Normal breath sounds. No wheezing or rales.  Abdominal:     General: Abdomen is flat. Bowel sounds are normal.     Palpations: Abdomen is soft. There is no hepatomegaly or mass.     Tenderness: There is abdominal tenderness in the right lower quadrant. There is no guarding or rebound.     Hernia: There is no hernia in the umbilical area or ventral area.  Neurological:     Mental Status: She is alert.            Assessment & Plan:

## 2020-04-07 ENCOUNTER — Other Ambulatory Visit: Payer: Self-pay | Admitting: Internal Medicine

## 2020-04-07 DIAGNOSIS — N184 Chronic kidney disease, stage 4 (severe): Secondary | ICD-10-CM

## 2020-04-07 LAB — CBC
HCT: 36.6 % (ref 36.0–46.0)
Hemoglobin: 12.1 g/dL (ref 12.0–15.0)
MCHC: 33.1 g/dL (ref 30.0–36.0)
MCV: 83 fl (ref 78.0–100.0)
Platelets: 286 10*3/uL (ref 150.0–400.0)
RBC: 4.41 Mil/uL (ref 3.87–5.11)
RDW: 15.2 % (ref 11.5–15.5)
WBC: 10 10*3/uL (ref 4.0–10.5)

## 2020-04-07 LAB — HEPATIC FUNCTION PANEL
ALT: 7 U/L (ref 0–35)
AST: 19 U/L (ref 0–37)
Albumin: 3.7 g/dL (ref 3.5–5.2)
Alkaline Phosphatase: 55 U/L (ref 39–117)
Bilirubin, Direct: 0.2 mg/dL (ref 0.0–0.3)
Total Bilirubin: 0.7 mg/dL (ref 0.2–1.2)
Total Protein: 7.7 g/dL (ref 6.0–8.3)

## 2020-04-07 LAB — RENAL FUNCTION PANEL
Albumin: 3.7 g/dL (ref 3.5–5.2)
BUN: 30 mg/dL — ABNORMAL HIGH (ref 6–23)
CO2: 24 mEq/L (ref 19–32)
Calcium: 9.1 mg/dL (ref 8.4–10.5)
Chloride: 101 mEq/L (ref 96–112)
Creatinine, Ser: 2.38 mg/dL — ABNORMAL HIGH (ref 0.40–1.20)
GFR: 18.47 mL/min — ABNORMAL LOW (ref >60.00)
Glucose, Bld: 142 mg/dL — ABNORMAL HIGH (ref 70–99)
Phosphorus: 2.8 mg/dL (ref 2.3–4.6)
Potassium: 4.2 mEq/L (ref 3.5–5.1)
Sodium: 136 mEq/L (ref 135–145)

## 2020-04-07 LAB — SEDIMENTATION RATE: Sed Rate: 31 mm/hr — ABNORMAL HIGH (ref 0–30)

## 2020-04-07 LAB — LIPASE: Lipase: 32 U/L (ref 11.0–59.0)

## 2020-04-07 NOTE — Progress Notes (Signed)
neph

## 2020-04-14 ENCOUNTER — Emergency Department (HOSPITAL_COMMUNITY): Payer: Medicare Other

## 2020-04-14 ENCOUNTER — Inpatient Hospital Stay (HOSPITAL_COMMUNITY)
Admission: EM | Admit: 2020-04-14 | Discharge: 2020-05-26 | DRG: 853 | Disposition: E | Payer: Medicare Other | Attending: Family Medicine | Admitting: Family Medicine

## 2020-04-14 ENCOUNTER — Other Ambulatory Visit: Payer: Self-pay

## 2020-04-14 ENCOUNTER — Encounter (HOSPITAL_COMMUNITY): Payer: Self-pay | Admitting: Internal Medicine

## 2020-04-14 DIAGNOSIS — R6521 Severe sepsis with septic shock: Secondary | ICD-10-CM | POA: Diagnosis present

## 2020-04-14 DIAGNOSIS — K828 Other specified diseases of gallbladder: Secondary | ICD-10-CM | POA: Diagnosis not present

## 2020-04-14 DIAGNOSIS — N170 Acute kidney failure with tubular necrosis: Secondary | ICD-10-CM | POA: Diagnosis present

## 2020-04-14 DIAGNOSIS — Z8042 Family history of malignant neoplasm of prostate: Secondary | ICD-10-CM

## 2020-04-14 DIAGNOSIS — L89151 Pressure ulcer of sacral region, stage 1: Secondary | ICD-10-CM | POA: Diagnosis present

## 2020-04-14 DIAGNOSIS — E7800 Pure hypercholesterolemia, unspecified: Secondary | ICD-10-CM

## 2020-04-14 DIAGNOSIS — Z7189 Other specified counseling: Secondary | ICD-10-CM | POA: Diagnosis not present

## 2020-04-14 DIAGNOSIS — D62 Acute posthemorrhagic anemia: Secondary | ICD-10-CM | POA: Diagnosis present

## 2020-04-14 DIAGNOSIS — Z90710 Acquired absence of both cervix and uterus: Secondary | ICD-10-CM

## 2020-04-14 DIAGNOSIS — N184 Chronic kidney disease, stage 4 (severe): Secondary | ICD-10-CM

## 2020-04-14 DIAGNOSIS — Z20822 Contact with and (suspected) exposure to covid-19: Secondary | ICD-10-CM | POA: Diagnosis present

## 2020-04-14 DIAGNOSIS — Z4659 Encounter for fitting and adjustment of other gastrointestinal appliance and device: Secondary | ICD-10-CM

## 2020-04-14 DIAGNOSIS — J90 Pleural effusion, not elsewhere classified: Secondary | ICD-10-CM | POA: Diagnosis not present

## 2020-04-14 DIAGNOSIS — A419 Sepsis, unspecified organism: Secondary | ICD-10-CM

## 2020-04-14 DIAGNOSIS — K56699 Other intestinal obstruction unspecified as to partial versus complete obstruction: Secondary | ICD-10-CM | POA: Diagnosis not present

## 2020-04-14 DIAGNOSIS — K921 Melena: Secondary | ICD-10-CM | POA: Diagnosis not present

## 2020-04-14 DIAGNOSIS — E8770 Fluid overload, unspecified: Secondary | ICD-10-CM | POA: Diagnosis not present

## 2020-04-14 DIAGNOSIS — I361 Nonrheumatic tricuspid (valve) insufficiency: Secondary | ICD-10-CM | POA: Diagnosis not present

## 2020-04-14 DIAGNOSIS — I248 Other forms of acute ischemic heart disease: Secondary | ICD-10-CM | POA: Diagnosis present

## 2020-04-14 DIAGNOSIS — I639 Cerebral infarction, unspecified: Secondary | ICD-10-CM

## 2020-04-14 DIAGNOSIS — K660 Peritoneal adhesions (postprocedural) (postinfection): Secondary | ICD-10-CM | POA: Diagnosis not present

## 2020-04-14 DIAGNOSIS — Z8261 Family history of arthritis: Secondary | ICD-10-CM | POA: Diagnosis not present

## 2020-04-14 DIAGNOSIS — I129 Hypertensive chronic kidney disease with stage 1 through stage 4 chronic kidney disease, or unspecified chronic kidney disease: Secondary | ICD-10-CM | POA: Diagnosis present

## 2020-04-14 DIAGNOSIS — Z7982 Long term (current) use of aspirin: Secondary | ICD-10-CM | POA: Diagnosis not present

## 2020-04-14 DIAGNOSIS — N179 Acute kidney failure, unspecified: Secondary | ICD-10-CM | POA: Clinically undetermined

## 2020-04-14 DIAGNOSIS — N281 Cyst of kidney, acquired: Secondary | ICD-10-CM | POA: Diagnosis not present

## 2020-04-14 DIAGNOSIS — K668 Other specified disorders of peritoneum: Secondary | ICD-10-CM | POA: Diagnosis not present

## 2020-04-14 DIAGNOSIS — N189 Chronic kidney disease, unspecified: Secondary | ICD-10-CM | POA: Clinically undetermined

## 2020-04-14 DIAGNOSIS — E785 Hyperlipidemia, unspecified: Secondary | ICD-10-CM | POA: Diagnosis present

## 2020-04-14 DIAGNOSIS — I700 Atherosclerosis of aorta: Secondary | ICD-10-CM

## 2020-04-14 DIAGNOSIS — K651 Peritoneal abscess: Secondary | ICD-10-CM | POA: Diagnosis present

## 2020-04-14 DIAGNOSIS — Z4682 Encounter for fitting and adjustment of non-vascular catheter: Secondary | ICD-10-CM | POA: Diagnosis not present

## 2020-04-14 DIAGNOSIS — R578 Other shock: Secondary | ICD-10-CM | POA: Diagnosis not present

## 2020-04-14 DIAGNOSIS — K5939 Other megacolon: Secondary | ICD-10-CM | POA: Diagnosis present

## 2020-04-14 DIAGNOSIS — Z833 Family history of diabetes mellitus: Secondary | ICD-10-CM

## 2020-04-14 DIAGNOSIS — Z8673 Personal history of transient ischemic attack (TIA), and cerebral infarction without residual deficits: Secondary | ICD-10-CM

## 2020-04-14 DIAGNOSIS — Z66 Do not resuscitate: Secondary | ICD-10-CM | POA: Diagnosis present

## 2020-04-14 DIAGNOSIS — R531 Weakness: Secondary | ICD-10-CM | POA: Diagnosis not present

## 2020-04-14 DIAGNOSIS — Z832 Family history of diseases of the blood and blood-forming organs and certain disorders involving the immune mechanism: Secondary | ICD-10-CM

## 2020-04-14 DIAGNOSIS — K5721 Diverticulitis of large intestine with perforation and abscess with bleeding: Secondary | ICD-10-CM | POA: Diagnosis present

## 2020-04-14 DIAGNOSIS — K56609 Unspecified intestinal obstruction, unspecified as to partial versus complete obstruction: Secondary | ICD-10-CM | POA: Diagnosis not present

## 2020-04-14 DIAGNOSIS — K5792 Diverticulitis of intestine, part unspecified, without perforation or abscess without bleeding: Secondary | ICD-10-CM

## 2020-04-14 DIAGNOSIS — R54 Age-related physical debility: Secondary | ICD-10-CM | POA: Diagnosis present

## 2020-04-14 DIAGNOSIS — E876 Hypokalemia: Secondary | ICD-10-CM | POA: Diagnosis not present

## 2020-04-14 DIAGNOSIS — R34 Anuria and oliguria: Secondary | ICD-10-CM | POA: Diagnosis present

## 2020-04-14 DIAGNOSIS — K5669 Other partial intestinal obstruction: Secondary | ICD-10-CM | POA: Diagnosis present

## 2020-04-14 DIAGNOSIS — R778 Other specified abnormalities of plasma proteins: Secondary | ICD-10-CM | POA: Diagnosis not present

## 2020-04-14 DIAGNOSIS — K5732 Diverticulitis of large intestine without perforation or abscess without bleeding: Secondary | ICD-10-CM | POA: Diagnosis not present

## 2020-04-14 DIAGNOSIS — K5720 Diverticulitis of large intestine with perforation and abscess without bleeding: Secondary | ICD-10-CM | POA: Diagnosis present

## 2020-04-14 DIAGNOSIS — T8119XD Other postprocedural shock, subsequent encounter: Secondary | ICD-10-CM | POA: Diagnosis not present

## 2020-04-14 DIAGNOSIS — R109 Unspecified abdominal pain: Secondary | ICD-10-CM | POA: Diagnosis not present

## 2020-04-14 DIAGNOSIS — E43 Unspecified severe protein-calorie malnutrition: Secondary | ICD-10-CM | POA: Diagnosis present

## 2020-04-14 DIAGNOSIS — Z888 Allergy status to other drugs, medicaments and biological substances status: Secondary | ICD-10-CM | POA: Diagnosis not present

## 2020-04-14 DIAGNOSIS — T8119XA Other postprocedural shock, initial encounter: Secondary | ICD-10-CM | POA: Diagnosis not present

## 2020-04-14 DIAGNOSIS — R000 Tachycardia, unspecified: Secondary | ICD-10-CM | POA: Diagnosis not present

## 2020-04-14 DIAGNOSIS — I4819 Other persistent atrial fibrillation: Secondary | ICD-10-CM | POA: Diagnosis not present

## 2020-04-14 DIAGNOSIS — S3710XA Unspecified injury of ureter, initial encounter: Secondary | ICD-10-CM | POA: Diagnosis not present

## 2020-04-14 DIAGNOSIS — Z452 Encounter for adjustment and management of vascular access device: Secondary | ICD-10-CM | POA: Diagnosis not present

## 2020-04-14 DIAGNOSIS — D72829 Elevated white blood cell count, unspecified: Secondary | ICD-10-CM | POA: Diagnosis not present

## 2020-04-14 DIAGNOSIS — H409 Unspecified glaucoma: Secondary | ICD-10-CM | POA: Diagnosis present

## 2020-04-14 DIAGNOSIS — R7989 Other specified abnormal findings of blood chemistry: Secondary | ICD-10-CM | POA: Diagnosis present

## 2020-04-14 DIAGNOSIS — D631 Anemia in chronic kidney disease: Secondary | ICD-10-CM | POA: Diagnosis present

## 2020-04-14 DIAGNOSIS — Z87891 Personal history of nicotine dependence: Secondary | ICD-10-CM

## 2020-04-14 DIAGNOSIS — S3710XD Unspecified injury of ureter, subsequent encounter: Secondary | ICD-10-CM

## 2020-04-14 DIAGNOSIS — K7689 Other specified diseases of liver: Secondary | ICD-10-CM | POA: Diagnosis not present

## 2020-04-14 DIAGNOSIS — Z6828 Body mass index (BMI) 28.0-28.9, adult: Secondary | ICD-10-CM

## 2020-04-14 DIAGNOSIS — L8990 Pressure ulcer of unspecified site, unspecified stage: Secondary | ICD-10-CM | POA: Insufficient documentation

## 2020-04-14 DIAGNOSIS — Z79899 Other long term (current) drug therapy: Secondary | ICD-10-CM | POA: Diagnosis not present

## 2020-04-14 DIAGNOSIS — R079 Chest pain, unspecified: Secondary | ICD-10-CM | POA: Diagnosis not present

## 2020-04-14 DIAGNOSIS — I959 Hypotension, unspecified: Secondary | ICD-10-CM | POA: Diagnosis not present

## 2020-04-14 DIAGNOSIS — E441 Mild protein-calorie malnutrition: Secondary | ICD-10-CM | POA: Diagnosis not present

## 2020-04-14 DIAGNOSIS — N3289 Other specified disorders of bladder: Secondary | ICD-10-CM | POA: Diagnosis not present

## 2020-04-14 DIAGNOSIS — I480 Paroxysmal atrial fibrillation: Secondary | ICD-10-CM | POA: Diagnosis not present

## 2020-04-14 DIAGNOSIS — I9589 Other hypotension: Secondary | ICD-10-CM | POA: Diagnosis not present

## 2020-04-14 DIAGNOSIS — R571 Hypovolemic shock: Secondary | ICD-10-CM | POA: Diagnosis not present

## 2020-04-14 DIAGNOSIS — J309 Allergic rhinitis, unspecified: Secondary | ICD-10-CM | POA: Diagnosis present

## 2020-04-14 DIAGNOSIS — E875 Hyperkalemia: Secondary | ICD-10-CM | POA: Diagnosis not present

## 2020-04-14 DIAGNOSIS — Z823 Family history of stroke: Secondary | ICD-10-CM

## 2020-04-14 DIAGNOSIS — R1084 Generalized abdominal pain: Secondary | ICD-10-CM | POA: Diagnosis not present

## 2020-04-14 DIAGNOSIS — Z515 Encounter for palliative care: Secondary | ICD-10-CM | POA: Diagnosis not present

## 2020-04-14 DIAGNOSIS — N2889 Other specified disorders of kidney and ureter: Secondary | ICD-10-CM | POA: Diagnosis not present

## 2020-04-14 DIAGNOSIS — J9811 Atelectasis: Secondary | ICD-10-CM | POA: Diagnosis not present

## 2020-04-14 DIAGNOSIS — R52 Pain, unspecified: Secondary | ICD-10-CM | POA: Diagnosis not present

## 2020-04-14 DIAGNOSIS — I9581 Postprocedural hypotension: Secondary | ICD-10-CM | POA: Diagnosis not present

## 2020-04-14 DIAGNOSIS — S22089A Unspecified fracture of T11-T12 vertebra, initial encounter for closed fracture: Secondary | ICD-10-CM | POA: Diagnosis not present

## 2020-04-14 DIAGNOSIS — N1330 Unspecified hydronephrosis: Secondary | ICD-10-CM | POA: Diagnosis not present

## 2020-04-14 DIAGNOSIS — Z8249 Family history of ischemic heart disease and other diseases of the circulatory system: Secondary | ICD-10-CM

## 2020-04-14 LAB — COMPREHENSIVE METABOLIC PANEL
ALT: 10 U/L (ref 0–44)
AST: 26 U/L (ref 15–41)
Albumin: 2.7 g/dL — ABNORMAL LOW (ref 3.5–5.0)
Alkaline Phosphatase: 61 U/L (ref 38–126)
Anion gap: 11 (ref 5–15)
BUN: 22 mg/dL (ref 8–23)
CO2: 21 mmol/L — ABNORMAL LOW (ref 22–32)
Calcium: 8.9 mg/dL (ref 8.9–10.3)
Chloride: 105 mmol/L (ref 98–111)
Creatinine, Ser: 2.09 mg/dL — ABNORMAL HIGH (ref 0.44–1.00)
GFR, Estimated: 23 mL/min — ABNORMAL LOW (ref >60)
Glucose, Bld: 205 mg/dL — ABNORMAL HIGH (ref 70–99)
Potassium: 4.9 mmol/L (ref 3.5–5.1)
Sodium: 137 mmol/L (ref 135–145)
Total Bilirubin: 1.2 mg/dL (ref 0.3–1.2)
Total Protein: 6.7 g/dL (ref 6.5–8.1)

## 2020-04-14 LAB — CBC WITH DIFFERENTIAL/PLATELET
Abs Immature Granulocytes: 0.34 10*3/uL — ABNORMAL HIGH (ref 0.00–0.07)
Basophils Absolute: 0.1 10*3/uL (ref 0.0–0.1)
Basophils Relative: 0 %
Eosinophils Absolute: 0 10*3/uL (ref 0.0–0.5)
Eosinophils Relative: 0 %
HCT: 30.5 % — ABNORMAL LOW (ref 36.0–46.0)
Hemoglobin: 10 g/dL — ABNORMAL LOW (ref 12.0–15.0)
Immature Granulocytes: 2 %
Lymphocytes Relative: 7 %
Lymphs Abs: 1.6 10*3/uL (ref 0.7–4.0)
MCH: 27.5 pg (ref 26.0–34.0)
MCHC: 32.8 g/dL (ref 30.0–36.0)
MCV: 83.8 fL (ref 80.0–100.0)
Monocytes Absolute: 0.8 10*3/uL (ref 0.1–1.0)
Monocytes Relative: 3 %
Neutro Abs: 20.7 10*3/uL — ABNORMAL HIGH (ref 1.7–7.7)
Neutrophils Relative %: 88 %
Platelets: 459 10*3/uL — ABNORMAL HIGH (ref 150–400)
RBC: 3.64 MIL/uL — ABNORMAL LOW (ref 3.87–5.11)
RDW: 15 % (ref 11.5–15.5)
WBC: 23.4 10*3/uL — ABNORMAL HIGH (ref 4.0–10.5)
nRBC: 0 % (ref 0.0–0.2)

## 2020-04-14 LAB — LACTIC ACID, PLASMA
Lactic Acid, Venous: 3.6 mmol/L (ref 0.5–1.9)
Lactic Acid, Venous: 5.1 mmol/L (ref 0.5–1.9)
Lactic Acid, Venous: 1.4 mmol/L (ref 0.5–1.9)

## 2020-04-14 LAB — RESP PANEL BY RT-PCR (FLU A&B, COVID) ARPGX2
Influenza A by PCR: NEGATIVE
Influenza B by PCR: NEGATIVE
SARS Coronavirus 2 by RT PCR: NEGATIVE

## 2020-04-14 LAB — HEMOGLOBIN AND HEMATOCRIT, BLOOD
HCT: 22.2 % — ABNORMAL LOW (ref 36.0–46.0)
Hemoglobin: 7.5 g/dL — ABNORMAL LOW (ref 12.0–15.0)

## 2020-04-14 LAB — TYPE AND SCREEN
ABO/RH(D): O POS
Antibody Screen: NEGATIVE

## 2020-04-14 LAB — POC OCCULT BLOOD, ED: Fecal Occult Bld: POSITIVE — AB

## 2020-04-14 MED ORDER — BOOST / RESOURCE BREEZE PO LIQD CUSTOM
1.0000 | Freq: Three times a day (TID) | ORAL | Status: DC
  Administered 2020-04-14 – 2020-04-19 (×10): 1 via ORAL

## 2020-04-14 MED ORDER — ACETAMINOPHEN 325 MG PO TABS: 650.0000 mg | ORAL_TABLET | Freq: Four times a day (QID) | ORAL | Status: DC | PRN

## 2020-04-14 MED ORDER — LACTATED RINGERS IV BOLUS
1000.0000 mL | Freq: Once | INTRAVENOUS | Status: AC
  Administered 2020-04-14: 12:00:00 1000 mL via INTRAVENOUS

## 2020-04-14 MED ORDER — IOHEXOL 9 MG/ML PO SOLN
ORAL | Status: AC
  Filled 2020-04-14: qty 1000

## 2020-04-14 MED ORDER — ONDANSETRON HCL 4 MG/2ML IJ SOLN
4.0000 mg | Freq: Once | INTRAMUSCULAR | Status: AC
  Administered 2020-04-14: 09:00:00 4 mg via INTRAVENOUS
  Filled 2020-04-14: qty 2

## 2020-04-14 MED ORDER — METOPROLOL SUCCINATE ER 50 MG PO TB24
50.0000 mg | ORAL_TABLET | Freq: Every day | ORAL | Status: DC
  Administered 2020-04-14: 14:00:00 50 mg via ORAL
  Filled 2020-04-14: qty 1

## 2020-04-14 MED ORDER — LACTATED RINGERS IV BOLUS
1000.0000 mL | Freq: Once | INTRAVENOUS | Status: AC
  Administered 2020-04-14: 09:00:00 1000 mL via INTRAVENOUS

## 2020-04-14 MED ORDER — HYDRALAZINE HCL 25 MG PO TABS
50.0000 mg | ORAL_TABLET | Freq: Two times a day (BID) | ORAL | Status: DC
  Administered 2020-04-14 (×2): 50 mg via ORAL
  Filled 2020-04-14 (×2): qty 2

## 2020-04-14 MED ORDER — ONDANSETRON HCL 4 MG/2ML IJ SOLN
4.0000 mg | Freq: Four times a day (QID) | INTRAMUSCULAR | Status: DC | PRN
  Administered 2020-04-17 – 2020-04-23 (×2): 4 mg via INTRAVENOUS
  Filled 2020-04-14 (×2): qty 2

## 2020-04-14 MED ORDER — IOHEXOL 9 MG/ML PO SOLN
1000.0000 mL | ORAL | Status: AC
  Administered 2020-04-14: 09:00:00 1000 mL via ORAL

## 2020-04-14 MED ORDER — HYDROCODONE-ACETAMINOPHEN 5-325 MG PO TABS
1.0000 | ORAL_TABLET | ORAL | Status: DC | PRN
  Administered 2020-04-15 – 2020-04-19 (×12): 2 via ORAL
  Administered 2020-04-20 (×2): 1 via ORAL
  Administered 2020-04-20 – 2020-04-28 (×4): 2 via ORAL
  Administered 2020-04-28: 1 via ORAL
  Filled 2020-04-14 (×2): qty 1
  Filled 2020-04-14 (×2): qty 2
  Filled 2020-04-14: qty 1
  Filled 2020-04-14 (×14): qty 2

## 2020-04-14 MED ORDER — ACETAMINOPHEN 650 MG RE SUPP
650.0000 mg | Freq: Four times a day (QID) | RECTAL | Status: DC | PRN
Start: 2020-04-14 — End: 2020-05-01

## 2020-04-14 MED ORDER — MORPHINE SULFATE (PF) 2 MG/ML IV SOLN
2.0000 mg | INTRAVENOUS | Status: DC | PRN
Start: 2020-04-14 — End: 2020-04-30
  Administered 2020-04-20 – 2020-04-29 (×13): 2 mg via INTRAVENOUS
  Filled 2020-04-14 (×13): qty 1

## 2020-04-14 MED ORDER — ONDANSETRON HCL 4 MG PO TABS: 4.0000 mg | ORAL_TABLET | Freq: Four times a day (QID) | ORAL | Status: DC | PRN

## 2020-04-14 MED ORDER — SODIUM CHLORIDE 0.9 % IV SOLN: INTRAVENOUS | Status: AC

## 2020-04-14 MED ORDER — PIPERACILLIN-TAZOBACTAM IN DEX 2-0.25 GM/50ML IV SOLN
2.2500 g | Freq: Three times a day (TID) | INTRAVENOUS | Status: DC
  Administered 2020-04-14 – 2020-04-30 (×49): 2.25 g via INTRAVENOUS
  Filled 2020-04-14 (×56): qty 50

## 2020-04-14 MED ORDER — ROSUVASTATIN CALCIUM 20 MG PO TABS
20.0000 mg | ORAL_TABLET | Freq: Every day | ORAL | Status: DC
  Administered 2020-04-14 – 2020-04-22 (×9): 20 mg via ORAL
  Filled 2020-04-14 (×9): qty 1

## 2020-04-14 MED ORDER — MORPHINE SULFATE (PF) 4 MG/ML IV SOLN
4.0000 mg | Freq: Once | INTRAVENOUS | Status: AC
  Administered 2020-04-14: 13:00:00 4 mg via INTRAVENOUS
  Filled 2020-04-14: qty 1

## 2020-04-14 MED ORDER — FENTANYL CITRATE (PF) 100 MCG/2ML IJ SOLN
25.0000 ug | Freq: Once | INTRAMUSCULAR | Status: AC
  Administered 2020-04-14: 09:00:00 25 ug via INTRAVENOUS
  Filled 2020-04-14: qty 2

## 2020-04-14 NOTE — ED Notes (Signed)
Report given to 3E RN

## 2020-04-14 NOTE — Progress Notes (Signed)
Dr. Neysa Bonito Paged regarding Critical lactic acid of 5.1

## 2020-04-14 NOTE — ED Notes (Signed)
Hourly rounding complete. Pt resting comfortably with bed in lowest position and side rails up x2. Vitals updated. All needs met at this time. Will continue to monitor. 

## 2020-04-14 NOTE — ED Triage Notes (Signed)
Pt BIBA from home.  Per EMS- Pt reports abdominal pain x2 weeks.  Seen by PCP, prescibed augmentin, finished yesterday.  Pain has not resolved, same as two weeks ago.  Pt reports bright red blood per rectum starting this AM.  Pt denies blood thinners.   AOx4, ambulatory.

## 2020-04-14 NOTE — Consult Note (Signed)
Referring Provider:  Dr. Maryan Rued, EDP Primary Care Physician:  Tower, Wynelle Fanny, MD Primary Gastroenterologist:  Dr. Olevia Perches  Reason for Consultation:  Rectal bleeding, diverticulitis  HPI: Martha Ortega is a 85 y.o. female with a history of CKD, prior CVA on aspirin, diverticulitis, hypertension and hyperlipidemia who presented to [facility] ED today with 2 weeks of intermittent left lower quadrant abdominal pain and bloody stool starting this morning.  Patient reports that last week she went and saw her PCP after having left lower quadrant crampy abdominal pain for approximately a week.  At that time she was given antibiotics (Augmentin) presumably for diverticulitis.  Patient reports she took her last antibiotic yesterday (12/20).  She does not really feel like it helped much.  Pain still comes in waves.  When she woke up this morning around 5 AM and used the bathroom she noticed her stool had dark blood in it.  This only occurred once but prompted her visit to the ED.  She denies feeling weak or dizzy.  Denies recent nausea or vomiting.  Says that overall her bowels had been moving well.  Other than aspirin she takes no other anticoagulation.  WBC count is 23.4K, Hgb 10 grams as compared to 12.1 grams just one week ago.  CT scan of the abdomen and pelvis without contrast showed the following:  IMPRESSION: 1. Diverticulitis of the distal descending colon complicated by sinus tract and 18 mm collection in the low pericolic gutter. 2. Inferior mesenteric adenopathy which is presumably reactive, attention on follow-up. 3. Aortic Atherosclerosis (ICD10-I70.0).   Last colonoscopy 02/2006 that showed tortuous colon in the area of the descending colon, high risk for perforation, colonoscopy incomplete.  Diverticulosis.    Follow-up BE 02/2006 showed the following:  IMPRESSION:  Marked narrowing of the sigmoid colon consistent with extensive diverticular disease and possibly diverticulitis.  Consider treatment with antibiotics and allow this process to subside and for the barium to evacuate. Then consider detailed CT scan. Also pending the findings of the CT, consider partial colonoscopy.   Past Medical History:  Diagnosis Date  . Abnormal blood findings    baseline cr 1.3, watching for renal insufficiency  . Allergic rhinitis   . History of diverticulitis of colon   . Hyperglycemia 09   mild  . Hyperlipidemia   . Hypertension   . Stroke (cerebrum) [facility])     Past Surgical History:  Procedure Laterality Date  . ABDOMINAL HYSTERECTOMY    . APPENDECTOMY     Patient states not sure if this is true  . barium swallow  10/18/03  . CARDIOVASCULAR STRESS TEST  07/21/04  . CYSTECTOMY  93   R axilla  . CYSTECTOMY  97   R breast    Prior to Admission medications   Medication Sig Start Date End Date Taking? Authorizing Provider  aspirin 81 MG tablet Take 81 mg by mouth daily.   Yes [provider]  Calcium Carbonate-Vit D-Min (CALCIUM 1200 PO) Take 1 tablet by mouth daily.   Yes [provider]  hydrALAZINE (APRESOLINE) 50 MG tablet Take 50 mg by mouth in the morning and at bedtime. 11/08/19  Yes [provider]  losartan (COZAAR) 50 MG tablet Take 50 mg by mouth daily. 02/11/20  Yes [provider]  metoprolol succinate (TOPROL-XL) 50 MG 24 hr tablet TAKE 1 TABLET DAILY WITH OR IMMEDIATELY FOLLOWING A MEAL 10/18/19  Yes Tower, Marne A, MD  rosuvastatin (CRESTOR) 20 MG tablet TAKE 1 TABLET  DAILY 12/05/19  Yes Tower, Wynelle Fanny, MD  amoxicillin-clavulanate (AUGMENTIN) 875-125 MG tablet Take 1 tablet by mouth 2 (two) times daily. Patient not taking: No sig reported 04/06/20   Venia Carbon, MD    Current Facility-Administered Medications  Medication Dose Route Frequency Provider Last Rate Last Admin  . iohexol (OMNIPAQUE) 9 MG/ML oral solution           . morphine 4 MG/ML injection 4 mg  4 mg Intravenous Once Plunkett, Whitney, MD      .  piperacillin-tazobactam (ZOSYN) IVPB 2.25 g  2.25 g Intravenous Q8H Blanchie Dessert, MD       Current Outpatient Medications  Medication Sig Dispense Refill  . aspirin 81 MG tablet Take 81 mg by mouth daily.    . Calcium Carbonate-Vit D-Min (CALCIUM 1200 PO) Take 1 tablet by mouth daily.    . hydrALAZINE (APRESOLINE) 50 MG tablet Take 50 mg by mouth in the morning and at bedtime.    Marland Kitchen losartan (COZAAR) 50 MG tablet Take 50 mg by mouth daily.    . metoprolol succinate (TOPROL-XL) 50 MG 24 hr tablet TAKE 1 TABLET DAILY WITH OR IMMEDIATELY FOLLOWING A MEAL 90 tablet 1  . rosuvastatin (CRESTOR) 20 MG tablet TAKE 1 TABLET DAILY 90 tablet 3  . amoxicillin-clavulanate (AUGMENTIN) 875-125 MG tablet Take 1 tablet by mouth 2 (two) times daily. (Patient not taking: No sig reported) 14 tablet 0    Allergies as of 04/10/2020 - Review Complete 04/21/2020  Allergen Reaction Noted  . Amlodipine Swelling 07/23/2012  . Fluticasone propionate  10/31/2007  . Loratadine  10/31/2007  . Lovastatin  09/08/2006  . Omeprazole Swelling 01/23/2012  . Zocor [simvastatin - high dose]  06/10/2011    Family History  Problem Relation Age of Onset  . Arthritis Mother   . Arthritis Sister   . Lupus Sister        ?  . Diabetes Sister   . Prostate cancer Brother   . Stroke Father   . Hypertension Father        ?    Social History   Socioeconomic History  . Marital status: Widowed    Spouse name: Not on file  . Number of children: Not on file  . Years of education: Not on file  . Highest education level: Not on file  Occupational History  . Occupation: unemployed  Tobacco Use  . Smoking status: Former Smoker    Quit date: 04/25/1978    Years since quitting: 42.0  . Smokeless tobacco: Never Used  Vaping Use  . Vaping Use: Never used  Substance and Sexual Activity  . Alcohol use: No    Alcohol/week: 0.0 standard drinks  . Drug use: No  . Sexual activity: Not Currently  Other Topics Concern  . Not  on file  Social History Narrative   Widowed, not working, son lives close by   Regular exercise: walking videos 3-4 times weekly            Social Determinants of Health   Financial Resource Strain: Not on file  Food Insecurity: Not on file  Transportation Needs: Not on file  Physical Activity: Not on file  Stress: Not on file  Social Connections: Not on file  Intimate Partner Violence: Not on file    Review of Systems: ROS is O/W negative except as mentioned in HPI.  Physical Exam: Vital signs in last 24 hours: Temp:  [97.6 F (36.4 C)] 97.6 F (36.4 C) (  12/21 0825) Pulse Rate:  [75-102] 80 (12/21 1130) Resp:  [17-23] 18 (12/21 1130) BP: (118-131)/(76-82) 119/82 (12/21 1130) SpO2:  [97 %-100 %] 100 % (12/21 1130)   General:  Alert, Well-developed, well-nourished, pleasant and cooperative in NAD Head:  Normocephalic and atraumatic. Eyes:  Sclera clear, no icterus.  Conjunctiva pink. Ears:  Normal auditory acuity. Mouth:  No deformity or lesions.   Lungs:  Clear throughout to auscultation.  No wheezes, crackles, or rhonchi.  Heart:  Regular rate and rhythm; no murmurs, clicks, rubs, or gallops. Abdomen:  Soft, non-distended.  BS present.  LLQ TTP.  No rebound or guarding.  Msk:  Symmetrical without gross deformities. Pulses:  Normal pulses noted. Extremities:  Without clubbing or edema. Neurologic:  Alert and oriented x 4;  grossly normal neurologically. Skin:  Intact without significant lesions or rashes. Psych:  Alert and cooperative. Normal mood and affect.  Lab Results: Recent Labs    04/05/2020 0910  WBC 23.4*  HGB 10.0*  HCT 30.5*  PLT 459*   BMET Recent Labs    04/06/2020 0910  NA 137  K 4.9  CL 105  CO2 21*  GLUCOSE 205*  BUN 22  CREATININE 2.09*  CALCIUM 8.9   LFT Recent Labs    04/21/2020 0910  PROT 6.7  ALBUMIN 2.7*  AST 26  ALT 10  ALKPHOS 61  BILITOT 1.2   Studies/Results: CT ABDOMEN PELVIS WO CONTRAST  Result Date:  03/30/2020 CLINICAL DATA:  Suspected diverticulitis. Abdominal pain unresolved by antibiotics EXAM: CT ABDOMEN AND PELVIS WITHOUT CONTRAST TECHNIQUE: Multidetector CT imaging of the abdomen and pelvis was performed following the standard protocol without IV contrast. COMPARISON:  08/16/2016 FINDINGS: Lower chest: Mild right lower lobe scarring related to thoracic osteophytes. Possible minimal sliding hiatal hernia Hepatobiliary: Simple cystic densities in the liver measuring up to 2.5 cm left of the hepatic cava.No evidence of biliary obstruction or stone. Pancreas: Unremarkable. Spleen: Unremarkable. Adrenals/Urinary Tract: Negative adrenals. No hydronephrosis or stone. Lobulated kidneys which could be developmental or from scarring. Stable cystic density emanating from the interpolar left kidney. Unremarkable bladder. Stomach/Bowel: Innumerable colonic diverticula with inflammation around the distal descending diverticulum that is further complicated by a track like partially gas-filled structure which extends superiorly and laterally along the gutter and leads to a 18 mm gas-filled collection lateral to the colon, see coronal reformats. A sinus tract may resume further superior from this dominant gas collection. The colon is diffusely filled with fluid; no generalized colonic wall thickening. Oral contrast reaches the proximal colon without extravasation. No small bowel obstruction. Vascular/Lymphatic: Multifocal atheromatous calcification of the aorta and iliacs. Mild mesenteric adenopathy in the IMV distribution, presumably reactive Reproductive:Hysterectomy. Other: No ascites or pneumoperitoneum. Musculoskeletal: Generalized lumbar spine degeneration with scoliosis. T12 remote inferior endplate fracture. IMPRESSION: 1. Diverticulitis of the distal descending colon complicated by sinus tract and 18 mm collection in the low pericolic gutter. 2. Inferior mesenteric adenopathy which is presumably reactive,  attention on follow-up. 3. Aortic Atherosclerosis (ICD10-I70.0). Electronically Signed   By: Monte Fantasia M.D.   On: 03/30/2020 11:31   IMPRESSION:  *85 year old female with diverticulitis on CT scan with a sinus tract and 18 mm fluid collection.  WBC count 23.4K, afebrile.  Now with rectal bleeding x 1 this morning.  PLAN: -Agree with Zosyn, pain control, IV hydration. -Will allow clear liquids. -Monitor labs.   Laban Emperor. Madelynne Lasker  03/30/2020, 12:00 PM

## 2020-04-14 NOTE — ED Provider Notes (Signed)
[facility] DEPT Provider Note   CSN: [id number] Arrival date & time: 03/28/2020  0800     History Chief Complaint  Patient presents with   Blood In Stools   Abdominal Pain    Martha Ortega is a 85 y.o. female.  85 year old female with a history of CKD, prior CVA on aspirin, diverticulitis, hypertension and hyperlipidemia who is presenting today with 2 weeks of intermittent left lower quadrant abdominal pain and bloody stool starting this morning.  Patient reports that last week she went and saw her PCP after having left lower quadrant crampy abdominal pain for approximately a week.  At that time she was given antibiotics presumably for diverticulitis.  Patient reports she took her last antibiotic yesterday and felt like the pain was getting better but it still comes in waves.  When she woke up this morning around 5 AM and used the bathroom she noticed her stool had dark blood in it.  She denies feeling weak or dizzy.  She has not been short of breath with exertion.  She reports right now her stomach only hurts if you push on it.  Other than aspirin she takes no other anticoagulation.  The history is provided by the patient.  Abdominal Pain Pain location:  LLQ Pain quality: aching, cramping and gnawing   Pain radiates to:  Does not radiate Pain severity:  Moderate Onset quality:  Gradual Duration:  2 weeks Timing:  Intermittent Progression:  Waxing and waning Chronicity:  New Relieved by:  Nothing Worsened by:  Movement and palpation Ineffective treatments: antibiotics. Associated symptoms: anorexia and nausea   Associated symptoms: no diarrhea and no vomiting   Risk factors: being elderly   Risk factors comment:  Hx of diverticulitis      Past Medical History:  Diagnosis Date   Abnormal blood findings    baseline cr 1.3, watching for renal insufficiency   Allergic rhinitis    History of diverticulitis of colon    Hyperglycemia 09    mild   Hyperlipidemia    Hypertension    Stroke (cerebrum) [facility])     Patient Active Problem List   Diagnosis Date Noted   Abdominal pain 04/06/2020   CKD (chronic kidney disease), stage IV ([place]) 04/06/2020   Mild protein-calorie malnutrition ([place]) 04/06/2020   Osteopenia 07/26/2015   Estrogen deficiency 06/23/2015   History of CVA (cerebrovascular accident) 03/02/2015   Encounter for Medicare annual wellness exam 09/23/2013   Spinal stenosis of lumbar region 09/23/2013   GERD (gastroesophageal reflux disease) 12/12/2011   Renal insufficiency 11/26/2008   ALLERGIC RHINITIS 09/10/2007   Hyperlipidemia 09/08/2006   Essential hypertension 09/08/2006   POSTNASAL DRIP SYNDROME 09/08/2006   DIVERTICULITIS, HX OF 09/08/2006    Past Surgical History:  Procedure Laterality Date   ABDOMINAL HYSTERECTOMY     APPENDECTOMY     Patient states not sure if this is true   barium swallow  10/18/03   CARDIOVASCULAR STRESS TEST  07/21/04   CYSTECTOMY  93   R axilla   CYSTECTOMY  97   R breast     OB History   No obstetric history on file.     Family History  Problem Relation Age of Onset   Arthritis Mother    Arthritis Sister    Lupus Sister        ?   Diabetes Sister    Prostate cancer Brother    Stroke Father    Hypertension Father        ?  Social History   Tobacco Use   Smoking status: Former Smoker    Quit date: 04/25/1978    Years since quitting: 42.0   Smokeless tobacco: Never Used  Vaping Use   Vaping Use: Never used  Substance Use Topics   Alcohol use: No    Alcohol/week: 0.0 standard drinks   Drug use: No    Home Medications Prior to Admission medications   Medication Sig Start Date End Date Taking? Authorizing Provider  amoxicillin-clavulanate (AUGMENTIN) 875-125 MG tablet Take 1 tablet by mouth 2 (two) times daily. 04/06/20   Venia Carbon, MD  aspirin 81 MG tablet Take 81 mg by mouth daily.    [provider]  Calcium Carbonate-Vit D-Min (CALCIUM 1200 PO) Take by mouth.    [provider]  hydrALAZINE (APRESOLINE) 25 MG tablet Take 25 mg by mouth 3 (three) times daily.     [provider]  ketotifen (ZADITOR) 0.025 % ophthalmic solution Apply to eye.    [provider]  losartan-hydrochlorothiazide (HYZAAR) 100-25 MG tablet Take 1 tablet by mouth daily.    [provider]  metoprolol succinate (TOPROL-XL) 50 MG 24 hr tablet TAKE 1 TABLET DAILY WITH OR IMMEDIATELY FOLLOWING A MEAL 10/18/19   Tower, Wynelle Fanny, MD  rosuvastatin (CRESTOR) 20 MG tablet TAKE 1 TABLET DAILY 12/05/19   Tower, Wynelle Fanny, MD    Allergies    Amlodipine, Fluticasone propionate, Loratadine, Lovastatin, Omeprazole, and Zocor [simvastatin - high dose]  Review of Systems   Review of Systems  Gastrointestinal: Positive for abdominal pain, anorexia and nausea. Negative for diarrhea and vomiting.  All other systems reviewed and are negative.   Physical Exam Updated Vital Signs BP 130/76 (BP Location: Right Arm)    Pulse (!) 102    Temp 97.6 F (36.4 C) (Oral)    Resp (!) 23    SpO2 100%   Physical Exam Vitals and nursing note reviewed.  Constitutional:      General: She is not in acute distress.    Appearance: She is well-developed, normal weight and well-nourished.  HENT:     Head: Normocephalic and atraumatic.  Eyes:     Extraocular Movements: Extraocular movements intact and EOM normal.     Pupils: Pupils are equal, round, and reactive to light.  Cardiovascular:     Rate and Rhythm: Regular rhythm. Tachycardia present.     Pulses: Intact distal pulses.     Heart sounds: Normal heart sounds. No murmur heard. No friction rub.  Pulmonary:     Effort: Pulmonary effort is normal.     Breath sounds: Normal breath sounds. No wheezing or rales.  Abdominal:     General: Bowel sounds are normal. There is no distension.     Palpations: Abdomen is soft.     Tenderness: There is  abdominal tenderness in the left upper quadrant and left lower quadrant. There is guarding. There is no rebound.  Genitourinary:    Rectum: Guaiac result positive.     Comments: Dark red blood noted at the rectum Musculoskeletal:        General: No tenderness. Normal range of motion.     Comments: No edema  Skin:    General: Skin is warm and dry.     Findings: No rash.  Neurological:     Mental Status: She is alert and oriented to person, place, and time.     Cranial Nerves: No cranial nerve deficit.  Psychiatric:  Mood and Affect: Mood and affect normal.        Behavior: Behavior normal.     ED Results / Procedures / Treatments   Labs (all labs ordered are listed, but only abnormal results are displayed) Labs Reviewed  CBC WITH DIFFERENTIAL/PLATELET - Abnormal; Notable for the following components:      Result Value   WBC 23.4 (*)    RBC 3.64 (*)    Hemoglobin 10.0 (*)    HCT 30.5 (*)    Platelets 459 (*)    Neutro Abs 20.7 (*)    Abs Immature Granulocytes 0.34 (*)    All other components within normal limits  COMPREHENSIVE METABOLIC PANEL - Abnormal; Notable for the following components:   CO2 21 (*)    Glucose, Bld 205 (*)    Creatinine, Ser 2.09 (*)    Albumin 2.7 (*)    GFR, Estimated 23 (*)    All other components within normal limits  LACTIC ACID, PLASMA - Abnormal; Notable for the following components:   Lactic Acid, Venous 3.6 (*)    All other components within normal limits  POC OCCULT BLOOD, ED - Abnormal; Notable for the following components:   Fecal Occult Bld POSITIVE (*)    All other components within normal limits  RESP PANEL BY RT-PCR (FLU A&B, COVID) [id number]  TYPE AND SCREEN    EKG None  Radiology CT ABDOMEN PELVIS WO CONTRAST  Result Date: 04/05/2020 CLINICAL DATA:  Suspected diverticulitis. Abdominal pain unresolved by antibiotics EXAM: CT ABDOMEN AND PELVIS WITHOUT CONTRAST TECHNIQUE: Multidetector CT imaging of the abdomen and pelvis  was performed following the standard protocol without IV contrast. COMPARISON:  08/16/2016 FINDINGS: Lower chest: Mild right lower lobe scarring related to thoracic osteophytes. Possible minimal sliding hiatal hernia Hepatobiliary: Simple cystic densities in the liver measuring up to 2.5 cm left of the hepatic cava.No evidence of biliary obstruction or stone. Pancreas: Unremarkable. Spleen: Unremarkable. Adrenals/Urinary Tract: Negative adrenals. No hydronephrosis or stone. Lobulated kidneys which could be developmental or from scarring. Stable cystic density emanating from the interpolar left kidney. Unremarkable bladder. Stomach/Bowel: Innumerable colonic diverticula with inflammation around the distal descending diverticulum that is further complicated by a track like partially gas-filled structure which extends superiorly and laterally along the gutter and leads to a 18 mm gas-filled collection lateral to the colon, see coronal reformats. A sinus tract may resume further superior from this dominant gas collection. The colon is diffusely filled with fluid; no generalized colonic wall thickening. Oral contrast reaches the proximal colon without extravasation. No small bowel obstruction. Vascular/Lymphatic: Multifocal atheromatous calcification of the aorta and iliacs. Mild mesenteric adenopathy in the IMV distribution, presumably reactive Reproductive:Hysterectomy. Other: No ascites or pneumoperitoneum. Musculoskeletal: Generalized lumbar spine degeneration with scoliosis. T12 remote inferior endplate fracture. IMPRESSION: 1. Diverticulitis of the distal descending colon complicated by sinus tract and 18 mm collection in the low pericolic gutter. 2. Inferior mesenteric adenopathy which is presumably reactive, attention on follow-up. 3. Aortic Atherosclerosis (ICD10-I70.0). Electronically Signed   By: Monte Fantasia M.D.   On: 04/10/2020 11:31    Procedures Procedures (including critical care  time)  Medications Ordered in ED Medications  fentaNYL (SUBLIMAZE) injection 25 mcg (has no administration in time range)  ondansetron (ZOFRAN) injection 4 mg (has no administration in time range)  lactated ringers bolus 1,000 mL (has no administration in time range)    ED Course  I have reviewed the triage vital signs and the nursing notes.  Pertinent labs &  imaging results that were available during my care of the patient were reviewed by me and considered in my medical decision making (see chart for details).    MDM Rules/Calculators/A&P                          Elderly female presenting today with abdominal pain in the left lower quadrant most concerning for diverticulitis but now also rectal bleeding.  The blood is dark red in nature and suspect a lower GI bleed.  Patient takes aspirin but no other anticoagulation.  She is currently hemodynamically stable but mildly tachycardic.  She was given fluids and pain control.  CT, labs and type and screen pending.  Patient has seen [facility] in the past.  10:10 AM CBC significant for leukocytosis of 23,000 today and stable hemoglobin of 10, CMP with creatinine of 2.09 which is unchanged from prior.  Lactic acid and CT are still pending.  12:13 PM Pt lactic acid is 3.6 andshe was given an additional bolus of fluids.  CT showed diveriticulitis with sinus tract and fluid collection and pt started on zosyn.  Spoke with [facility] who will see the pt and will admit for further care.  MDM Number of Diagnoses or Management Options   Amount and/or Complexity of Data Reviewed Clinical lab tests: ordered and reviewed Tests in the radiology section of CPT: ordered and reviewed Tests in the medicine section of CPT: ordered and reviewed Decide to obtain previous medical records or to obtain history from someone other than the patient: yes Obtain history from someone other than the patient: yes Review and summarize past medical records:  yes Discuss the patient with other providers: yes Independent visualization of images, tracings, or specimens: yes  Risk of Complications, Morbidity, and/or Mortality Presenting problems: high Diagnostic procedures: moderate Management options: moderate  Patient Progress Patient progress: stable    Final Clinical Impression(s) / ED Diagnoses Final diagnoses:  Blood in stool  Diverticulitis of large intestine with perforation with bleeding    Rx / DC Orders ED Discharge Orders    None       Blanchie Dessert, MD 04/16/2020 1219

## 2020-04-14 NOTE — H&P (Addendum)
History and Physical        Hospital Admission Note Date: 04/18/2020  Patient name: Martha Ortega Medical record number: [id number] Date of birth: 11-May-1934 Age: 85 y.o. Gender: female  PCP: Tower, Wynelle Fanny, MD  Patient coming from: Home   Chief Complaint    Chief Complaint  Patient presents with  . Blood In Stools  . Abdominal Pain      HPI:   This is an 85 year old female with past medical history of CVA on aspirin, CKD 4, hypertension, hyperlipidemia, diverticulitis, s/p appendectomy and hysterectomy who presented to the ED with 2 weeks of intermittent LLQ pain and blood in stool since this morning.  She was seen by her PCP on 12/13 for LLQ pain and was given Augmentin for presumed diverticulitis which she completed yesterday.  Antibiotic did not really help much.  This morning around 5 AM she had dark blood in her stool and had another bloody BM in the ED.   ED Course: Afebrile, tachycardic, tachypneic,  hemodynamically stable, on room air. Notable Labs: Glucose 205, BUN 22, creatinine 2.09, lactic acid 3.6, WBC 23, Hb 10.0 (was 12.1 on 12/13) influenza and flu negative, FOBT positive. Notable Imaging: CT abdomen pelvis without contrast-diverticulitis of the distal descending colon complicated by a sinus tract and 18 mm collection in the low pericolic gutter with inferior mesenteric adenopathy presumably reactive. Patient received morphine, Zofran, 2 L LR bolus and Zosyn.  GI consulted in the ED.    Vitals:   04/03/2020 1219 04/07/2020 1330  BP: (!) 146/49 128/66  Pulse: 84 92  Resp: 18 18  Temp:  98.1 F (36.7 C)  SpO2: 98% 100%     Review of Systems:  Review of Systems  Constitutional: Negative for fever.  All other systems reviewed and are negative.   Medical/Social/Family History   Past Medical History: Past Medical History:  Diagnosis Date  . Abnormal  blood findings    baseline cr 1.3, watching for renal insufficiency  . Allergic rhinitis   . History of diverticulitis of colon   . Hyperglycemia 09   mild  . Hyperlipidemia   . Hypertension   . Stroke (cerebrum) [facility])     Past Surgical History:  Procedure Laterality Date  . ABDOMINAL HYSTERECTOMY    . APPENDECTOMY     Patient states not sure if this is true  . barium swallow  10/18/03  . CARDIOVASCULAR STRESS TEST  07/21/04  . CYSTECTOMY  93   R axilla  . CYSTECTOMY  97   R breast    Medications: Prior to Admission medications   Medication Sig Start Date End Date Taking? Authorizing Provider  aspirin 81 MG tablet Take 81 mg by mouth daily.   Yes [provider]  Calcium Carbonate-Vit D-Min (CALCIUM 1200 PO) Take 1 tablet by mouth daily.   Yes [provider]  hydrALAZINE (APRESOLINE) 50 MG tablet Take 50 mg by mouth in the morning and at bedtime. 11/08/19  Yes [provider]  losartan (COZAAR) 50 MG tablet Take 50 mg by mouth daily. 02/11/20  Yes [provider]  metoprolol succinate (TOPROL-XL) 50 MG 24 hr tablet TAKE 1 TABLET DAILY WITH OR IMMEDIATELY FOLLOWING  A MEAL 10/18/19  Yes Tower, Wynelle Fanny, MD  rosuvastatin (CRESTOR) 20 MG tablet TAKE 1 TABLET DAILY 12/05/19  Yes Tower, Wynelle Fanny, MD  amoxicillin-clavulanate (AUGMENTIN) 875-125 MG tablet Take 1 tablet by mouth 2 (two) times daily. Patient not taking: No sig reported 04/06/20   Venia Carbon, MD    Allergies:   Allergies  Allergen Reactions  . Amlodipine Swelling    Throat and mouth swelling  . Fluticasone Propionate     REACTION: does not help  . Loratadine     REACTION: does not work  . Lovastatin     REACTION: was not effective  . Omeprazole Swelling    Throat, lips, and mouth swelling  . Zocor [Simvastatin - High Dose]     Mouth and throat swelled    Social History:  reports that she quit smoking about 42 years ago. She has never used smokeless tobacco. She reports  that she does not drink alcohol and does not use drugs.  Family History: Family History  Problem Relation Age of Onset  . Arthritis Mother   . Arthritis Sister   . Lupus Sister        ?  . Diabetes Sister   . Prostate cancer Brother   . Stroke Father   . Hypertension Father        ?     Objective   Physical Exam: Blood pressure 128/66, pulse 92, temperature 98.1 F (36.7 C), temperature source Oral, resp. rate 18, height 5\' 2"  (1.575 m), weight 59.9 kg, SpO2 100 %.  Physical Exam Vitals and nursing note reviewed.  Constitutional:      Appearance: Normal appearance.  HENT:     Head: Normocephalic and atraumatic.  Eyes:     Conjunctiva/sclera: Conjunctivae normal.  Cardiovascular:     Rate and Rhythm: Normal rate and regular rhythm.  Pulmonary:     Effort: Pulmonary effort is normal.     Breath sounds: Normal breath sounds.  Abdominal:     General: Abdomen is flat.     Palpations: Abdomen is soft.     Tenderness: There is generalized abdominal tenderness and tenderness in the left lower quadrant.  Musculoskeletal:        General: No swelling or tenderness.  Skin:    Coloration: Skin is not jaundiced or pale.  Neurological:     Mental Status: She is alert. Mental status is at baseline.  Psychiatric:        Mood and Affect: Mood normal.        Behavior: Behavior normal.     LABS on Admission: I have personally reviewed all the labs and imaging below    Basic Metabolic Panel: Recent Labs  Lab 04/16/2020 0910  NA 137  K 4.9  CL 105  CO2 21*  GLUCOSE 205*  BUN 22  CREATININE 2.09*  CALCIUM 8.9   Liver Function Tests: Recent Labs  Lab 04/08/2020 0910  AST 26  ALT 10  ALKPHOS 61  BILITOT 1.2  PROT 6.7  ALBUMIN 2.7*   No results for input(s): LIPASE, AMYLASE in the last 168 hours. No results for input(s): AMMONIA in the last 168 hours. CBC: Recent Labs  Lab 03/25/2020 0910  WBC 23.4*  NEUTROABS 20.7*  HGB 10.0*  HCT 30.5*  MCV 83.8  PLT 459*    Cardiac Enzymes: No results for input(s): CKTOTAL, CKMB, CKMBINDEX, TROPONINI in the last 168 hours. BNP: Invalid input(s): POCBNP CBG: No results for input(s): GLUCAP in the  last 168 hours.  Radiological Exams on Admission:  CT ABDOMEN PELVIS WO CONTRAST  Result Date: 04/07/2020 CLINICAL DATA:  Suspected diverticulitis. Abdominal pain unresolved by antibiotics EXAM: CT ABDOMEN AND PELVIS WITHOUT CONTRAST TECHNIQUE: Multidetector CT imaging of the abdomen and pelvis was performed following the standard protocol without IV contrast. COMPARISON:  08/16/2016 FINDINGS: Lower chest: Mild right lower lobe scarring related to thoracic osteophytes. Possible minimal sliding hiatal hernia Hepatobiliary: Simple cystic densities in the liver measuring up to 2.5 cm left of the hepatic cava.No evidence of biliary obstruction or stone. Pancreas: Unremarkable. Spleen: Unremarkable. Adrenals/Urinary Tract: Negative adrenals. No hydronephrosis or stone. Lobulated kidneys which could be developmental or from scarring. Stable cystic density emanating from the interpolar left kidney. Unremarkable bladder. Stomach/Bowel: Innumerable colonic diverticula with inflammation around the distal descending diverticulum that is further complicated by a track like partially gas-filled structure which extends superiorly and laterally along the gutter and leads to a 18 mm gas-filled collection lateral to the colon, see coronal reformats. A sinus tract may resume further superior from this dominant gas collection. The colon is diffusely filled with fluid; no generalized colonic wall thickening. Oral contrast reaches the proximal colon without extravasation. No small bowel obstruction. Vascular/Lymphatic: Multifocal atheromatous calcification of the aorta and iliacs. Mild mesenteric adenopathy in the IMV distribution, presumably reactive Reproductive:Hysterectomy. Other: No ascites or pneumoperitoneum. Musculoskeletal: Generalized  lumbar spine degeneration with scoliosis. T12 remote inferior endplate fracture. IMPRESSION: 1. Diverticulitis of the distal descending colon complicated by sinus tract and 18 mm collection in the low pericolic gutter. 2. Inferior mesenteric adenopathy which is presumably reactive, attention on follow-up. 3. Aortic Atherosclerosis (ICD10-I70.0). Electronically Signed   By: Monte Fantasia M.D.   On: 04/16/2020 11:31      EKG: Not done   A & P   Principal Problem:   Diverticulitis Active Problems:   Hyperlipidemia   CKD (chronic kidney disease), stage IV (HCC)   Aortic atherosclerosis (HCC)   Blood in stool   ABLA (acute blood loss anemia)   1. Diverticulitis complicated by sinus tract and 18 mm collection in the low paracolic gutter a. Afebrile and hemodynamically stable on room air with leukocytosis b. Lactic acid 3.6-> 5.1-> 1.4.  Probably elevated from LR as it resolved quickly with NS c. GI on board: continue Zosyn, Continue with IV hydration and pain control.  Clear liquid diet  2. GI bleed a. Likely diverticular b. Hold aspirin c. Appreciate GI recommendations  3. Acute blood loss anemia secondary to above a. Hb dropped from 12.1-> 10.0 (from 12/13-12/21) b. Trend H&H c. Transfuse for Hb  <7.0.  Patient is okay with transfusion if needed  4. Hypertension a. Continue home hydralazine and Toprol-XL  5. Hyperlipidemia  Aortic Atherosclerosis a. Continue home statin  6. CKD 4, stable     DVT prophylaxis: SCDs   Code Status: Full Code  Diet: Clear liquid diet Family Communication: Admission, patients condition and plan of care including tests being ordered have  Disposition Plan: The appropriate patient status for this patient is INPATIENT. Inpatient status is judged to be reasonable and necessary in order to provide the required intensity of service to ensure the patient's safety. The patient's presenting symptoms, physical exam findings, and initial radiographic  and laboratory data in the context of their chronic comorbidities is felt to place them at high risk for further clinical deterioration. Furthermore, it is not anticipated that the patient will be medically stable for discharge from the hospital within 2 midnights  of admission. The following factors support the patient status of inpatient.   " The patient's presenting symptoms include abdominal pain and bloody bowel movement. " The worrisome physical exam findings include abdominal pain. " The initial radiographic and laboratory data are worrisome because of diverticulitis with sinus tract and positive FOBT and leukocytosis. " The chronic co-morbidities include hypertension, hyperlipidemia.   * I certify that at the point of admission it is my clinical judgment that the patient will require inpatient hospital care spanning beyond 2 midnights from the point of admission due to high intensity of service, high risk for further deterioration and high frequency of surveillance required.*   Status is: Inpatient  Remains inpatient appropriate because:IV treatments appropriate due to intensity of illness or inability to take PO and Inpatient level of care appropriate due to severity of illness   Dispo: The patient is from: Home              Anticipated d/c is to: Home              Anticipated d/c date is: 3 days              Patient currently is not medically stable to d/c.      Consultants  . GI  Procedures  . None  Time Spent on Admission: 60 minutes    Harold Hedge, DO [facility]  04/13/2020, 6:04 PM

## 2020-04-15 DIAGNOSIS — K5792 Diverticulitis of intestine, part unspecified, without perforation or abscess without bleeding: Secondary | ICD-10-CM | POA: Diagnosis not present

## 2020-04-15 LAB — CBC
HCT: 21.3 % — ABNORMAL LOW (ref 36.0–46.0)
Hemoglobin: 7.1 g/dL — ABNORMAL LOW (ref 12.0–15.0)
MCH: 27.4 pg (ref 26.0–34.0)
MCHC: 33.3 g/dL (ref 30.0–36.0)
MCV: 82.2 fL (ref 80.0–100.0)
Platelets: 288 10*3/uL (ref 150–400)
RBC: 2.59 MIL/uL — ABNORMAL LOW (ref 3.87–5.11)
RDW: 14.7 % (ref 11.5–15.5)
WBC: 19.1 10*3/uL — ABNORMAL HIGH (ref 4.0–10.5)
nRBC: 0 % (ref 0.0–0.2)

## 2020-04-15 LAB — BASIC METABOLIC PANEL
Anion gap: 7 (ref 5–15)
BUN: 25 mg/dL — ABNORMAL HIGH (ref 8–23)
CO2: 22 mmol/L (ref 22–32)
Calcium: 7.9 mg/dL — ABNORMAL LOW (ref 8.9–10.3)
Chloride: 105 mmol/L (ref 98–111)
Creatinine, Ser: 1.81 mg/dL — ABNORMAL HIGH (ref 0.44–1.00)
GFR, Estimated: 27 mL/min — ABNORMAL LOW (ref >60)
Glucose, Bld: 178 mg/dL — ABNORMAL HIGH (ref 70–99)
Potassium: 4.4 mmol/L (ref 3.5–5.1)
Sodium: 134 mmol/L — ABNORMAL LOW (ref 135–145)

## 2020-04-15 LAB — ABO/RH: ABO/RH(D): O POS

## 2020-04-15 MED ORDER — METOPROLOL SUCCINATE ER 25 MG PO TB24
12.5000 mg | ORAL_TABLET | Freq: Every day | ORAL | Status: DC
  Administered 2020-04-16 – 2020-04-19 (×4): 12.5 mg via ORAL
  Filled 2020-04-15 (×4): qty 1

## 2020-04-15 NOTE — Progress Notes (Signed)
PROGRESS NOTE    Martha Ortega  JKD:[id number] DOB: 1934-08-08 DOA: 04/01/2020 PCP: [name], MD    Chief Complaint  Patient presents with  . Blood In Stools  . Abdominal Pain    Brief Narrative:  This is an 85 year old female with past medical history of CVA on aspirin, CKD 4, hypertension, hyperlipidemia, diverticulitis, s/p appendectomy and hysterectomy who presented to the ED with 2 weeks of intermittent LLQ pain and blood in stool since this morning.  She was seen by her PCP on 12/13 for LLQ pain and was given Augmentin for presumed diverticulitis which she completed yesterday.  Antibiotic did not really help much.  This morning around 5 AM she had dark blood in her stool and had another bloody BM in the ED.   Subjective:   Reports liquid bloody stool at home, no bm since in the hospital, ab pain is less frequent No n/v No fever  Assessment & Plan:   Principal Problem:   Diverticulitis Active Problems:   Hyperlipidemia   CKD (chronic kidney disease), stage IV (HCC)   Aortic atherosclerosis (HCC)   Blood in stool   ABLA (acute blood loss anemia)  Recurrent diveticulitis complicated by sinus tract and 80 mL collection in her low paracolic gutter/GI bleed -Lactic acid normalized with hydration and antibiotics, she reported having less pain -GI consulted and recommended the following: " I would not recommend colonoscopy at this time in regards to her bleeding especially if this is self limited / one time episode in the setting of diverticulitis. In fact, colonoscopy in general sounds like it may not be possible at all, patient was told previously she should never have another one due to risks. If she resolves this with course of IV antibiotics, could consider outpatient virtual colonoscopy to re-evaluate the area and ensure no polyps / mass lesion in the area of concern. " "If she has recurrent or significant bleeding please contact us, would consider tagged RBC  scan in this setting (CKD).  "  Acute blood loss anemia -Transfuse if hemoglobin less than 7 -Repeat CBC in the morning  History of hypertension, currently blood pressure low normal in the setting of GI bleed and diverticulitis Discontinue hydralazine, decrease metoprolol with holding parameters  History of aortic atherosclerosis Hold aspirin in the setting of GI bleed/acute anemia Continue statin  CKD 4, appears at baseline, renal dosing meds    DVT prophylaxis: SCDs Start: 03/27/2020 1309   Code Status:full Family Communication: declined my offer to update her son, reports son has already been by this am Disposition:   Status is: Inpatient   Dispo: The patient is from: home              Anticipated d/c is to: home              Anticipated d/c date is: TBD              Patient currently not medical stable to discharge  Consultants:   GI  Procedures:   None  Antimicrobials:    Zosyn    Objective: Vitals:   04/12/2020 1333 04/24/2020 2152 04/15/20 0200 04/15/20 0555  BP:  121/68 105/65 102/64  Pulse:  82 79 82  Resp:  16 18 16   Temp:  99.3 F (37.4 C) 98.8 F (37.1 C) 98.8 F (37.1 C)  TempSrc:  Oral Oral   SpO2:  100% 99% 98%  Weight: 59.9 kg     Height: 5\' 2"  (1.575  m)       Intake/Output Summary (Last 24 hours) at 04/15/2020 1056 Last data filed at 04/15/2020 0959 Gross per 24 hour  Intake 3125.95 ml  Output 200 ml  Net 2925.95 ml   Filed Weights   04/08/2020 1333  Weight: 59.9 kg    Examination:  General exam: calm, NAD Respiratory system: Clear to auscultation. Respiratory effort normal. Cardiovascular system: S1 & S2 heard, RRR. No JVD, no murmur, No pedal edema. Gastrointestinal system: Abdomen is nondistended, soft , left lower quadrant tenderness. Normal bowel sounds heard. Central nervous system: Alert and oriented. No focal neurological deficits. Extremities: Symmetric 5 x 5 power. Skin: No rashes, lesions or ulcers Psychiatry:  Judgement and insight appear normal. Mood & affect appropriate.     Data Reviewed: I have personally reviewed following labs and imaging studies  CBC: Recent Labs  Lab 04/12/2020 0910 04/21/2020 1916 04/15/20 0037  WBC 23.4*  --  19.1*  NEUTROABS 20.7*  --   --   HGB 10.0* 7.5* 7.1*  HCT 30.5* 22.2* 21.3*  MCV 83.8  --  82.2  PLT 459*  --  846    Basic Metabolic Panel: Recent Labs  Lab 04/01/2020 0910 04/15/20 0037  NA 137 134*  K 4.9 4.4  CL 105 105  CO2 21* 22  GLUCOSE 205* 178*  BUN 22 25*  CREATININE 2.09* 1.81*  CALCIUM 8.9 7.9*    GFR: Estimated Creatinine Clearance: 18.6 mL/min (A) (by C-G formula based on SCr of 1.81 mg/dL (H)).  Liver Function Tests: Recent Labs  Lab 04/21/2020 0910  AST 26  ALT 10  ALKPHOS 61  BILITOT 1.2  PROT 6.7  ALBUMIN 2.7*    CBG: No results for input(s): GLUCAP in the last 168 hours.   Recent Results (from the past 240 hour(s))  Resp Panel by RT-PCR (Flu A&B, Covid) Nasopharyngeal Swab     Status: None   Collection Time: 04/03/2020  9:13 AM   Specimen: Nasopharyngeal Swab; Nasopharyngeal(NP) swabs in vial transport medium  Result Value Ref Range Status   SARS Coronavirus 2 by RT PCR NEGATIVE NEGATIVE Final    Comment: (NOTE) SARS-CoV-2 target nucleic acids are NOT DETECTED.  The SARS-CoV-2 RNA is generally detectable in upper respiratory specimens during the acute phase of infection. The lowest concentration of SARS-CoV-2 viral copies this assay can detect is 138 copies/mL. A negative result does not preclude SARS-Cov-2 infection and should not be used as the sole basis for treatment or other patient management decisions. A negative result may occur with  improper specimen collection/handling, submission of specimen other than nasopharyngeal swab, presence of viral mutation(s) within the areas targeted by this assay, and inadequate number of viral copies(<138 copies/mL). A negative result must be combined with clinical  observations, patient history, and epidemiological information. The expected result is Negative.  Fact Sheet for Patients:  EntrepreneurPulse.com.au  Fact Sheet for Healthcare Providers:  IncredibleEmployment.be  This test is no t yet approved or cleared by the [place] FDA and  has been authorized for detection and/or diagnosis of SARS-CoV-2 by FDA under an Emergency Use Authorization (EUA). This EUA will remain  in effect (meaning this test can be used) for the duration of the COVID-19 declaration under Section 564(b)(1) of the Act, 21 U.S.C.section 360bbb-3(b)(1), unless the authorization is terminated  or revoked sooner.       Influenza A by PCR NEGATIVE NEGATIVE Final   Influenza B by PCR NEGATIVE NEGATIVE Final    Comment: (NOTE)  The Xpert Xpress SARS-CoV-2/FLU/RSV plus assay is intended as an aid in the diagnosis of influenza from Nasopharyngeal swab specimens and should not be used as a sole basis for treatment. Nasal washings and aspirates are unacceptable for Xpert Xpress SARS-CoV-2/FLU/RSV testing.  Fact Sheet for Patients: EntrepreneurPulse.com.au  Fact Sheet for Healthcare Providers: IncredibleEmployment.be  This test is not yet approved or cleared by the [place] FDA and has been authorized for detection and/or diagnosis of SARS-CoV-2 by FDA under an Emergency Use Authorization (EUA). This EUA will remain in effect (meaning this test can be used) for the duration of the COVID-19 declaration under Section 564(b)(1) of the Act, 21 U.S.C. section 360bbb-3(b)(1), unless the authorization is terminated or revoked.  Performed at [facility], [place] [address], [place], [place] [address]          Radiology Studies: CT ABDOMEN PELVIS WO CONTRAST  Result Date: 04/06/2020 CLINICAL DATA:  Suspected diverticulitis. Abdominal pain unresolved by antibiotics EXAM: CT  ABDOMEN AND PELVIS WITHOUT CONTRAST TECHNIQUE: Multidetector CT imaging of the abdomen and pelvis was performed following the standard protocol without IV contrast. COMPARISON:  08/16/2016 FINDINGS: Lower chest: Mild right lower lobe scarring related to thoracic osteophytes. Possible minimal sliding hiatal hernia Hepatobiliary: Simple cystic densities in the liver measuring up to 2.5 cm left of the hepatic cava.No evidence of biliary obstruction or stone. Pancreas: Unremarkable. Spleen: Unremarkable. Adrenals/Urinary Tract: Negative adrenals. No hydronephrosis or stone. Lobulated kidneys which could be developmental or from scarring. Stable cystic density emanating from the interpolar left kidney. Unremarkable bladder. Stomach/Bowel: Innumerable colonic diverticula with inflammation around the distal descending diverticulum that is further complicated by a track like partially gas-filled structure which extends superiorly and laterally along the gutter and leads to a 18 mm gas-filled collection lateral to the colon, see coronal reformats. A sinus tract may resume further superior from this dominant gas collection. The colon is diffusely filled with fluid; no generalized colonic wall thickening. Oral contrast reaches the proximal colon without extravasation. No small bowel obstruction. Vascular/Lymphatic: Multifocal atheromatous calcification of the aorta and iliacs. Mild mesenteric adenopathy in the IMV distribution, presumably reactive Reproductive:Hysterectomy. Other: No ascites or pneumoperitoneum. Musculoskeletal: Generalized lumbar spine degeneration with scoliosis. T12 remote inferior endplate fracture. IMPRESSION: 1. Diverticulitis of the distal descending colon complicated by sinus tract and 18 mm collection in the low pericolic gutter. 2. Inferior mesenteric adenopathy which is presumably reactive, attention on follow-up. 3. Aortic Atherosclerosis (ICD10-I70.0). Electronically Signed   By: Monte Fantasia  M.D.   On: 04/24/2020 11:31        Scheduled Meds: . feeding supplement  1 Container Oral TID BM  . hydrALAZINE  50 mg Oral BID  . metoprolol succinate  50 mg Oral Daily  . rosuvastatin  20 mg Oral Daily   Continuous Infusions: . piperacillin-tazobactam 2.25 g (04/15/20 0439)     LOS: 1 day   Time spent: 30mins Greater than 50% of this time was spent in counseling, explanation of diagnosis, planning of further management, and coordination of care.  I have personally reviewed and interpreted on  04/15/2020 daily labs, imagings as discussed above under date review session and assessment and plans.  I reviewed all nursing notes, pharmacy notes, consultant notes,  vitals, pertinent old records  I have discussed plan of care as described above with RN , patient  on 04/15/2020  Voice Recognition /Dragon dictation system was used to create this note, attempts have been made to correct errors. Please contact the  author with questions and/or clarifications.   Florencia Reasons, MD PhD FACP [facility]  Available via Epic secure chat 7am-7pm for nonurgent issues Please page for urgent issues To page the attending provider between 7A-7P or the covering provider during after hours 7P-7A, please log into the web site [email or web address] and access using universal [place] password for that web site. If you do not have the password, please call the hospital operator.    04/15/2020, 10:56 AM

## 2020-04-15 NOTE — Discharge Instructions (Signed)

## 2020-04-15 NOTE — Progress Notes (Signed)
Initial Nutrition Assessment  INTERVENTION:   -Boost Breeze po TID, each supplement provides 250 kcal and 9 grams of protein -Provide fiber handouts in discharge instructions  NUTRITION DIAGNOSIS:   Increased nutrient needs related to acute illness (diverticulitis) as evidenced by estimated needs.  GOAL:   Patient will meet greater than or equal to 90% of their needs  MONITOR:   PO intake,Supplement acceptance,Labs,Weight trends,I & O's,Diet advancement  REASON FOR ASSESSMENT:   Malnutrition Screening Tool    ASSESSMENT:   85 year old female with past medical history of CVA on aspirin, CKD 4, hypertension, hyperlipidemia, diverticulitis, s/p appendectomy and hysterectomy who presented to the ED with 2 weeks of intermittent LLQ pain and blood in stool since this morning.  Patient in room, states she feels better today but still have intermittent pain. Pt was having abdominal pain for about 2 weeks PTA. States her appetite went away prior to those symptoms starting.  Pt tried the [employer] but states they are too sweet. Told pt to try it diluted with ice. If diet is advanced, will try a different supplement.   Pt states her UBW is 145-150 lbs. Unable to say when she last weighed this much. Per weight records, pt weighed about what she does now in July this year.   Medications reviewed.  Labs reviewed: Low Na  NUTRITION - FOCUSED PHYSICAL EXAM:  No depletions noted.  Diet Order:   Diet Order            Diet clear liquid Room service appropriate? Yes; Fluid consistency: Thin  Diet effective now                 EDUCATION NEEDS:   Education needs have been addressed  Skin:  Skin Assessment: Reviewed RN Assessment  Last BM:  12/21  Height:   Ht Readings from Last 1 Encounters:  04/06/2020 5\' 2"  (1.575 m)    Weight:   Wt Readings from Last 1 Encounters:  04/21/2020 59.9 kg   BMI:  Body mass index is 24.14 kg/m.  Estimated Nutritional Needs:   Kcal:   1500-1700  Protein:  70-85g  Fluid:  1.7L/day  Clayton Bibles, MS, RD, LDN Inpatient Clinical Dietitian Contact information available via Amion

## 2020-04-16 DIAGNOSIS — K5792 Diverticulitis of intestine, part unspecified, without perforation or abscess without bleeding: Secondary | ICD-10-CM | POA: Diagnosis not present

## 2020-04-16 LAB — BASIC METABOLIC PANEL
Anion gap: 8 (ref 5–15)
BUN: 21 mg/dL (ref 8–23)
CO2: 23 mmol/L (ref 22–32)
Calcium: 8.2 mg/dL — ABNORMAL LOW (ref 8.9–10.3)
Chloride: 110 mmol/L (ref 98–111)
Creatinine, Ser: 1.87 mg/dL — ABNORMAL HIGH (ref 0.44–1.00)
GFR, Estimated: 26 mL/min — ABNORMAL LOW (ref >60)
Glucose, Bld: 108 mg/dL — ABNORMAL HIGH (ref 70–99)
Potassium: 4.8 mmol/L (ref 3.5–5.1)
Sodium: 141 mmol/L (ref 135–145)

## 2020-04-16 LAB — CBC
HCT: 21.5 % — ABNORMAL LOW (ref 36.0–46.0)
Hemoglobin: 7 g/dL — ABNORMAL LOW (ref 12.0–15.0)
MCH: 27.5 pg (ref 26.0–34.0)
MCHC: 32.6 g/dL (ref 30.0–36.0)
MCV: 84.3 fL (ref 80.0–100.0)
Platelets: 323 10*3/uL (ref 150–400)
RBC: 2.55 MIL/uL — ABNORMAL LOW (ref 3.87–5.11)
RDW: 15.1 % (ref 11.5–15.5)
WBC: 15.2 10*3/uL — ABNORMAL HIGH (ref 4.0–10.5)
nRBC: 0 % (ref 0.0–0.2)

## 2020-04-16 LAB — PROTIME-INR
INR: 1.5 — ABNORMAL HIGH (ref 0.8–1.2)
Prothrombin Time: 17.8 seconds — ABNORMAL HIGH (ref 11.4–15.2)

## 2020-04-16 NOTE — Progress Notes (Signed)
PROGRESS NOTE    Martha Ortega  QQP:[id number] DOB: [birth year] DOA: 04/19/2020 PCP: [name], MD    Chief Complaint  Patient presents with   Blood In Stools   Abdominal Pain    Brief Narrative:  This is an 85 year old female with past medical history of CVA on aspirin, CKD 4, hypertension, hyperlipidemia, diverticulitis, s/p appendectomy and hysterectomy who presented to the ED with 2 weeks of intermittent LLQ pain and blood in stool since this morning.  She was seen by her PCP on 12/13 for LLQ pain and was given Augmentin for presumed diverticulitis which she completed yesterday.  Antibiotic did not really help much.  This morning around 5 AM she had dark blood in her stool and had another bloody BM in the ED.   Subjective:   Reports liquid bloody stool at home, no bm since in the hospital,  ab pain is less frequent, she wants to try diet advancement No n/v No fever  Assessment & Plan:   Principal Problem:   Diverticulitis Active Problems:   Hyperlipidemia   CKD (chronic kidney disease), stage IV (HCC)   Aortic atherosclerosis (HCC)   Blood in stool   ABLA (acute blood loss anemia)  Sepsis presents on admission with tachycardia/tachypnea/leukocytosis/lactic acidosis/Recurrent diveticulitis complicated by sinus tract and 80 mL collection in her low paracolic gutter/GI bleed -Lactic acid normalized with hydration and antibiotics, she reported having less pain -GI consulted and recommended the following: " I would not recommend colonoscopy at this time in regards to her bleeding especially if this is self limited / one time episode in the setting of diverticulitis. In fact, colonoscopy in general sounds like it may not be possible at all, patient was told previously she should never have another one due to risks. If she resolves this with course of IV antibiotics, could consider outpatient virtual colonoscopy to re-evaluate the area and ensure no polyps / mass  lesion in the area of concern. " "If she has recurrent or significant bleeding please contact us, would consider tagged RBC scan in this setting (CKD).  "  Acute blood loss anemia -Transfuse if hemoglobin less than 7 -Repeat CBC in the morning  History of hypertension, currently blood pressure low normal in the setting of GI bleed and diverticulitis Discontinue hydralazine, decrease metoprolol with holding parameters  History of aortic atherosclerosis Hold aspirin in the setting of GI bleed/acute anemia Continue statin  CKD 4, appears at baseline, renal dosing meds    DVT prophylaxis: SCDs Start: 04/07/2020 1309   Code Status:full Family Communication: declined my offer to update her son, reports son has already been by this am Disposition:   Status is: Inpatient   Dispo: The patient is from: home              Anticipated d/c is to: home              Anticipated d/c date is: TBD              Patient currently not medical stable to discharge  Consultants:   GI  Procedures:   None  Antimicrobials:    Zosyn    Objective: Vitals:   04/15/20 1329 04/15/20 2157 04/16/20 0521 04/16/20 1338  BP: (!) 112/55 (!) 106/57 118/71 95/75  Pulse: 75 83 91 90  Resp: 16 18 17 17   Temp: 97.7 F (36.5 C) 98.1 F (36.7 C) 98.7 F (37.1 C) 97.8 F (36.6 C)  TempSrc: Oral Oral Oral Oral  SpO2: 99% 98% 99% 98%  Weight:      Height:        Intake/Output Summary (Last 24 hours) at 04/16/2020 1546 Last data filed at 04/16/2020 1339 Gross per 24 hour  Intake 1570 ml  Output --  Net 1570 ml   Filed Weights   03/30/2020 1333  Weight: 59.9 kg    Examination:  General exam: calm, NAD Respiratory system: Clear to auscultation. Respiratory effort normal. Cardiovascular system: S1 & S2 heard, RRR. No JVD, no murmur, No pedal edema. Gastrointestinal system: Abdomen is nondistended, soft , left lower quadrant tenderness. Normal bowel sounds heard. Central nervous system: Alert  and oriented. No focal neurological deficits. Extremities: Symmetric 5 x 5 power. Skin: No rashes, lesions or ulcers Psychiatry: Judgement and insight appear normal. Mood & affect appropriate.     Data Reviewed: I have personally reviewed following labs and imaging studies  CBC: Recent Labs  Lab 04/04/2020 0910 04/20/2020 1916 04/15/20 0037 04/16/20 0531  WBC 23.4*  --  19.1* 15.2*  NEUTROABS 20.7*  --   --   --   HGB 10.0* 7.5* 7.1* 7.0*  HCT 30.5* 22.2* 21.3* 21.5*  MCV 83.8  --  82.2 84.3  PLT 459*  --  288 196    Basic Metabolic Panel: Recent Labs  Lab 04/23/2020 0910 04/15/20 0037 04/16/20 0531  NA 137 134* 141  K 4.9 4.4 4.8  CL 105 105 110  CO2 21* 22 23  GLUCOSE 205* 178* 108*  BUN 22 25* 21  CREATININE 2.09* 1.81* 1.87*  CALCIUM 8.9 7.9* 8.2*    GFR: Estimated Creatinine Clearance: 18 mL/min (A) (by C-G formula based on SCr of 1.87 mg/dL (H)).  Liver Function Tests: Recent Labs  Lab 04/21/2020 0910  AST 26  ALT 10  ALKPHOS 61  BILITOT 1.2  PROT 6.7  ALBUMIN 2.7*    CBG: No results for input(s): GLUCAP in the last 168 hours.   Recent Results (from the past 240 hour(s))  Resp Panel by RT-PCR (Flu A&B, Covid) Nasopharyngeal Swab     Status: None   Collection Time: 04/18/2020  9:13 AM   Specimen: Nasopharyngeal Swab; Nasopharyngeal(NP) swabs in vial transport medium  Result Value Ref Range Status   SARS Coronavirus 2 by RT PCR NEGATIVE NEGATIVE Final    Comment: (NOTE) SARS-CoV-2 target nucleic acids are NOT DETECTED.  The SARS-CoV-2 RNA is generally detectable in upper respiratory specimens during the acute phase of infection. The lowest concentration of SARS-CoV-2 viral copies this assay can detect is 138 copies/mL. A negative result does not preclude SARS-Cov-2 infection and should not be used as the sole basis for treatment or other patient management decisions. A negative result may occur with  improper specimen collection/handling, submission  of specimen other than nasopharyngeal swab, presence of viral mutation(s) within the areas targeted by this assay, and inadequate number of viral copies(<138 copies/mL). A negative result must be combined with clinical observations, patient history, and epidemiological information. The expected result is Negative.  Fact Sheet for Patients:  EntrepreneurPulse.com.au  Fact Sheet for Healthcare Providers:  IncredibleEmployment.be  This test is no t yet approved or cleared by the [place] FDA and  has been authorized for detection and/or diagnosis of SARS-CoV-2 by FDA under an Emergency Use Authorization (EUA). This EUA will remain  in effect (meaning this test can be used) for the duration of the COVID-19 declaration under Section 564(b)(1) of the Act, 21 U.S.C.section 360bbb-3(b)(1), unless the authorization is  terminated  or revoked sooner.       Influenza A by PCR NEGATIVE NEGATIVE Final   Influenza B by PCR NEGATIVE NEGATIVE Final    Comment: (NOTE) The Xpert Xpress SARS-CoV-2/FLU/RSV plus assay is intended as an aid in the diagnosis of influenza from Nasopharyngeal swab specimens and should not be used as a sole basis for treatment. Nasal washings and aspirates are unacceptable for Xpert Xpress SARS-CoV-2/FLU/RSV testing.  Fact Sheet for Patients: EntrepreneurPulse.com.au  Fact Sheet for Healthcare Providers: IncredibleEmployment.be  This test is not yet approved or cleared by the [place] FDA and has been authorized for detection and/or diagnosis of SARS-CoV-2 by FDA under an Emergency Use Authorization (EUA). This EUA will remain in effect (meaning this test can be used) for the duration of the COVID-19 declaration under Section 564(b)(1) of the Act, 21 U.S.C. section 360bbb-3(b)(1), unless the authorization is terminated or revoked.  Performed at [facility], [place]  [address] [place], [place] [address]          Radiology Studies: No results found.      Scheduled Meds:  feeding supplement  1 Container Oral TID BM   metoprolol succinate  12.5 mg Oral Daily   rosuvastatin  20 mg Oral Daily   Continuous Infusions:  piperacillin-tazobactam 2.25 g (04/16/20 1159)     LOS: 2 days   Time spent: 62mins Greater than 50% of this time was spent in counseling, explanation of diagnosis, planning of further management, and coordination of care.  I have personally reviewed and interpreted on  04/16/2020 daily labs, imagings as discussed above under date review session and assessment and plans.  I reviewed all nursing notes, pharmacy notes, consultant notes,  vitals, pertinent old records  I have discussed plan of care as described above with RN , patient  on 04/16/2020  Voice Recognition /Dragon dictation system was used to create this note, attempts have been made to correct errors. Please contact the author with questions and/or clarifications.   Florencia Reasons, MD PhD FACP [facility]  Available via Epic secure chat 7am-7pm for nonurgent issues Please page for urgent issues To page the attending provider between 7A-7P or the covering provider during after hours 7P-7A, please log into the web site [email or web address] and access using universal [place] password for that web site. If you do not have the password, please call the hospital operator.    04/16/2020, 3:46 PM

## 2020-04-16 NOTE — Plan of Care (Signed)
  Problem: Education: Goal: Required Educational Video(s) Outcome: Progressing   Problem: Clinical Measurements: Goal: Ability to maintain clinical measurements within normal limits will improve Outcome: Progressing Goal: Postoperative complications will be avoided or minimized Outcome: Progressing   Problem: Skin Integrity: Goal: Demonstration of wound healing without infection will improve Outcome: Progressing   Problem: Education: Goal: Ability to identify signs and symptoms of gastrointestinal bleeding will improve Outcome: Progressing   Problem: Bowel/Gastric: Goal: Will show no signs and symptoms of gastrointestinal bleeding Outcome: Progressing   Problem: Fluid Volume: Goal: Will show no signs and symptoms of excessive bleeding Outcome: Progressing   Problem: Clinical Measurements: Goal: Complications related to the disease process, condition or treatment will be avoided or minimized Outcome: Progressing

## 2020-04-16 NOTE — Plan of Care (Signed)
  Problem: Education: Goal: Required Educational Video(s) 04/16/2020 0341 by Josepha Pigg, RN Outcome: Progressing 04/16/2020 0158 by Josepha Pigg, RN Outcome: Progressing   Problem: Clinical Measurements: Goal: Ability to maintain clinical measurements within normal limits will improve 04/16/2020 0341 by Josepha Pigg, RN Outcome: Progressing 04/16/2020 0158 by Josepha Pigg, RN Outcome: Progressing Goal: Postoperative complications will be avoided or minimized 04/16/2020 0341 by Josepha Pigg, RN Outcome: Progressing 04/16/2020 0158 by Josepha Pigg, RN Outcome: Progressing   Problem: Skin Integrity: Goal: Demonstration of wound healing without infection will improve 04/16/2020 0341 by Josepha Pigg, RN Outcome: Progressing 04/16/2020 0158 by Josepha Pigg, RN Outcome: Progressing   Problem: Education: Goal: Ability to identify signs and symptoms of gastrointestinal bleeding will improve 04/16/2020 0341 by Josepha Pigg, RN Outcome: Progressing 04/16/2020 0158 by Josepha Pigg, RN Outcome: Progressing   Problem: Bowel/Gastric: Goal: Will show no signs and symptoms of gastrointestinal bleeding 04/16/2020 0341 by Josepha Pigg, RN Outcome: Progressing 04/16/2020 0158 by Josepha Pigg, RN Outcome: Progressing   Problem: Fluid Volume: Goal: Will show no signs and symptoms of excessive bleeding 04/16/2020 0341 by Josepha Pigg, RN Outcome: Progressing 04/16/2020 0158 by Josepha Pigg, RN Outcome: Progressing   Problem: Clinical Measurements: Goal: Complications related to the disease process, condition or treatment will be avoided or minimized 04/16/2020 0341 by Josepha Pigg, RN Outcome: Progressing 04/16/2020 0158 by Josepha Pigg, RN Outcome: Progressing   Problem: Education: Goal: Knowledge of General Education information will improve Description: Including pain rating scale,  medication(s)/side effects and non-pharmacologic comfort measures Outcome: Progressing   Problem: Health Behavior/Discharge Planning: Goal: Ability to manage health-related needs will improve Outcome: Progressing   Problem: Clinical Measurements: Goal: Ability to maintain clinical measurements within normal limits will improve Outcome: Progressing Goal: Will remain free from infection Outcome: Progressing Goal: Diagnostic test results will improve Outcome: Progressing Goal: Respiratory complications will improve Outcome: Progressing Goal: Cardiovascular complication will be avoided Outcome: Progressing   Problem: Activity: Goal: Risk for activity intolerance will decrease Outcome: Progressing   Problem: Nutrition: Goal: Adequate nutrition will be maintained Outcome: Progressing   Problem: Coping: Goal: Level of anxiety will decrease Outcome: Progressing   Problem: Elimination: Goal: Will not experience complications related to bowel motility Outcome: Progressing Goal: Will not experience complications related to urinary retention Outcome: Progressing   Problem: Pain Managment: Goal: General experience of comfort will improve Outcome: Progressing   Problem: Safety: Goal: Ability to remain free from injury will improve Outcome: Progressing   Problem: Skin Integrity: Goal: Risk for impaired skin integrity will decrease Outcome: Progressing

## 2020-04-16 NOTE — Progress Notes (Signed)
PROGRESS NOTE    Martha COLETTA  HFW:[id number] DOB: [birth year] DOA: 04/05/2020 PCP: [name], MD    Chief Complaint  Patient presents with  . Blood In Stools  . Abdominal Pain    Brief Narrative:  This is an 85 year old female with past medical history of CVA on aspirin, CKD 4, hypertension, hyperlipidemia, diverticulitis, s/p appendectomy and hysterectomy who presented to the ED with 2 weeks of intermittent LLQ pain and blood in stool since this morning.  She was seen by her PCP on 12/13 for LLQ pain and was given Augmentin for presumed diverticulitis which she completed yesterday.  Antibiotic did not really help much.  This morning around 5 AM she had dark blood in her stool and had another bloody BM in the ED.   Subjective:   Reports liquid bloody stool at home, no bm since in the hospital,  ab pain is less frequent, she wants to try diet advancement No n/v No fever  Assessment & Plan:   Principal Problem:   Diverticulitis Active Problems:   Hyperlipidemia   CKD (chronic kidney disease), stage IV (HCC)   Aortic atherosclerosis (HCC)   Blood in stool   ABLA (acute blood loss anemia)  Recurrent diveticulitis complicated by sinus tract and 80 mL collection in her low paracolic gutter/GI bleed -Lactic acid normalized with hydration and antibiotics, she reported having less pain -GI consulted and recommended the following: " I would not recommend colonoscopy at this time in regards to her bleeding especially if this is self limited / one time episode in the setting of diverticulitis. In fact, colonoscopy in general sounds like it may not be possible at all, patient was told previously she should never have another one due to risks. If she resolves this with course of IV antibiotics, could consider outpatient virtual colonoscopy to re-evaluate the area and ensure no polyps / mass lesion in the area of concern. " "If she has recurrent or significant bleeding please  contact us, would consider tagged RBC scan in this setting (CKD).  "  Acute blood loss anemia -Transfuse if hemoglobin less than 7 -Repeat CBC in the morning  History of hypertension, currently blood pressure low normal in the setting of GI bleed and diverticulitis Discontinue hydralazine, decrease metoprolol with holding parameters  History of aortic atherosclerosis Hold aspirin in the setting of GI bleed/acute anemia Continue statin  CKD 4, appears at baseline, renal dosing meds    DVT prophylaxis: SCDs Start: 04/13/2020 1309   Code Status:full Family Communication: declined my offer to update her son, reports son has already been by this am Disposition:   Status is: Inpatient   Dispo: The patient is from: home              Anticipated d/c is to: home              Anticipated d/c date is: TBD              Patient currently not medical stable to discharge  Consultants:   GI  Procedures:   None  Antimicrobials:    Zosyn    Objective: Vitals:   04/15/20 1032 04/15/20 1329 04/15/20 2157 04/16/20 0521  BP: (!) 96/50 (!) 112/55 (!) 106/57 118/71  Pulse: 85 75 83 91  Resp:  16 18 17   Temp:  97.7 F (36.5 C) 98.1 F (36.7 C) 98.7 F (37.1 C)  TempSrc:  Oral Oral Oral  SpO2:  99% 98% 99%  Weight:  Height:        Intake/Output Summary (Last 24 hours) at 04/16/2020 1219 Last data filed at 04/16/2020 1000 Gross per 24 hour  Intake 1620 ml  Output --  Net 1620 ml   Filed Weights   04/16/2020 1333  Weight: 59.9 kg    Examination:  General exam: calm, NAD Respiratory system: Clear to auscultation. Respiratory effort normal. Cardiovascular system: S1 & S2 heard, RRR. No JVD, no murmur, No pedal edema. Gastrointestinal system: Abdomen is nondistended, soft , left lower quadrant tenderness. Normal bowel sounds heard. Central nervous system: Alert and oriented. No focal neurological deficits. Extremities: Symmetric 5 x 5 power. Skin: No rashes, lesions  or ulcers Psychiatry: Judgement and insight appear normal. Mood & affect appropriate.     Data Reviewed: I have personally reviewed following labs and imaging studies  CBC: Recent Labs  Lab 04/16/2020 0910 04/06/2020 1916 04/15/20 0037 04/16/20 0531  WBC 23.4*  --  19.1* 15.2*  NEUTROABS 20.7*  --   --   --   HGB 10.0* 7.5* 7.1* 7.0*  HCT 30.5* 22.2* 21.3* 21.5*  MCV 83.8  --  82.2 84.3  PLT 459*  --  288 998    Basic Metabolic Panel: Recent Labs  Lab 04/19/2020 0910 04/15/20 0037 04/16/20 0531  NA 137 134* 141  K 4.9 4.4 4.8  CL 105 105 110  CO2 21* 22 23  GLUCOSE 205* 178* 108*  BUN 22 25* 21  CREATININE 2.09* 1.81* 1.87*  CALCIUM 8.9 7.9* 8.2*    GFR: Estimated Creatinine Clearance: 18 mL/min (A) (by C-G formula based on SCr of 1.87 mg/dL (H)).  Liver Function Tests: Recent Labs  Lab 04/12/2020 0910  AST 26  ALT 10  ALKPHOS 61  BILITOT 1.2  PROT 6.7  ALBUMIN 2.7*    CBG: No results for input(s): GLUCAP in the last 168 hours.   Recent Results (from the past 240 hour(s))  Resp Panel by RT-PCR (Flu A&B, Covid) Nasopharyngeal Swab     Status: None   Collection Time: 04/18/2020  9:13 AM   Specimen: Nasopharyngeal Swab; Nasopharyngeal(NP) swabs in vial transport medium  Result Value Ref Range Status   SARS Coronavirus 2 by RT PCR NEGATIVE NEGATIVE Final    Comment: (NOTE) SARS-CoV-2 target nucleic acids are NOT DETECTED.  The SARS-CoV-2 RNA is generally detectable in upper respiratory specimens during the acute phase of infection. The lowest concentration of SARS-CoV-2 viral copies this assay can detect is 138 copies/mL. A negative result does not preclude SARS-Cov-2 infection and should not be used as the sole basis for treatment or other patient management decisions. A negative result may occur with  improper specimen collection/handling, submission of specimen other than nasopharyngeal swab, presence of viral mutation(s) within the areas targeted by  this assay, and inadequate number of viral copies(<138 copies/mL). A negative result must be combined with clinical observations, patient history, and epidemiological information. The expected result is Negative.  Fact Sheet for Patients:  EntrepreneurPulse.com.au  Fact Sheet for Healthcare Providers:  IncredibleEmployment.be  This test is no t yet approved or cleared by the [place] FDA and  has been authorized for detection and/or diagnosis of SARS-CoV-2 by FDA under an Emergency Use Authorization (EUA). This EUA will remain  in effect (meaning this test can be used) for the duration of the COVID-19 declaration under Section 564(b)(1) of the Act, 21 U.S.C.section 360bbb-3(b)(1), unless the authorization is terminated  or revoked sooner.       Influenza  A by PCR NEGATIVE NEGATIVE Final   Influenza B by PCR NEGATIVE NEGATIVE Final    Comment: (NOTE) The Xpert Xpress SARS-CoV-2/FLU/RSV plus assay is intended as an aid in the diagnosis of influenza from Nasopharyngeal swab specimens and should not be used as a sole basis for treatment. Nasal washings and aspirates are unacceptable for Xpert Xpress SARS-CoV-2/FLU/RSV testing.  Fact Sheet for Patients: EntrepreneurPulse.com.au  Fact Sheet for Healthcare Providers: IncredibleEmployment.be  This test is not yet approved or cleared by the [place] FDA and has been authorized for detection and/or diagnosis of SARS-CoV-2 by FDA under an Emergency Use Authorization (EUA). This EUA will remain in effect (meaning this test can be used) for the duration of the COVID-19 declaration under Section 564(b)(1) of the Act, 21 U.S.C. section 360bbb-3(b)(1), unless the authorization is terminated or revoked.  Performed at [facility], [employer] [address] [place], [place] [address]          Radiology Studies: No results  found.      Scheduled Meds: . feeding supplement  1 Container Oral TID BM  . metoprolol succinate  12.5 mg Oral Daily  . rosuvastatin  20 mg Oral Daily   Continuous Infusions: . piperacillin-tazobactam 2.25 g (04/16/20 1159)     LOS: 2 days   Time spent: 49mins Greater than 50% of this time was spent in counseling, explanation of diagnosis, planning of further management, and coordination of care.  I have personally reviewed and interpreted on  04/16/2020 daily labs, imagings as discussed above under date review session and assessment and plans.  I reviewed all nursing notes, pharmacy notes, consultant notes,  vitals, pertinent old records  I have discussed plan of care as described above with RN , patient  on 04/16/2020  Voice Recognition /Dragon dictation system was used to create this note, attempts have been made to correct errors. Please contact the author with questions and/or clarifications.   Florencia Reasons, MD PhD FACP [facility]  Available via Epic secure chat 7am-7pm for nonurgent issues Please page for urgent issues To page the attending provider between 7A-7P or the covering provider during after hours 7P-7A, please log into the web site [email or web address] and access using universal [place] password for that web site. If you do not have the password, please call the hospital operator.    04/16/2020, 12:19 PM

## 2020-04-17 DIAGNOSIS — K5792 Diverticulitis of intestine, part unspecified, without perforation or abscess without bleeding: Secondary | ICD-10-CM | POA: Diagnosis not present

## 2020-04-17 LAB — CBC
HCT: 22.6 % — ABNORMAL LOW (ref 36.0–46.0)
Hemoglobin: 7.2 g/dL — ABNORMAL LOW (ref 12.0–15.0)
MCH: 27.4 pg (ref 26.0–34.0)
MCHC: 31.9 g/dL (ref 30.0–36.0)
MCV: 85.9 fL (ref 80.0–100.0)
Platelets: 361 10*3/uL (ref 150–400)
RBC: 2.63 MIL/uL — ABNORMAL LOW (ref 3.87–5.11)
RDW: 15.4 % (ref 11.5–15.5)
WBC: 11.4 10*3/uL — ABNORMAL HIGH (ref 4.0–10.5)
nRBC: 0 % (ref 0.0–0.2)

## 2020-04-17 LAB — BASIC METABOLIC PANEL
Anion gap: 10 (ref 5–15)
BUN: 15 mg/dL (ref 8–23)
CO2: 21 mmol/L — ABNORMAL LOW (ref 22–32)
Calcium: 8.2 mg/dL — ABNORMAL LOW (ref 8.9–10.3)
Chloride: 109 mmol/L (ref 98–111)
Creatinine, Ser: 1.76 mg/dL — ABNORMAL HIGH (ref 0.44–1.00)
GFR, Estimated: 28 mL/min — ABNORMAL LOW (ref >60)
Glucose, Bld: 132 mg/dL — ABNORMAL HIGH (ref 70–99)
Potassium: 4 mmol/L (ref 3.5–5.1)
Sodium: 140 mmol/L (ref 135–145)

## 2020-04-17 NOTE — Progress Notes (Signed)
PROGRESS NOTE    Martha Ortega  NWG:[id number] DOB: 25-Jul-1934 DOA: 03/29/2020 PCP: [name], MD    Chief Complaint  Patient presents with  . Blood In Stools  . Abdominal Pain    Brief Narrative:  This is an 85 year old female with past medical history of CVA on aspirin, CKD 4, hypertension, hyperlipidemia, diverticulitis, s/p appendectomy and hysterectomy who presented to the ED with 2 weeks of intermittent LLQ pain and blood in stool since this morning.  She was seen by her PCP on 12/13 for LLQ pain and was given Augmentin for presumed diverticulitis which she completed yesterday.  Antibiotic did not really help much.  This morning around 5 AM she had dark blood in her stool and had another bloody BM in the ED.   Subjective:   Reports liquid bloody stool at home, no bm since in the hospital, Persistent left lower quadrant pain No n/v No fever  Assessment & Plan:   Principal Problem:   Diverticulitis Active Problems:   Hyperlipidemia   CKD (chronic kidney disease), stage IV (HCC)   Aortic atherosclerosis (HCC)   Blood in stool   ABLA (acute blood loss anemia)  Sepsis presents on admission with tachycardia/tachypnea/leukocytosis/lactic acidosis/Recurrent diveticulitis complicated by sinus tract and 80 mL collection in her low paracolic gutter/GI bleed -She was hurting for 2 weeks prior to coming to the hospital, failed outpatient antibiotic Augmentin treatment -Lactic acid normalized with hydration and antibiotics, she reported having less pain -GI consulted and recommended the following: " I would not recommend colonoscopy at this time in regards to her bleeding especially if this is self limited / one time episode in the setting of diverticulitis. In fact, colonoscopy in general sounds like it may not be possible at all, patient was told previously she should never have another one due to risks. If she resolves this with course of IV antibiotics, could consider  outpatient virtual colonoscopy to re-evaluate the area and ensure no polyps / mass lesion in the area of concern. " "If she has recurrent or significant bleeding please contact us, would consider tagged RBC scan in this setting (CKD).  " -Lab works improving, however persistent left lower quadrant pain, continue IV antibiotics, may need repeat imaging ,consider general surgery consult if no improvement in the next 24 hours  Acute blood loss anemia -Transfuse if hemoglobin less than 7 -Repeat CBC in the morning  History of hypertension, currently blood pressure low normal in the setting of GI bleed and diverticulitis Discontinue hydralazine, decrease metoprolol with holding parameters  History of aortic atherosclerosis Hold aspirin in the setting of GI bleed/acute anemia Continue statin  CKD 4, appears at baseline, renal dosing meds    DVT prophylaxis: SCDs Start: 04/04/2020 1309   Code Status:full Family Communication: Son updated over the phone with permission Disposition:   Status is: Inpatient   Dispo: The patient is from: home              Anticipated d/c is to: home              Anticipated d/c date is: TBD              Patient currently not medical stable to discharge  Consultants:   GI  Procedures:   None  Antimicrobials:    Zosyn    Objective: Vitals:   04/16/20 0521 04/16/20 1338 04/16/20 2105 04/17/20 0546  BP: 118/71 95/75 137/65 124/66  Pulse: 91 90 84 80  Resp:  17 17 18 16   Temp: 98.7 F (37.1 C) 97.8 F (36.6 C) 98.5 F (36.9 C) 97.6 F (36.4 C)  TempSrc: Oral Oral  Oral  SpO2: 99% 98% 100% 97%  Weight:      Height:        Intake/Output Summary (Last 24 hours) at 04/17/2020 1047 Last data filed at 04/17/2020 1000 Gross per 24 hour  Intake 1420 ml  Output 0 ml  Net 1420 ml   Filed Weights   04/19/2020 1333  Weight: 59.9 kg    Examination:  General exam: calm, NAD Respiratory system: Clear to auscultation. Respiratory effort  normal. Cardiovascular system: S1 & S2 heard, RRR. No JVD, no murmur, No pedal edema. Gastrointestinal system: Abdomen is nondistended, soft , left lower quadrant tenderness. Normal bowel sounds heard. Central nervous system: Alert and oriented. No focal neurological deficits. Extremities: Symmetric 5 x 5 power. Skin: No rashes, lesions or ulcers Psychiatry: Judgement and insight appear normal. Mood & affect appropriate.     Data Reviewed: I have personally reviewed following labs and imaging studies  CBC: Recent Labs  Lab 04/18/2020 0910 04/11/2020 1916 04/15/20 0037 04/16/20 0531 04/17/20 0526  WBC 23.4*  --  19.1* 15.2* 11.4*  NEUTROABS 20.7*  --   --   --   --   HGB 10.0* 7.5* 7.1* 7.0* 7.2*  HCT 30.5* 22.2* 21.3* 21.5* 22.6*  MCV 83.8  --  82.2 84.3 85.9  PLT 459*  --  288 323 403    Basic Metabolic Panel: Recent Labs  Lab 04/21/2020 0910 04/15/20 0037 04/16/20 0531 04/17/20 0526  NA 137 134* 141 140  K 4.9 4.4 4.8 4.0  CL 105 105 110 109  CO2 21* 22 23 21*  GLUCOSE 205* 178* 108* 132*  BUN 22 25* 21 15  CREATININE 2.09* 1.81* 1.87* 1.76*  CALCIUM 8.9 7.9* 8.2* 8.2*    GFR: Estimated Creatinine Clearance: 19.2 mL/min (A) (by C-G formula based on SCr of 1.76 mg/dL (H)).  Liver Function Tests: Recent Labs  Lab 04/13/2020 0910  AST 26  ALT 10  ALKPHOS 61  BILITOT 1.2  PROT 6.7  ALBUMIN 2.7*    CBG: No results for input(s): GLUCAP in the last 168 hours.   Recent Results (from the past 240 hour(s))  Resp Panel by RT-PCR (Flu A&B, Covid) Nasopharyngeal Swab     Status: None   Collection Time: 04/11/2020  9:13 AM   Specimen: Nasopharyngeal Swab; Nasopharyngeal(NP) swabs in vial transport medium  Result Value Ref Range Status   SARS Coronavirus 2 by RT PCR NEGATIVE NEGATIVE Final    Comment: (NOTE) SARS-CoV-2 target nucleic acids are NOT DETECTED.  The SARS-CoV-2 RNA is generally detectable in upper respiratory specimens during the acute phase of  infection. The lowest concentration of SARS-CoV-2 viral copies this assay can detect is 138 copies/mL. A negative result does not preclude SARS-Cov-2 infection and should not be used as the sole basis for treatment or other patient management decisions. A negative result may occur with  improper specimen collection/handling, submission of specimen other than nasopharyngeal swab, presence of viral mutation(s) within the areas targeted by this assay, and inadequate number of viral copies(<138 copies/mL). A negative result must be combined with clinical observations, patient history, and epidemiological information. The expected result is Negative.  Fact Sheet for Patients:  EntrepreneurPulse.com.au  Fact Sheet for Healthcare Providers:  IncredibleEmployment.be  This test is no t yet approved or cleared by the [place] and  has been authorized for detection and/or diagnosis of SARS-CoV-2 by FDA under an Emergency Use Authorization (EUA). This EUA will remain  in effect (meaning this test can be used) for the duration of the COVID-19 declaration under Section 564(b)(1) of the Act, 21 U.S.C.section 360bbb-3(b)(1), unless the authorization is terminated  or revoked sooner.       Influenza A by PCR NEGATIVE NEGATIVE Final   Influenza B by PCR NEGATIVE NEGATIVE Final    Comment: (NOTE) The Xpert Xpress SARS-CoV-2/FLU/RSV plus assay is intended as an aid in the diagnosis of influenza from Nasopharyngeal swab specimens and should not be used as a sole basis for treatment. Nasal washings and aspirates are unacceptable for Xpert Xpress SARS-CoV-2/FLU/RSV testing.  Fact Sheet for Patients: EntrepreneurPulse.com.au  Fact Sheet for Healthcare Providers: IncredibleEmployment.be  This test is not yet approved or cleared by the [place] FDA and has been authorized for detection and/or diagnosis of SARS-CoV-2  by FDA under an Emergency Use Authorization (EUA). This EUA will remain in effect (meaning this test can be used) for the duration of the COVID-19 declaration under Section 564(b)(1) of the Act, 21 U.S.C. section 360bbb-3(b)(1), unless the authorization is terminated or revoked.  Performed at [facility], [place] [address], [place], [place] [address]          Radiology Studies: No results found.      Scheduled Meds: . feeding supplement  1 Container Oral TID BM  . metoprolol succinate  12.5 mg Oral Daily  . rosuvastatin  20 mg Oral Daily   Continuous Infusions: . piperacillin-tazobactam 2.25 g (04/17/20 0438)     LOS: 3 days   Time spent: 34mins Greater than 50% of this time was spent in counseling, explanation of diagnosis, planning of further management, and coordination of care.  I have personally reviewed and interpreted on  04/17/2020 daily labs, imagings as discussed above under date review session and assessment and plans.  I reviewed all nursing notes, pharmacy notes, consultant notes,  vitals, pertinent old records  I have discussed plan of care as described above with RN , patient  on 04/17/2020  Voice Recognition /Dragon dictation system was used to create this note, attempts have been made to correct errors. Please contact the author with questions and/or clarifications.   Florencia Reasons, MD PhD FACP [facility]  Available via Epic secure chat 7am-7pm for nonurgent issues Please page for urgent issues To page the attending provider between 7A-7P or the covering provider during after hours 7P-7A, please log into the web site [email or web address] and access using universal [place] password for that web site. If you do not have the password, please call the hospital operator.    04/17/2020, 10:47 AM

## 2020-04-17 NOTE — Evaluation (Signed)
Physical Therapy Evaluation Patient Details Name: Martha Ortega MRN: [id number] DOB: 05/18/1934 Today's Date: 04/17/2020   History of Present Illness  Pt admitted with abdominal pain, blood in stool and sepsis 2* IB related to diverticulitis.  Pt with hx of CVA ~ 5 yrs ago with residual L UE and chest "tightness" and L foot "numbness  Clinical Impression  Pt admitted as above and presenting with functional mobility limitations 2* generalized weakness, ongoing abdominal discomfort, residual L side deficits from prior CVA and ambulatory balance deficits.  This date pt requiring min assist for balance/stabilty with ambulation but states she has not moved much since Tuesday and that she will regain prior level of function with time and increased mobilization.      Follow Up Recommendations Home health PT;No PT follow up (dependent on acute stay progress)    Equipment Recommendations  None recommended by PT    Recommendations for Other Services       Precautions / Restrictions Precautions Precautions: Fall Restrictions Weight Bearing Restrictions: No      Mobility  Bed Mobility Overal bed mobility: Needs Assistance Bed Mobility: Supine to Sit     Supine to sit: Supervision     General bed mobility comments: use of bed rail but no physical assist    Transfers Overall transfer level: Needs assistance Equipment used: None Transfers: Sit to/from Stand Sit to Stand: Min guard         General transfer comment: steady assist only  Ambulation/Gait Ambulation/Gait assistance: Min assist;Min guard Gait Distance (Feet): 200 Feet Assistive device: Rolling walker (2 wheeled);1 person hand held assist Gait Pattern/deviations: Step-through pattern;Decreased step length - right;Decreased step length - left;Steppage;Trunk flexed;Narrow base of support;Wide base of support Gait velocity: decr   General Gait Details: General instability requiring min assist for balance/safety and  partially corrected with use of RW.  Stairs            Wheelchair Mobility    Modified Rankin (Stroke Patients Only)       Balance Overall balance assessment: Needs assistance Sitting-balance support: No upper extremity supported;Feet supported Sitting balance-Leahy Scale: Good     Standing balance support: No upper extremity supported Standing balance-Leahy Scale: Fair                               Pertinent Vitals/Pain Pain Assessment: 0-10 Pain Score: 3  Pain Location: abdomen Pain Descriptors / Indicators: Aching;Sore Pain Intervention(s): Limited activity within patient's tolerance;Monitored during session;Premedicated before session    Home Living Family/patient expects to be discharged to:: Private residence Living Arrangements: Children Available Help at Discharge: Family Type of Home: House Home Access: Stairs to enter Entrance Stairs-Rails: Right Entrance Stairs-Number of Steps: 7 Home Layout: One level Home Equipment: Environmental consultant - 2 wheels;Bedside commode      Prior Function Level of Independence: Independent         Comments: Pt states ambulating sans assistive device     Hand Dominance        Extremity/Trunk Assessment   Upper Extremity Assessment Upper Extremity Assessment: LUE deficits/detail LUE Deficits / Details: c/o "tighness" with functional strength but decresased fine motor LUE Coordination: decreased fine motor    Lower Extremity Assessment Lower Extremity Assessment: LLE deficits/detail LLE Deficits / Details: Strength within functional limits but with c/o foot numbness and noted mild steppage in gait LLE Sensation: decreased proprioception;decreased light touch       Communication  Communication: No difficulties;HOH  Cognition Arousal/Alertness: Awake/alert Behavior During Therapy: WFL for tasks assessed/performed Overall Cognitive Status: Within Functional Limits for tasks assessed                                         General Comments      Exercises     Assessment/Plan    PT Assessment Patient needs continued PT services  PT Problem List Decreased strength;Decreased range of motion;Decreased activity tolerance;Decreased balance;Decreased mobility;Decreased knowledge of use of DME;Pain;Decreased safety awareness       PT Treatment Interventions DME instruction;Gait training;Stair training;Functional mobility training;Therapeutic activities;Therapeutic exercise;Patient/family education;Balance training    PT Goals (Current goals can be found in the Care Plan section)  Acute Rehab PT Goals Patient Stated Goal: HOME PT Goal Formulation: With patient Time For Goal Achievement: 05-17-2020 Potential to Achieve Goals: Good    Frequency Min 3X/week   Barriers to discharge        Co-evaluation               AM-PAC PT "6 Clicks" Mobility  Outcome Measure Help needed turning from your back to your side while in a flat bed without using bedrails?: None Help needed moving from lying on your back to sitting on the side of a flat bed without using bedrails?: A Little Help needed moving to and from a bed to a chair (including a wheelchair)?: A Little Help needed standing up from a chair using your arms (e.g., wheelchair or bedside chair)?: A Little Help needed to walk in hospital room?: A Little Help needed climbing 3-5 steps with a railing? : A Little 6 Click Score: 19    End of Session Equipment Utilized During Treatment: Gait belt Activity Tolerance: Patient tolerated treatment well Patient left: in chair;with call bell/phone within reach;with chair alarm set Nurse Communication: Mobility status PT Visit Diagnosis: Unsteadiness on feet (R26.81);Muscle weakness (generalized) (M62.81);Difficulty in walking, not elsewhere classified (R26.2);Pain Pain - part of body:  (abdomen)    Time: 0947-0962 PT Time Calculation (min) (ACUTE ONLY): 19 min   Charges:    PT Evaluation $PT Eval Low Complexity: 1 Low          [place] Pager [phone] Office [phone]   Warrene Kapfer 04/17/2020, 2:17 PM

## 2020-04-18 ENCOUNTER — Inpatient Hospital Stay (HOSPITAL_COMMUNITY): Payer: Medicare Other

## 2020-04-18 ENCOUNTER — Encounter (HOSPITAL_COMMUNITY): Payer: Self-pay | Admitting: Internal Medicine

## 2020-04-18 DIAGNOSIS — K5792 Diverticulitis of intestine, part unspecified, without perforation or abscess without bleeding: Secondary | ICD-10-CM | POA: Diagnosis not present

## 2020-04-18 LAB — BASIC METABOLIC PANEL
Anion gap: 12 (ref 5–15)
BUN: 13 mg/dL (ref 8–23)
CO2: 20 mmol/L — ABNORMAL LOW (ref 22–32)
Calcium: 8.3 mg/dL — ABNORMAL LOW (ref 8.9–10.3)
Chloride: 109 mmol/L (ref 98–111)
Creatinine, Ser: 1.86 mg/dL — ABNORMAL HIGH (ref 0.44–1.00)
GFR, Estimated: 27 mL/min — ABNORMAL LOW (ref >60)
Glucose, Bld: 116 mg/dL — ABNORMAL HIGH (ref 70–99)
Potassium: 3.2 mmol/L — ABNORMAL LOW (ref 3.5–5.1)
Sodium: 141 mmol/L (ref 135–145)

## 2020-04-18 LAB — CBC
HCT: 23.6 % — ABNORMAL LOW (ref 36.0–46.0)
Hemoglobin: 7.6 g/dL — ABNORMAL LOW (ref 12.0–15.0)
MCH: 27.7 pg (ref 26.0–34.0)
MCHC: 32.2 g/dL (ref 30.0–36.0)
MCV: 86.1 fL (ref 80.0–100.0)
Platelets: 433 10*3/uL — ABNORMAL HIGH (ref 150–400)
RBC: 2.74 MIL/uL — ABNORMAL LOW (ref 3.87–5.11)
RDW: 15.6 % — ABNORMAL HIGH (ref 11.5–15.5)
WBC: 10.2 10*3/uL (ref 4.0–10.5)
nRBC: 0 % (ref 0.0–0.2)

## 2020-04-18 LAB — LACTIC ACID, PLASMA: Lactic Acid, Venous: 1.2 mmol/L (ref 0.5–1.9)

## 2020-04-18 MED ORDER — IOHEXOL 9 MG/ML PO SOLN
500.0000 mL | ORAL | Status: AC
  Administered 2020-04-18 (×2): 500 mL via ORAL

## 2020-04-18 MED ORDER — IOHEXOL 9 MG/ML PO SOLN
ORAL | Status: AC
  Filled 2020-04-18: qty 1000

## 2020-04-18 MED ORDER — POTASSIUM CHLORIDE CRYS ER 20 MEQ PO TBCR
40.0000 meq | EXTENDED_RELEASE_TABLET | Freq: Once | ORAL | Status: AC
  Administered 2020-04-18: 10:00:00 40 meq via ORAL
  Filled 2020-04-18: qty 2

## 2020-04-18 MED ORDER — POLYETHYLENE GLYCOL 3350 17 G PO PACK
17.0000 g | PACK | Freq: Once | ORAL | Status: AC
  Administered 2020-04-18: 21:00:00 17 g via ORAL
  Filled 2020-04-18: qty 1

## 2020-04-18 NOTE — Consult Note (Signed)
CC: abdominal pain  Requesting provider: Dr Erlinda Hong  HPI: Martha Ortega is an 85 y.o. female who is here for left-sided diverticulitis.  She was admitted 4 days ago with refractory diverticulitis and started on IV Zosyn.  She also came to the emergency room because of a bloody stool.  She had acute blood loss anemia but did not require blood transfusion yet.  She has had no more melena or hematochezia.  In fact she states she has not had a bowel movement since she has been admitted.  She apparently developed left-sided left lower quadrant pain about on December 13 for intermittent left lower quadrant pain and was put on oral Augmentin for 2 weeks.  But she came to the emergency room with bloody stools.  She was found to have a white blood cell count of 23,000.  Her hemoglobin had been 12 on December 13 but was 10 on admission.  It is decreased to around 7 but has remained stable.  GI was consulted and since her bloody bowel movement was an isolated event they recommended just observation and a tagged red blood cell scan should she have a recurrent episode of bloody stool.  The patient has been improving clinically with respect to tolerating a diet, no fever, and a normalizing white blood cell count however she complains of ongoing intermittent left lower quadrant pain so we were asked to consult.  On her admission CT scan there was evidence of a small sinus tract leading to an 18 mm fluid collection adjacent to the colon.  Patient's last attempted colonoscopy was in 2007 but it was aborted due to tortuosity and an area of severe narrowing and was felt to be at a high risk for perforation if they persisted.  This was followed up by a barium enema around that time which showed an area of segmental narrowing in the proximal sigmoid colon.  It was felt to be due to diverticulitis and recommended follow-up imaging once the episode had resolved but cannot find any evidence of repeat barium enema or another  attempt at colonoscopy.  Her CT scan on admission also showed some inferior mesenteric adenopathy which was felt to be reactive due to the inflammation  Patient denies any weight loss.  She states she normally has a bowel movement at least every other day.  No stool caliber changes until just before admission.  No episodes of bloating and abdominal distention.  She lives at home with her son.  She does not use any assistive devices.  She states that she had a stroke about 5 years ago on her left side and does not really have any residual deficits.    Past Medical History:  Diagnosis Date  . Abnormal blood findings    baseline cr 1.3, watching for renal insufficiency  . Allergic rhinitis   . History of diverticulitis of colon   . Hyperglycemia 09   mild  . Hyperlipidemia   . Hypertension   . Stroke (cerebrum) [facility])     Past Surgical History:  Procedure Laterality Date  . ABDOMINAL HYSTERECTOMY    . APPENDECTOMY     Patient states not sure if this is true  . barium swallow  10/18/03  . CARDIOVASCULAR STRESS TEST  07/21/04  . CYSTECTOMY  93   R axilla  . CYSTECTOMY  97   R breast    Family History  Problem Relation Age of Onset  . Arthritis Mother   . Arthritis Sister   .  Lupus Sister        ?  . Diabetes Sister   . Prostate cancer Brother   . Stroke Father   . Hypertension Father        ?    Social:  reports that she quit smoking about 42 years ago. She has never used smokeless tobacco. She reports that she does not drink alcohol and does not use drugs.  Allergies:  Allergies  Allergen Reactions  . Amlodipine Swelling    Throat and mouth swelling  . Fluticasone Propionate     REACTION: does not help  . Loratadine     REACTION: does not work  . Lovastatin     REACTION: was not effective  . Omeprazole Swelling    Throat, lips, and mouth swelling  . Zocor [Simvastatin - High Dose]     Mouth and throat swelled    Medications: I have reviewed the patient's  current medications.   ROS - all of the below systems have been reviewed with the patient and positives are indicated with bold text General: chills, fever or night sweats Eyes: blurry vision or double vision ENT: epistaxis or sore throat Allergy/Immunology: itchy/watery eyes or nasal congestion Hematologic/Lymphatic: bleeding problems, blood clots or swollen lymph nodes Endocrine: temperature intolerance or unexpected weight changes Breast: new or changing breast lumps or nipple discharge Resp: cough, shortness of breath, or wheezing CV: chest pain or dyspnea on exertion GI: as per HPI GU: dysuria, trouble voiding, or hematuria MSK: joint pain or joint stiffness Neuro: TIA or stroke symptoms Derm: pruritus and skin lesion changes Psych: anxiety and depression  PE Blood pressure 137/75, pulse 91, temperature 98.5 F (36.9 C), temperature source Oral, resp. rate 16, height 5\' 2"  (1.575 m), weight 59.9 kg, SpO2 99 %. Constitutional: NAD; conversant; no deformities Eyes: Moist conjunctiva; no lid lag; anicteric; PERRL Neck: Trachea midline; no thyromegaly Lungs: Normal respiratory effort; no tactile fremitus CV: RRR; no palpable thrills; no pitting edema GI: Abd soft, nondistended, very MILD ttp to LLQ, definitely no rebound/guarding; no palpable hepatosplenomegaly MSK: Normal gait; no clubbing/cyanosis Psychiatric: Appropriate affect; alert and oriented x3 Lymphatic: No palpable cervical or axillary lymphadenopathy Skin:no rash/lesions/induration  Results for orders placed or performed during the hospital encounter of 04/07/2020 (from the past 48 hour(s))  Basic metabolic panel     Status: Abnormal   Collection Time: 04/17/20  5:26 AM  Result Value Ref Range   Sodium 140 135 - 145 mmol/L   Potassium 4.0 3.5 - 5.1 mmol/L   Chloride 109 98 - 111 mmol/L   CO2 21 (L) 22 - 32 mmol/L   Glucose, Bld 132 (H) 70 - 99 mg/dL    Comment: Glucose reference range applies only to samples  taken after fasting for at least 8 hours.   BUN 15 8 - 23 mg/dL   Creatinine, Ser 1.76 (H) 0.44 - 1.00 mg/dL   Calcium 8.2 (L) 8.9 - 10.3 mg/dL   GFR, Estimated 28 (L) >60 mL/min    Comment: (NOTE) Calculated using the CKD-EPI Creatinine Equation (2021)    Anion gap 10 5 - 15    Comment: Performed at [facility], [place] [address] [place], [place] [address]  CBC     Status: Abnormal   Collection Time: 04/17/20  5:26 AM  Result Value Ref Range   WBC 11.4 (H) 4.0 - 10.5 K/uL   RBC 2.63 (L) 3.87 - 5.11 MIL/uL   Hemoglobin 7.2 (L) 12.0 - 15.0 g/dL  HCT 22.6 (L) 36.0 - 46.0 %   MCV 85.9 80.0 - 100.0 fL   MCH 27.4 26.0 - 34.0 pg   MCHC 31.9 30.0 - 36.0 g/dL   RDW 15.4 11.5 - 15.5 %   Platelets 361 150 - 400 K/uL   nRBC 0.0 0.0 - 0.2 %    Comment: Performed at [facility], [place] [address] [place], [place] [address]  Basic metabolic panel     Status: Abnormal   Collection Time: 04/18/20  5:09 AM  Result Value Ref Range   Sodium 141 135 - 145 mmol/L   Potassium 3.2 (L) 3.5 - 5.1 mmol/L    Comment: DELTA CHECK NOTED   Chloride 109 98 - 111 mmol/L   CO2 20 (L) 22 - 32 mmol/L   Glucose, Bld 116 (H) 70 - 99 mg/dL    Comment: Glucose reference range applies only to samples taken after fasting for at least 8 hours.   BUN 13 8 - 23 mg/dL   Creatinine, Ser 1.86 (H) 0.44 - 1.00 mg/dL   Calcium 8.3 (L) 8.9 - 10.3 mg/dL   GFR, Estimated 27 (L) >60 mL/min    Comment: (NOTE) Calculated using the CKD-EPI Creatinine Equation (2021)    Anion gap 12 5 - 15    Comment: Performed at [facility], [place] [address], [place], [place] [address]  CBC     Status: Abnormal   Collection Time: 04/18/20  5:09 AM  Result Value Ref Range   WBC 10.2 4.0 - 10.5 K/uL   RBC 2.74 (L) 3.87 - 5.11 MIL/uL   Hemoglobin 7.6 (L) 12.0 - 15.0 g/dL   HCT 23.6 (L) 36.0 - 46.0 %   MCV 86.1 80.0 - 100.0 fL   MCH 27.7 26.0 - 34.0 pg   MCHC 32.2 30.0 - 36.0 g/dL    RDW 15.6 (H) 11.5 - 15.5 %   Platelets 433 (H) 150 - 400 K/uL   nRBC 0.0 0.0 - 0.2 %    Comment: Performed at [facility], [place] [address] [place], [place] [address]  Lactic acid, plasma     Status: None   Collection Time: 04/18/20  5:09 AM  Result Value Ref Range   Lactic Acid, Venous 1.2 0.5 - 1.9 mmol/L    Comment: Performed at [facility], [place] [address] [place], [place] [address]    No results found.  Imaging: reviewed  A/P: Martha Ortega is an 85 y.o. female with  Sigmoid diverticulitis with small fluid collection 1 episode of hematochezia Acute blood loss anemia Chronic kidney disease Hyperlipidemia Aortic atherosclerosis Hypokalemia Probable moderate protein calorie malnutrition   The patient looks well.  Her exam is stable.  She is having some intermittent left lower quadrant pain.  She has no rebound or guarding.  Her white count has resolved.  No fever.  No tachycardia.  I explained to her that it is not uncommon to have some ongoing mild discomfort as the diverticulitis resolves.  Would recommend repeat CT scan just to make sure fluid collection is resolving Continue diet as tolerated Consider supplemental protein shakes Ambulate Continue IV antibiotic Check prealbumin in a.m.  If CT scan is stable and/or improving will give April Manson. Redmond Pulling, MD, FACS General, Bariatric, & Minimally Invasive Surgery [facility], [place]

## 2020-04-18 NOTE — Progress Notes (Signed)
PROGRESS NOTE    Martha Ortega  SNK:[id number] DOB: 03/16/1935 DOA: 04/24/2020 PCP: [name], MD    Chief Complaint  Patient presents with  . Blood In Stools  . Abdominal Pain    Brief Narrative:  This is an 85 year old female with past medical history of CVA on aspirin, CKD 4, hypertension, hyperlipidemia, diverticulitis, s/p appendectomy and hysterectomy who presented to the ED with 2 weeks of intermittent LLQ pain and blood in stool since this morning.  She was seen by her PCP on 12/13 for LLQ pain and was given Augmentin for presumed diverticulitis which she completed yesterday.  Antibiotic did not really help much.  This morning around 5 AM she had dark blood in her stool and had another bloody BM in the ED.   Subjective:   Reports liquid bloody stool at home, no bm since in the hospital, Persistent left lower quadrant pain No n/v No fever  Assessment & Plan:   Principal Problem:   Diverticulitis Active Problems:   Hyperlipidemia   CKD (chronic kidney disease), stage IV (HCC)   Aortic atherosclerosis (HCC)   Blood in stool   ABLA (acute blood loss anemia)  Sepsis presents on admission with tachycardia/tachypnea/leukocytosis/lactic acidosis/Recurrent diveticulitis complicated by sinus tract and 80 mL collection in her low paracolic gutter/GI bleed -She was hurting for 2 weeks prior to coming to the hospital, failed outpatient antibiotic Augmentin treatment -Lactic acid normalized with hydration and antibiotics, she reported having less pain -GI consulted and recommended the following: " I would not recommend colonoscopy at this time in regards to her bleeding especially if this is self limited / one time episode in the setting of diverticulitis. In fact, colonoscopy in general sounds like it may not be possible at all, patient was told previously she should never have another one due to risks. If she resolves this with course of IV antibiotics, could consider  outpatient virtual colonoscopy to re-evaluate the area and ensure no polyps / mass lesion in the area of concern. " "If she has recurrent or significant bleeding please contact us, would consider tagged RBC scan in this setting (CKD).  " -Lab are improving, however persistent left lower quadrant pain, continue IV antibiotics, case discussed with general surgery, will repeat ct ab/pel with oral contrast ( not a candidate for iv contrast due to renal function), will follow general surgery consult  Hypokalemia, replace k, check amg  Acute blood loss anemia, report blood in stool prior to admission, no bm since in the hospital -Transfuse if hemoglobin less than 7 -Repeat CBC in the morning  History of hypertension, currently blood pressure low normal in the setting of GI bleed and diverticulitis Discontinue hydralazine, decrease metoprolol with holding parameters  History of aortic atherosclerosis Hold aspirin in the setting of GI bleed/acute anemia Continue statin  CKD 4, appears at baseline, renal dosing meds    DVT prophylaxis: SCDs Start: 04/05/2020 1309   Code Status:full Family Communication: Son updated over the phone with permission on 12/24 Disposition:   Status is: Inpatient   Dispo: The patient is from: home              Anticipated d/c is to: home              Anticipated d/c date is: TBD              Patient currently not medical stable to discharge  Consultants:   GI  General surgery  Procedures:  None  Antimicrobials:    Zosyn    Objective: Vitals:   04/17/20 0546 04/17/20 1401 04/17/20 2207 04/18/20 0532  BP: 124/66 130/73 136/68 137/75  Pulse: 80 92 93 91  Resp: 16  18 16   Temp: 97.6 F (36.4 C) 98 F (36.7 C) 98.9 F (37.2 C) 98.5 F (36.9 C)  TempSrc: Oral Oral Oral Oral  SpO2: 97% 97% 99% 99%  Weight:      Height:        Intake/Output Summary (Last 24 hours) at 04/18/2020 1013 Last data filed at 04/18/2020 0600 Gross per 24 hour   Intake 795.74 ml  Output 150 ml  Net 645.74 ml   Filed Weights   04/17/2020 1333  Weight: 59.9 kg    Examination:  General exam: calm, NAD Respiratory system: Clear to auscultation. Respiratory effort normal. Cardiovascular system: S1 & S2 heard, RRR. No JVD, no murmur, No pedal edema. Gastrointestinal system: Abdomen is nondistended, soft , left lower quadrant tenderness. Normal bowel sounds heard. Central nervous system: Alert and oriented. No focal neurological deficits. Extremities: Symmetric 5 x 5 power. Skin: No rashes, lesions or ulcers Psychiatry: Judgement and insight appear normal. Mood & affect appropriate.     Data Reviewed: I have personally reviewed following labs and imaging studies  CBC: Recent Labs  Lab 04/17/2020 0910 04/18/2020 1916 04/15/20 0037 04/16/20 0531 04/17/20 0526 04/18/20 0509  WBC 23.4*  --  19.1* 15.2* 11.4* 10.2  NEUTROABS 20.7*  --   --   --   --   --   HGB 10.0* 7.5* 7.1* 7.0* 7.2* 7.6*  HCT 30.5* 22.2* 21.3* 21.5* 22.6* 23.6*  MCV 83.8  --  82.2 84.3 85.9 86.1  PLT 459*  --  288 323 361 433*    Basic Metabolic Panel: Recent Labs  Lab 04/13/2020 0910 04/15/20 0037 04/16/20 0531 04/17/20 0526 04/18/20 0509  NA 137 134* 141 140 141  K 4.9 4.4 4.8 4.0 3.2*  CL 105 105 110 109 109  CO2 21* 22 23 21* 20*  GLUCOSE 205* 178* 108* 132* 116*  BUN 22 25* 21 15 13   CREATININE 2.09* 1.81* 1.87* 1.76* 1.86*  CALCIUM 8.9 7.9* 8.2* 8.2* 8.3*    GFR: Estimated Creatinine Clearance: 18.1 mL/min (A) (by C-G formula based on SCr of 1.86 mg/dL (H)).  Liver Function Tests: Recent Labs  Lab 04/11/2020 0910  AST 26  ALT 10  ALKPHOS 61  BILITOT 1.2  PROT 6.7  ALBUMIN 2.7*    CBG: No results for input(s): GLUCAP in the last 168 hours.   Recent Results (from the past 240 hour(s))  Resp Panel by RT-PCR (Flu A&B, Covid) Nasopharyngeal Swab     Status: None   Collection Time: 04/11/2020  9:13 AM   Specimen: Nasopharyngeal Swab;  Nasopharyngeal(NP) swabs in vial transport medium  Result Value Ref Range Status   SARS Coronavirus 2 by RT PCR NEGATIVE NEGATIVE Final    Comment: (NOTE) SARS-CoV-2 target nucleic acids are NOT DETECTED.  The SARS-CoV-2 RNA is generally detectable in upper respiratory specimens during the acute phase of infection. The lowest concentration of SARS-CoV-2 viral copies this assay can detect is 138 copies/mL. A negative result does not preclude SARS-Cov-2 infection and should not be used as the sole basis for treatment or other patient management decisions. A negative result may occur with  improper specimen collection/handling, submission of specimen other than nasopharyngeal swab, presence of viral mutation(s) within the areas targeted by this assay, and inadequate  number of viral copies(<138 copies/mL). A negative result must be combined with clinical observations, patient history, and epidemiological information. The expected result is Negative.  Fact Sheet for Patients:  EntrepreneurPulse.com.au  Fact Sheet for Healthcare Providers:  IncredibleEmployment.be  This test is no t yet approved or cleared by the [place] FDA and  has been authorized for detection and/or diagnosis of SARS-CoV-2 by FDA under an Emergency Use Authorization (EUA). This EUA will remain  in effect (meaning this test can be used) for the duration of the COVID-19 declaration under Section 564(b)(1) of the Act, 21 U.S.C.section 360bbb-3(b)(1), unless the authorization is terminated  or revoked sooner.       Influenza A by PCR NEGATIVE NEGATIVE Final   Influenza B by PCR NEGATIVE NEGATIVE Final    Comment: (NOTE) The Xpert Xpress SARS-CoV-2/FLU/RSV plus assay is intended as an aid in the diagnosis of influenza from Nasopharyngeal swab specimens and should not be used as a sole basis for treatment. Nasal washings and aspirates are unacceptable for Xpert Xpress  SARS-CoV-2/FLU/RSV testing.  Fact Sheet for Patients: EntrepreneurPulse.com.au  Fact Sheet for Healthcare Providers: IncredibleEmployment.be  This test is not yet approved or cleared by the [place] FDA and has been authorized for detection and/or diagnosis of SARS-CoV-2 by FDA under an Emergency Use Authorization (EUA). This EUA will remain in effect (meaning this test can be used) for the duration of the COVID-19 declaration under Section 564(b)(1) of the Act, 21 U.S.C. section 360bbb-3(b)(1), unless the authorization is terminated or revoked.  Performed at [facility], [place] [address], [place], [place] [address]          Radiology Studies: No results found.      Scheduled Meds: . feeding supplement  1 Container Oral TID BM  . metoprolol succinate  12.5 mg Oral Daily  . potassium chloride  40 mEq Oral Once  . rosuvastatin  20 mg Oral Daily   Continuous Infusions: . piperacillin-tazobactam 2.25 g (04/18/20 0538)     LOS: 4 days   Time spent: 75mins, case discussed with general surgery Greater than 50% of this time was spent in counseling, explanation of diagnosis, planning of further management, and coordination of care.  I have personally reviewed and interpreted on  04/18/2020 daily labs,I reviewed all nursing notes, pharmacy notes, consultant notes,  vitals, pertinent old records  I have discussed plan of care as described above with RN , patient  on 04/18/2020  Voice Recognition /Dragon dictation system was used to create this note, attempts have been made to correct errors. Please contact the author with questions and/or clarifications.   Florencia Reasons, MD PhD FACP [facility]  Available via Epic secure chat 7am-7pm for nonurgent issues Please page for urgent issues To page the attending provider between 7A-7P or the covering provider during after hours 7P-7A, please log into the web site  [email or web address] and access using universal [place] password for that web site. If you do not have the password, please call the hospital operator.    04/18/2020, 10:13 AM

## 2020-04-19 DIAGNOSIS — K5792 Diverticulitis of intestine, part unspecified, without perforation or abscess without bleeding: Secondary | ICD-10-CM | POA: Diagnosis not present

## 2020-04-19 LAB — BASIC METABOLIC PANEL
Anion gap: 10 (ref 5–15)
BUN: 13 mg/dL (ref 8–23)
CO2: 20 mmol/L — ABNORMAL LOW (ref 22–32)
Calcium: 8.4 mg/dL — ABNORMAL LOW (ref 8.9–10.3)
Chloride: 110 mmol/L (ref 98–111)
Creatinine, Ser: 2.04 mg/dL — ABNORMAL HIGH (ref 0.44–1.00)
GFR, Estimated: 24 mL/min — ABNORMAL LOW (ref >60)
Glucose, Bld: 109 mg/dL — ABNORMAL HIGH (ref 70–99)
Potassium: 4.2 mmol/L (ref 3.5–5.1)
Sodium: 140 mmol/L (ref 135–145)

## 2020-04-19 LAB — CBC
HCT: 22.9 % — ABNORMAL LOW (ref 36.0–46.0)
Hemoglobin: 7.4 g/dL — ABNORMAL LOW (ref 12.0–15.0)
MCH: 27.1 pg (ref 26.0–34.0)
MCHC: 32.3 g/dL (ref 30.0–36.0)
MCV: 83.9 fL (ref 80.0–100.0)
Platelets: 434 10*3/uL — ABNORMAL HIGH (ref 150–400)
RBC: 2.73 MIL/uL — ABNORMAL LOW (ref 3.87–5.11)
RDW: 15.5 % (ref 11.5–15.5)
WBC: 12.9 10*3/uL — ABNORMAL HIGH (ref 4.0–10.5)
nRBC: 0 % (ref 0.0–0.2)

## 2020-04-19 LAB — PREALBUMIN: Prealbumin: 7.2 mg/dL — ABNORMAL LOW (ref 18–38)

## 2020-04-19 LAB — MAGNESIUM: Magnesium: 1.7 mg/dL (ref 1.7–2.4)

## 2020-04-19 MED ORDER — METOPROLOL SUCCINATE ER 25 MG PO TB24
25.0000 mg | ORAL_TABLET | Freq: Every day | ORAL | Status: DC
  Administered 2020-04-20 – 2020-04-22 (×3): 25 mg via ORAL
  Filled 2020-04-19 (×3): qty 1

## 2020-04-19 MED ORDER — MAGNESIUM OXIDE 400 (241.3 MG) MG PO TABS
400.0000 mg | ORAL_TABLET | Freq: Every day | ORAL | Status: AC
  Administered 2020-04-19 – 2020-04-20 (×2): 400 mg via ORAL
  Filled 2020-04-19 (×2): qty 1

## 2020-04-19 MED ORDER — POLYETHYLENE GLYCOL 3350 17 G PO PACK
17.0000 g | PACK | Freq: Every day | ORAL | Status: DC
  Administered 2020-04-19: 11:00:00 17 g via ORAL
  Filled 2020-04-19: qty 1

## 2020-04-19 MED ORDER — DOCUSATE SODIUM 100 MG PO CAPS
100.0000 mg | ORAL_CAPSULE | Freq: Two times a day (BID) | ORAL | Status: DC
Start: 2020-04-19 — End: 2020-04-21
  Administered 2020-04-19 – 2020-04-21 (×5): 100 mg via ORAL
  Filled 2020-04-19 (×5): qty 1

## 2020-04-19 NOTE — Progress Notes (Signed)
PROGRESS NOTE    Martha Ortega  HDQ:[id number] DOB: 1934/08/30 DOA: 04/02/2020 PCP: [name], MD    Chief Complaint  Patient presents with  . Blood In Stools  . Abdominal Pain    Brief Narrative:  This is an 85 year old female with past medical history of CVA on aspirin, CKD 4, hypertension, hyperlipidemia, diverticulitis, s/p appendectomy and hysterectomy who presented to the ED with 2 weeks of intermittent LLQ pain and blood in stool since this morning.  She was seen by her PCP on 12/13 for LLQ pain and was given Augmentin for presumed diverticulitis which she completed yesterday.  Antibiotic did not really help much.  This morning around 5 AM she had dark blood in her stool and had another bloody BM in the ED.   Subjective:   Had one bm today ,report dark brown, no fresh blood Persistent left lower quadrant pain No n/v No fever  Assessment & Plan:   Principal Problem:   Diverticulitis Active Problems:   Hyperlipidemia   CKD (chronic kidney disease), stage IV (HCC)   Aortic atherosclerosis (HCC)   Blood in stool   ABLA (acute blood loss anemia)  Sepsis presents on admission with tachycardia/tachypnea/leukocytosis/lactic acidosis/Recurrent diveticulitis complicated by sinus tract and 80 mL collection in her low paracolic gutter/GI bleed -She was hurting for 2 weeks prior to coming to the hospital, failed outpatient antibiotic Augmentin treatment --GI consulted and recommended the following: " I would not recommend colonoscopy at this time in regards to her bleeding especially if this is self limited / one time episode in the setting of diverticulitis. In fact, colonoscopy in general sounds like it may not be possible at all, patient was told previously she should never have another one due to risks. If she resolves this with course of IV antibiotics, could consider outpatient virtual colonoscopy to re-evaluate the area and ensure no polyps / mass lesion in the area  of concern. " "If she has recurrent or significant bleeding please contact us, would consider tagged RBC scan in this setting (CKD).  " - - general surgery consulted due to persistent abdominal pain who recommended repeat ct ab/pel with oral contrast ( not a candidate for iv contrast due to renal function) -Repeat CT abdomen pelvis on December 25 personally reviewed showed sigmoid diverticulosis with marked wall thickening and pericolic infiltrative changes at proximal sigmoid colon, short sinus tract extending from this site of diverticulitis into the mesocolon, containing contrast but no air, no evidence of abscess or free intraperitoneal perforation -Continue IV antibiotics , WBC overall improving but fluctuating , will follow general surgery recommendation  Hypokalemia /hypomagnesemia, replaced, monitor  Acute blood loss anemia, report blood in stool prior to admission, no overt bleeding in the hospital -Transfuse if hemoglobin less than 7 -Repeat CBC in the morning  History of hypertension,   blood pressure low normal on presentation  in the setting of GI bleed and diverticulitis Hoe meds  Hydralazine and cozaar held since admission On reduced dose of metoprolol with holding parameters  History of aortic atherosclerosis Hold aspirin in the setting of GI bleed/acute anemia Continue statin  CKD 4, appears at baseline, renal dosing meds Consider discontinue cozaar at  discharge   DVT prophylaxis: SCDs Start: 03/28/2020 1309   Code Status:full Family Communication: Son updated over the phone with permission on 12/24 Disposition:   Status is: Inpatient   Dispo: The patient is from: home  Anticipated d/c is to: home              Anticipated d/c date is: 24-48hrs , pending ab pain /alb improvement              Patient currently not medical stable to discharge  Consultants:   GI  General surgery  Procedures:   None  Antimicrobials:     Zosyn    Objective: Vitals:   04/18/20 1331 04/18/20 2140 04/19/20 0527 04/19/20 1224  BP: (!) 165/77 (!) 149/74 136/79 (!) 149/76  Pulse: 92 92 89 97  Resp: 15 16 17 18   Temp: 97.7 F (36.5 C) 98.3 F (36.8 C) (!) 97.2 F (36.2 C) 98.5 F (36.9 C)  TempSrc: Oral Oral Oral Oral  SpO2: 96% 99% 98% 98%  Weight:      Height:        Intake/Output Summary (Last 24 hours) at 04/19/2020 1346 Last data filed at 04/19/2020 0200 Gross per 24 hour  Intake 114.29 ml  Output --  Net 114.29 ml   Filed Weights   03/26/2020 1333  Weight: 59.9 kg    Examination:  General exam: calm, NAD Respiratory system: Clear to auscultation. Respiratory effort normal. Cardiovascular system: S1 & S2 heard, RRR. No JVD, no murmur, No pedal edema. Gastrointestinal system: Abdomen is nondistended, soft , left lower quadrant tenderness. Normal bowel sounds heard. Central nervous system: Alert and oriented. No focal neurological deficits. Extremities: Symmetric 5 x 5 power. Skin: No rashes, lesions or ulcers Psychiatry: Judgement and insight appear normal. Mood & affect appropriate.     Data Reviewed: I have personally reviewed following labs and imaging studies  CBC: Recent Labs  Lab 04/19/2020 0910 03/29/2020 1916 04/15/20 0037 04/16/20 0531 04/17/20 0526 04/18/20 0509 04/19/20 0505  WBC 23.4*  --  19.1* 15.2* 11.4* 10.2 12.9*  NEUTROABS 20.7*  --   --   --   --   --   --   HGB 10.0*   < > 7.1* 7.0* 7.2* 7.6* 7.4*  HCT 30.5*   < > 21.3* 21.5* 22.6* 23.6* 22.9*  MCV 83.8  --  82.2 84.3 85.9 86.1 83.9  PLT 459*  --  288 323 361 433* 434*   < > = values in this interval not displayed.    Basic Metabolic Panel: Recent Labs  Lab 04/15/20 0037 04/16/20 0531 04/17/20 0526 04/18/20 0509 04/19/20 0505  NA 134* 141 140 141 140  K 4.4 4.8 4.0 3.2* 4.2  CL 105 110 109 109 110  CO2 22 23 21* 20* 20*  GLUCOSE 178* 108* 132* 116* 109*  BUN 25* 21 15 13 13   CREATININE 1.81* 1.87* 1.76*  1.86* 2.04*  CALCIUM 7.9* 8.2* 8.2* 8.3* 8.4*  MG  --   --   --   --  1.7    GFR: Estimated Creatinine Clearance: 16.5 mL/min (A) (by C-G formula based on SCr of 2.04 mg/dL (H)).  Liver Function Tests: Recent Labs  Lab 03/30/2020 0910  AST 26  ALT 10  ALKPHOS 61  BILITOT 1.2  PROT 6.7  ALBUMIN 2.7*    CBG: No results for input(s): GLUCAP in the last 168 hours.   Recent Results (from the past 240 hour(s))  Resp Panel by RT-PCR (Flu A&B, Covid) Nasopharyngeal Swab     Status: None   Collection Time: 03/26/2020  9:13 AM   Specimen: Nasopharyngeal Swab; Nasopharyngeal(NP) swabs in vial transport medium  Result Value Ref Range Status  SARS Coronavirus 2 by RT PCR NEGATIVE NEGATIVE Final    Comment: (NOTE) SARS-CoV-2 target nucleic acids are NOT DETECTED.  The SARS-CoV-2 RNA is generally detectable in upper respiratory specimens during the acute phase of infection. The lowest concentration of SARS-CoV-2 viral copies this assay can detect is 138 copies/mL. A negative result does not preclude SARS-Cov-2 infection and should not be used as the sole basis for treatment or other patient management decisions. A negative result may occur with  improper specimen collection/handling, submission of specimen other than nasopharyngeal swab, presence of viral mutation(s) within the areas targeted by this assay, and inadequate number of viral copies(<138 copies/mL). A negative result must be combined with clinical observations, patient history, and epidemiological information. The expected result is Negative.  Fact Sheet for Patients:  EntrepreneurPulse.com.au  Fact Sheet for Healthcare Providers:  IncredibleEmployment.be  This test is no t yet approved or cleared by the [place] FDA and  has been authorized for detection and/or diagnosis of SARS-CoV-2 by FDA under an Emergency Use Authorization (EUA). This EUA will remain  in effect (meaning  this test can be used) for the duration of the COVID-19 declaration under Section 564(b)(1) of the Act, 21 U.S.C.section 360bbb-3(b)(1), unless the authorization is terminated  or revoked sooner.       Influenza A by PCR NEGATIVE NEGATIVE Final   Influenza B by PCR NEGATIVE NEGATIVE Final    Comment: (NOTE) The Xpert Xpress SARS-CoV-2/FLU/RSV plus assay is intended as an aid in the diagnosis of influenza from Nasopharyngeal swab specimens and should not be used as a sole basis for treatment. Nasal washings and aspirates are unacceptable for Xpert Xpress SARS-CoV-2/FLU/RSV testing.  Fact Sheet for Patients: EntrepreneurPulse.com.au  Fact Sheet for Healthcare Providers: IncredibleEmployment.be  This test is not yet approved or cleared by the [place] FDA and has been authorized for detection and/or diagnosis of SARS-CoV-2 by FDA under an Emergency Use Authorization (EUA). This EUA will remain in effect (meaning this test can be used) for the duration of the COVID-19 declaration under Section 564(b)(1) of the Act, 21 U.S.C. section 360bbb-3(b)(1), unless the authorization is terminated or revoked.  Performed at [facility], [place] [address] [place], [place] [address]          Radiology Studies: CT ABDOMEN PELVIS WO CONTRAST  Result Date: 04/18/2020 CLINICAL DATA:  LEFT lower quadrant pain since Tuesday 04/23/2020, constipation, blood in stool, unintentional weight loss for a month, suspected complicated diverticulitis EXAM: CT ABDOMEN AND PELVIS WITHOUT CONTRAST TECHNIQUE: Multidetector CT imaging of the abdomen and pelvis was performed following the standard protocol without IV contrast. Neither oral nor intravenous contrast sagittal and coronal MPR images reconstructed from axial data set. Were utilized. COMPARISON:  04/05/2020 FINDINGS: Lower chest: Subsegmental atelectasis LEFT lower lobe Hepatobiliary: Several  liver lesions are again identified, largest 3.0 x 2.5 cm posterior RIGHT lobe image 17 and superiorly lateral segment LEFT lobe 2.9 x 2.2 cm image 17. These measure fluid attenuation and likely represent cysts. Gallbladder distended without calcification. Pancreas: Normal appearance Spleen: Normal appearance Adrenals/Urinary Tract: Adrenal glands normal appearance. Small exophytic cyst LEFT kidney 11 mm diameter. Kidneys, ureters, and bladder otherwise normal appearance Stomach/Bowel: Appendix not visualized. Stomach and small bowel loops normal appearance. Dilated colon from cecum through distal descending colon, containing contrast. Transition from dilated to nondilated colon at the proximal sigmoid colon with marked wall thickening and pericolic infiltrative changes likely representing acute diverticulitis though requiring follow-up colonoscopy to exclude underlying neoplasm. Numerous  sigmoid diverticula. No extraluminal gas or discrete abscess collection. At the site of gas beyond the colon on the previous exam which appear to represent a sinus tract, a small collection of extraluminal contrast is seen but no air is identified. Vascular/Lymphatic: Scattered pelvic phleboliths. Atherosclerotic calcifications aorta and iliac arteries without aneurysm. No adenopathy, few normal sized mesenteric nodes noted. Reproductive: Uterus surgically absent. Nonvisualization of ovaries. Other: Free fluid perihepatic, minimally perisplenic and both pericolic gutters. No free air. No hernia. Musculoskeletal: Osseous demineralization. Mild degenerative changes and scoliosis of thoracolumbar spine. IMPRESSION: Sigmoid diverticulosis with marked wall thickening and pericolic infiltrative changes at the proximal sigmoid colon likely representing acute diverticulitis. Follow-up colonoscopy to exclude underlying colonic neoplasm. Again identified short sinus tract extending from the site of diverticulitis into the mesocolon,  containing contrast but no air. No evidence of abscess or free intraperitoneal perforation. Small amount of ascites. Hepatic and LEFT renal cysts. Aortic Atherosclerosis (ICD10-I70.0). Electronically Signed   By: Lavonia Dana M.D.   On: 04/18/2020 15:30        Scheduled Meds: . docusate sodium  100 mg Oral BID  . feeding supplement  1 Container Oral TID BM  . magnesium oxide  400 mg Oral Daily  . [START ON 04/20/2020] metoprolol succinate  25 mg Oral Daily  . polyethylene glycol  17 g Oral Daily  . rosuvastatin  20 mg Oral Daily   Continuous Infusions: . piperacillin-tazobactam 2.25 g (04/19/20 1223)     LOS: 5 days   Time spent: 27mins Greater than 50% of this time was spent in counseling, explanation of diagnosis, planning of further management, and coordination of care.  I have personally reviewed and interpreted on  04/19/2020 daily labs,I reviewed all nursing notes, pharmacy notes, consultant notes,  vitals, pertinent old records  I have discussed plan of care as described above with RN , patient  on 04/19/2020  Voice Recognition /Dragon dictation system was used to create this note, attempts have been made to correct errors. Please contact the author with questions and/or clarifications.   Florencia Reasons, MD PhD FACP [facility]  Available via Epic secure chat 7am-7pm for nonurgent issues Please page for urgent issues To page the attending provider between 7A-7P or the covering provider during after hours 7P-7A, please log into the web site [email or web address] and access using universal [place] password for that web site. If you do not have the password, please call the hospital operator.    04/19/2020, 1:46 PM

## 2020-04-19 NOTE — Progress Notes (Signed)
Patient ID: Martha Ortega, female   DOB: 05/31/1934, 85 y.o.   MRN: [id number]   Acute Care Surgery Service Progress Note:    Chief Complaint/Subjective: States pain is better. Still with some occasional cramps at time Finally had a BM this am with miralax - reports dark but no obvious blood per pt.  Spent time in chair Family member at [facility]  Objective: Vital signs in last 24 hours: Temp:  [97.2 F (36.2 C)-98.3 F (36.8 C)] 97.2 F (36.2 C) (12/26 0527) Pulse Rate:  [89-92] 89 (12/26 0527) Resp:  [15-17] 17 (12/26 0527) BP: (136-165)/(74-79) 136/79 (12/26 0527) SpO2:  [96 %-99 %] 98 % (12/26 0527) Last BM Date: 04/19/20  Intake/Output from previous day: 12/25 0701 - 12/26 0700 In: 114.3 [IV Piggyback:114.3] Out: -  Intake/Output this shift: No intake/output data recorded.  Lungs: cta, nonlabored  Cardiovascular: reg  Abd: soft, nd, min TTP LLQ  Extremities: no edema, +SCDs  Neuro: alert, nonfocal  Lab Results: CBC  Recent Labs    04/18/20 0509 04/19/20 0505  WBC 10.2 12.9*  HGB 7.6* 7.4*  HCT 23.6* 22.9*  PLT 433* 434*   BMET Recent Labs    04/18/20 0509 04/19/20 0505  NA 141 140  K 3.2* 4.2  CL 109 110  CO2 20* 20*  GLUCOSE 116* 109*  BUN 13 13  CREATININE 1.86* 2.04*  CALCIUM 8.3* 8.4*   LFT Hepatic Function Latest Ref Rng & Units 04/20/2020 04/06/2020 04/06/2020  Total Protein 6.5 - 8.1 g/dL 6.7 - 7.7  Albumin 3.5 - 5.0 g/dL 2.7(L) 3.7 3.7  AST 15 - 41 U/L 26 - 19  ALT 0 - 44 U/L 10 - 7  Alk Phosphatase 38 - 126 U/L 61 - 55  Total Bilirubin 0.3 - 1.2 mg/dL 1.2 - 0.7  Bilirubin, Direct 0.0 - 0.3 mg/dL - - 0.2   PT/INR No results for input(s): LABPROT, INR in the last 72 hours. ABG No results for input(s): PHART, HCO3 in the last 72 hours.  Invalid input(s): PCO2, PO2  Studies/Results:  Anti-infectives: Anti-infectives (From admission, onward)   Start     Dose/Rate Route Frequency Ordered Stop   04/16/2020 1200   piperacillin-tazobactam (ZOSYN) IVPB 2.25 g        2.25 g 100 mL/hr over 30 Minutes Intravenous Every 8 hours 03/25/2020 1138        Medications: Scheduled Meds: . docusate sodium  100 mg Oral BID  . feeding supplement  1 Container Oral TID BM  . metoprolol succinate  12.5 mg Oral Daily  . polyethylene glycol  17 g Oral Daily  . rosuvastatin  20 mg Oral Daily   Continuous Infusions: . piperacillin-tazobactam 2.25 g (04/19/20 0452)   PRN Meds:.acetaminophen **OR** acetaminophen, HYDROcodone-acetaminophen, morphine injection, ondansetron **OR** ondansetron (ZOFRAN) IV  Assessment/Plan: Patient Active Problem List   Diagnosis Date Noted  . Diverticulitis 04/05/2020  . Aortic atherosclerosis ([place]) 04/01/2020  . ABLA (acute blood loss anemia) 04/13/2020  . Blood in stool   . Abdominal pain 04/06/2020  . CKD (chronic kidney disease), stage IV ([place]) 04/06/2020  . Mild protein-calorie malnutrition ([place]) 04/06/2020  . Osteopenia 07/26/2015  . Estrogen deficiency 06/23/2015  . History of CVA (cerebrovascular accident) 03/02/2015  . Encounter for Medicare annual wellness exam 09/23/2013  . Spinal stenosis of lumbar region 09/23/2013  . GERD (gastroesophageal reflux disease) 12/12/2011  . Renal insufficiency 11/26/2008  . ALLERGIC RHINITIS 09/10/2007  . Hyperlipidemia 09/08/2006  . Essential hypertension 09/08/2006  .  POSTNASAL DRIP SYNDROME 09/08/2006  . DIVERTICULITIS, HX OF 09/08/2006   Martha Ortega is an 85 y.o. female with  Sigmoid diverticulitis with small fluid collection 1 episode of hematochezia Acute blood loss anemia Chronic kidney disease Hyperlipidemia Aortic atherosclerosis Hypokalemia Probable moderate protein calorie malnutrition - prealbumin 7.2 (12/26)  Repeat CT yesterday was essentially stable - tiny fluid collection; not increased in size.   I would continue soft diet and IV abx Would like to see her have another BM before discharge So would probably  keep today Will add colace and miralax Encourage protein shakes given malnutrition  See consult note regarding his colonoscopy history (attempted colonoscopy in 2007 aborted due to narrowing in sigmoid, BE shortly thereafter also showed narrowing, from what I can tell had no addl f/u regarding that area of narrowing but pt does not give history of thin stools, bloating, etc)    Ideally needs evaluation of colon (colonscopy, virtual colonoscopy) - rec outpt GI followup Disposition:  LOS: 5 days    Leighton Ruff. Redmond Pulling, MD, FACS General, Bariatric, & Minimally Invasive Surgery [phone] [facility]

## 2020-04-20 ENCOUNTER — Telehealth: Payer: Self-pay

## 2020-04-20 DIAGNOSIS — K5792 Diverticulitis of intestine, part unspecified, without perforation or abscess without bleeding: Secondary | ICD-10-CM | POA: Diagnosis not present

## 2020-04-20 LAB — BASIC METABOLIC PANEL
Anion gap: 11 (ref 5–15)
BUN: 15 mg/dL (ref 8–23)
CO2: 19 mmol/L — ABNORMAL LOW (ref 22–32)
Calcium: 8.2 mg/dL — ABNORMAL LOW (ref 8.9–10.3)
Chloride: 109 mmol/L (ref 98–111)
Creatinine, Ser: 1.96 mg/dL — ABNORMAL HIGH (ref 0.44–1.00)
GFR, Estimated: 25 mL/min — ABNORMAL LOW (ref >60)
Glucose, Bld: 104 mg/dL — ABNORMAL HIGH (ref 70–99)
Potassium: 3.9 mmol/L (ref 3.5–5.1)
Sodium: 139 mmol/L (ref 135–145)

## 2020-04-20 LAB — CBC WITH DIFFERENTIAL/PLATELET
Abs Immature Granulocytes: 0.07 10*3/uL (ref 0.00–0.07)
Basophils Absolute: 0 10*3/uL (ref 0.0–0.1)
Basophils Relative: 0 %
Eosinophils Absolute: 0.1 10*3/uL (ref 0.0–0.5)
Eosinophils Relative: 0 %
HCT: 23.1 % — ABNORMAL LOW (ref 36.0–46.0)
Hemoglobin: 7.4 g/dL — ABNORMAL LOW (ref 12.0–15.0)
Immature Granulocytes: 1 %
Lymphocytes Relative: 17 %
Lymphs Abs: 2.1 10*3/uL (ref 0.7–4.0)
MCH: 27.2 pg (ref 26.0–34.0)
MCHC: 32 g/dL (ref 30.0–36.0)
MCV: 84.9 fL (ref 80.0–100.0)
Monocytes Absolute: 1.2 10*3/uL — ABNORMAL HIGH (ref 0.1–1.0)
Monocytes Relative: 10 %
Neutro Abs: 8.9 10*3/uL — ABNORMAL HIGH (ref 1.7–7.7)
Neutrophils Relative %: 72 %
Platelets: 472 10*3/uL — ABNORMAL HIGH (ref 150–400)
RBC: 2.72 MIL/uL — ABNORMAL LOW (ref 3.87–5.11)
RDW: 15.8 % — ABNORMAL HIGH (ref 11.5–15.5)
WBC: 12.3 10*3/uL — ABNORMAL HIGH (ref 4.0–10.5)
nRBC: 0 % (ref 0.0–0.2)

## 2020-04-20 LAB — IRON AND TIBC
Iron: 24 ug/dL — ABNORMAL LOW (ref 28–170)
Saturation Ratios: 16 % (ref 10.4–31.8)
TIBC: 152 ug/dL — ABNORMAL LOW (ref 250–450)
UIBC: 128 ug/dL

## 2020-04-20 LAB — FERRITIN: Ferritin: 204 ng/mL (ref 11–307)

## 2020-04-20 LAB — MAGNESIUM: Magnesium: 1.8 mg/dL (ref 1.7–2.4)

## 2020-04-20 MED ORDER — POLYETHYLENE GLYCOL 3350 17 G PO PACK
17.0000 g | PACK | Freq: Two times a day (BID) | ORAL | Status: DC
  Administered 2020-04-20 – 2020-04-22 (×5): 17 g via ORAL
  Filled 2020-04-20 (×5): qty 1

## 2020-04-20 MED ORDER — ENSURE ENLIVE PO LIQD
237.0000 mL | Freq: Two times a day (BID) | ORAL | Status: DC
  Administered 2020-04-20 – 2020-04-21 (×2): 237 mL via ORAL

## 2020-04-20 MED ORDER — SODIUM CHLORIDE 0.9 % IV SOLN
510.0000 mg | Freq: Once | INTRAVENOUS | Status: AC
  Administered 2020-04-20: 14:00:00 510 mg via INTRAVENOUS
  Filled 2020-04-20: qty 510

## 2020-04-20 NOTE — Telephone Encounter (Signed)
Martha Ortega with [facility] is calling to confirm that Dr Glori Bickers would be willing to place referral for PT and OT as pt is being discharged from the hospital tomorrow...Marland Kitchen please advise

## 2020-04-20 NOTE — Telephone Encounter (Signed)
Usually =the hospital attending does the initial order since they know what needs she has (since I do not see patients in the hospital) and then I take over orders/etc once they start  Let me know what they need

## 2020-04-20 NOTE — Progress Notes (Signed)
PROGRESS NOTE    Martha Ortega  IDP:[id number] DOB: 1935-01-19 DOA: 03/29/2020 PCP: [name], MD   Chief Complain: Abdominal pain  Brief Narrative: Patient is 85 year old female with history of nonhemorrhagic CVA, CKD stage IV, hypertension, hyperlipidemia, diverticulitis, status post appendicectomy and hysterectomy who presented to the emergency room with 2 weeks history of intermittent left lower quadrant pain, blood in the stool.  She was started on Augmentin as an outpatient for presumed diverticulitis by her PCP but it did not help.  On presentation, she was tachycardic and tachypneic, had leukocytosis, lactic acidosis.  Imaging showed recurrent descending colon diverticulitis complicated by sinus tract and collection in the lower paracolic catheter.  She will start on p.o. antibiotics, GI and general surgery consulted.  Assessment & Plan:   Principal Problem:   Diverticulitis Active Problems:   Hyperlipidemia   CKD (chronic kidney disease), stage IV (HCC)   Aortic atherosclerosis (HCC)   Blood in stool   ABLA (acute blood loss anemia)   Sepsis: Presented with tachycardia, tachypnea, tachycardia, leukocytosis.  Sepsis physiology improved.  Continue current antibiotics.  Cultures negative so far.  Leukocytosis is improving.  Recurrent diverticulitis: Complicated by sinus tract and fluid collection in the lower paracolic gutter.  General surgery following.  Repeat CT abdomen/pelvis on December 25 showed  sigmoid diverticulosis with marked wall thickening and pericolic inflammatory changes at the proximal sigmoid colon, no evidence of abscess or free intraperitoneal perforation.  Clinically she is improving.  Leukocytosis has resolved.  General surgery recommended to continue IV antibiotics for 1 more day. GI was also consulted during this admission who did not recommend colonoscopy due to acute diverticulitis.  She needs to follow-up with GI as an outpatient for colonoscopy in  6 weeks.  Hypokalemia/hypomagnesemia: Supplemented and corrected  Normocytic anemia/suspected GI bleed: Hemoglobin in the range of 7, stable.  Will check iron studies.  She was reporting hematochezia at home which was most likely associated with diverticulitis.  GI was consulted during this admission, no plan for intervention.  She will follow-up with GI as an outpatient  Hypertension: Continue current medications.  Monitor blood pressure.  On Cozaar, hydralazine, metoprolol at home  History of nonhemorrhagic CVA: On aspirin, statin at home.  Will discontinue aspirin in the setting of GI bleed/acute anemia/possible GI bleed.  CKD stage IV: Currently kidney function at baseline.  Cozaar to be discontinued on discharge.    Nutrition Problem: Increased nutrient needs Etiology: acute illness (diverticulitis)      DVT prophylaxis:SCD Code Status: Full Family Communication: None at bedside Status is: Inpatient  Remains inpatient appropriate because:Inpatient level of care appropriate due to severity of illness   Dispo: The patient is from: Home              Anticipated d/c is to: Home              Anticipated d/c date is: 1 day              Patient currently is not medically stable to d/c.      Consultants: Surgery  Procedures:None  Antimicrobials:  Anti-infectives (From admission, onward)   Start     Dose/Rate Route Frequency Ordered Stop   04/15/2020 1200  piperacillin-tazobactam (ZOSYN) IVPB 2.25 g        2.25 g 100 mL/hr over 30 Minutes Intravenous Every 8 hours 04/08/2020 1138        Subjective: Patient seen and examined at the bedside this mrng.Feeels better today.Very  eager to go home, No bowel movement yet. Still has some LLQ discomfort   Objective: Vitals:   04/19/20 2116 04/20/20 0520 04/20/20 1013 04/20/20 1013  BP: 133/81 (!) 142/56 136/69 136/69  Pulse: (!) 104 96 99 99  Resp: 20 16  16   Temp: 98.8 F (37.1 C) 99 F (37.2 C)    TempSrc: Oral Oral     SpO2: 98% 100%    Weight:      Height:        Intake/Output Summary (Last 24 hours) at 04/20/2020 1045 Last data filed at 04/20/2020 0930 Gross per 24 hour  Intake 680.1 ml  Output --  Net 680.1 ml   Filed Weights   04/03/2020 1333  Weight: 59.9 kg    Examination:  General exam: overall comfortable,weak HEENT:PERRL,Oral mucosa moist, Ear/Nose normal on gross exam Respiratory system: Bilateral equal air entry, normal vesicular breath sounds, no wheezes or crackles  Cardiovascular system: S1 & S2 heard, RRR. No JVD, murmurs, rubs, gallops or clicks. No pedal edema. Gastrointestinal system: Abdomen is nondistended, soft and has some LLQ tenderness. No organomegaly or masses felt. Normal bowel sounds heard. Central nervous system: Alert and oriented. No focal neurological deficits. Extremities: No edema, no clubbing ,no cyanosis Skin: No rashes, lesions or ulcers,no icterus ,no pallor    Data Reviewed: I have personally reviewed following labs and imaging studies  CBC: Recent Labs  Lab 04/04/2020 0910 04/07/2020 1916 04/16/20 0531 04/17/20 0526 04/18/20 0509 04/19/20 0505 04/20/20 0417  WBC 23.4*   < > 15.2* 11.4* 10.2 12.9* 12.3*  NEUTROABS 20.7*  --   --   --   --   --  8.9*  HGB 10.0*   < > 7.0* 7.2* 7.6* 7.4* 7.4*  HCT 30.5*   < > 21.5* 22.6* 23.6* 22.9* 23.1*  MCV 83.8   < > 84.3 85.9 86.1 83.9 84.9  PLT 459*   < > 323 361 433* 434* 472*   < > = values in this interval not displayed.   Basic Metabolic Panel: Recent Labs  Lab 04/16/20 0531 04/17/20 0526 04/18/20 0509 04/19/20 0505 04/20/20 0417  NA 141 140 141 140 139  K 4.8 4.0 3.2* 4.2 3.9  CL 110 109 109 110 109  CO2 23 21* 20* 20* 19*  GLUCOSE 108* 132* 116* 109* 104*  BUN 21 15 13 13 15   CREATININE 1.87* 1.76* 1.86* 2.04* 1.96*  CALCIUM 8.2* 8.2* 8.3* 8.4* 8.2*  MG  --   --   --  1.7 1.8   GFR: Estimated Creatinine Clearance: 17.2 mL/min (A) (by C-G formula based on SCr of 1.96 mg/dL (H)). Liver  Function Tests: Recent Labs  Lab 04/19/2020 0910  AST 26  ALT 10  ALKPHOS 61  BILITOT 1.2  PROT 6.7  ALBUMIN 2.7*   No results for input(s): LIPASE, AMYLASE in the last 168 hours. No results for input(s): AMMONIA in the last 168 hours. Coagulation Profile: Recent Labs  Lab 04/16/20 0531  INR 1.5*   Cardiac Enzymes: No results for input(s): CKTOTAL, CKMB, CKMBINDEX, TROPONINI in the last 168 hours. BNP (last 3 results) No results for input(s): PROBNP in the last 8760 hours. HbA1C: No results for input(s): HGBA1C in the last 72 hours. CBG: No results for input(s): GLUCAP in the last 168 hours. Lipid Profile: No results for input(s): CHOL, HDL, LDLCALC, TRIG, CHOLHDL, LDLDIRECT in the last 72 hours. Thyroid Function Tests: No results for input(s): TSH, T4TOTAL, FREET4, T3FREE, THYROIDAB in the  last 72 hours. Anemia Panel: No results for input(s): VITAMINB12, FOLATE, FERRITIN, TIBC, IRON, RETICCTPCT in the last 72 hours. Sepsis Labs: Recent Labs  Lab 04/08/2020 0910 04/23/2020 1253 04/20/2020 1541 04/18/20 0509  LATICACIDVEN 3.6* 5.1* 1.4 1.2    Recent Results (from the past 240 hour(s))  Resp Panel by RT-PCR (Flu A&B, Covid) Nasopharyngeal Swab     Status: None   Collection Time: 04/13/2020  9:13 AM   Specimen: Nasopharyngeal Swab; Nasopharyngeal(NP) swabs in vial transport medium  Result Value Ref Range Status   SARS Coronavirus 2 by RT PCR NEGATIVE NEGATIVE Final    Comment: (NOTE) SARS-CoV-2 target nucleic acids are NOT DETECTED.  The SARS-CoV-2 RNA is generally detectable in upper respiratory specimens during the acute phase of infection. The lowest concentration of SARS-CoV-2 viral copies this assay can detect is 138 copies/mL. A negative result does not preclude SARS-Cov-2 infection and should not be used as the sole basis for treatment or other patient management decisions. A negative result may occur with  improper specimen collection/handling, submission of  specimen other than nasopharyngeal swab, presence of viral mutation(s) within the areas targeted by this assay, and inadequate number of viral copies(<138 copies/mL). A negative result must be combined with clinical observations, patient history, and epidemiological information. The expected result is Negative.  Fact Sheet for Patients:  EntrepreneurPulse.com.au  Fact Sheet for Healthcare Providers:  IncredibleEmployment.be  This test is no t yet approved or cleared by the [place] FDA and  has been authorized for detection and/or diagnosis of SARS-CoV-2 by FDA under an Emergency Use Authorization (EUA). This EUA will remain  in effect (meaning this test can be used) for the duration of the COVID-19 declaration under Section 564(b)(1) of the Act, 21 U.S.C.section 360bbb-3(b)(1), unless the authorization is terminated  or revoked sooner.       Influenza A by PCR NEGATIVE NEGATIVE Final   Influenza B by PCR NEGATIVE NEGATIVE Final    Comment: (NOTE) The Xpert Xpress SARS-CoV-2/FLU/RSV plus assay is intended as an aid in the diagnosis of influenza from Nasopharyngeal swab specimens and should not be used as a sole basis for treatment. Nasal washings and aspirates are unacceptable for Xpert Xpress SARS-CoV-2/FLU/RSV testing.  Fact Sheet for Patients: EntrepreneurPulse.com.au  Fact Sheet for Healthcare Providers: IncredibleEmployment.be  This test is not yet approved or cleared by the [place] FDA and has been authorized for detection and/or diagnosis of SARS-CoV-2 by FDA under an Emergency Use Authorization (EUA). This EUA will remain in effect (meaning this test can be used) for the duration of the COVID-19 declaration under Section 564(b)(1) of the Act, 21 U.S.C. section 360bbb-3(b)(1), unless the authorization is terminated or revoked.  Performed at [facility], [place]  [address] [place], [place] [address]          Radiology Studies: CT ABDOMEN PELVIS WO CONTRAST  Result Date: 04/18/2020 CLINICAL DATA:  LEFT lower quadrant pain since Tuesday 04/07/2020, constipation, blood in stool, unintentional weight loss for a month, suspected complicated diverticulitis EXAM: CT ABDOMEN AND PELVIS WITHOUT CONTRAST TECHNIQUE: Multidetector CT imaging of the abdomen and pelvis was performed following the standard protocol without IV contrast. Neither oral nor intravenous contrast sagittal and coronal MPR images reconstructed from axial data set. Were utilized. COMPARISON:  04/03/2020 FINDINGS: Lower chest: Subsegmental atelectasis LEFT lower lobe Hepatobiliary: Several liver lesions are again identified, largest 3.0 x 2.5 cm posterior RIGHT lobe image 17 and superiorly lateral segment LEFT lobe 2.9 x 2.2 cm image  17. These measure fluid attenuation and likely represent cysts. Gallbladder distended without calcification. Pancreas: Normal appearance Spleen: Normal appearance Adrenals/Urinary Tract: Adrenal glands normal appearance. Small exophytic cyst LEFT kidney 11 mm diameter. Kidneys, ureters, and bladder otherwise normal appearance Stomach/Bowel: Appendix not visualized. Stomach and small bowel loops normal appearance. Dilated colon from cecum through distal descending colon, containing contrast. Transition from dilated to nondilated colon at the proximal sigmoid colon with marked wall thickening and pericolic infiltrative changes likely representing acute diverticulitis though requiring follow-up colonoscopy to exclude underlying neoplasm. Numerous sigmoid diverticula. No extraluminal gas or discrete abscess collection. At the site of gas beyond the colon on the previous exam which appear to represent a sinus tract, a small collection of extraluminal contrast is seen but no air is identified. Vascular/Lymphatic: Scattered pelvic phleboliths. Atherosclerotic calcifications aorta  and iliac arteries without aneurysm. No adenopathy, few normal sized mesenteric nodes noted. Reproductive: Uterus surgically absent. Nonvisualization of ovaries. Other: Free fluid perihepatic, minimally perisplenic and both pericolic gutters. No free air. No hernia. Musculoskeletal: Osseous demineralization. Mild degenerative changes and scoliosis of thoracolumbar spine. IMPRESSION: Sigmoid diverticulosis with marked wall thickening and pericolic infiltrative changes at the proximal sigmoid colon likely representing acute diverticulitis. Follow-up colonoscopy to exclude underlying colonic neoplasm. Again identified short sinus tract extending from the site of diverticulitis into the mesocolon, containing contrast but no air. No evidence of abscess or free intraperitoneal perforation. Small amount of ascites. Hepatic and LEFT renal cysts. Aortic Atherosclerosis (ICD10-I70.0). Electronically Signed   By: Lavonia Dana M.D.   On: 04/18/2020 15:30        Scheduled Meds: . docusate sodium  100 mg Oral BID  . feeding supplement  237 mL Oral BID BM  . metoprolol succinate  25 mg Oral Daily  . polyethylene glycol  17 g Oral BID  . rosuvastatin  20 mg Oral Daily   Continuous Infusions: . piperacillin-tazobactam 2.25 g (04/20/20 0359)     LOS: 6 days    Time spent: 25 mins,More than 50% of that time was spent in counseling and/or coordination of care.      Shelly Coss, MD [facility] P12/27/2021, 10:45 AM

## 2020-04-20 NOTE — Progress Notes (Signed)
Nutrition Follow-up  INTERVENTION:   -Ensure Enlive po BID, each supplement provides 350 kcal and 20 grams of protein -D/c Boost Breeze -Fiber handouts in d/c instructions  NUTRITION DIAGNOSIS:   Increased nutrient needs related to acute illness (diverticulitis) as evidenced by estimated needs.  Improving  GOAL:   Patient will meet greater than or equal to 90% of their needs  Not meeting.  MONITOR:   PO intake,Supplement acceptance,Labs,Weight trends,I & O's,Diet advancement  REASON FOR ASSESSMENT:   Consult Assessment of nutrition requirement/status  ASSESSMENT:   85 year old female with past medical history of CVA on aspirin, CKD 4, hypertension, hyperlipidemia, diverticulitis, s/p appendectomy and hysterectomy who presented to the ED with 2 weeks of intermittent LLQ pain and blood in stool since this morning.  Patient now on soft diet. Eager to go home. Pt not eating much of meals, mainly drinking fluids. Will switch supplement to Ensure supplements for more kcals and protein.   Admission weight: 132 lbs. No new weights for admission.  Labs reviewed. Medications: Colace, MAG-OX, Miralax  Diet Order:   Diet Order            DIET SOFT Room service appropriate? Yes; Fluid consistency: Thin  Diet effective now                 EDUCATION NEEDS:   Education needs have been addressed  Skin:  Skin Assessment: Reviewed RN Assessment  Last BM:  12/26  Height:   Ht Readings from Last 1 Encounters:  03/28/2020 5\' 2"  (1.575 m)    Weight:   Wt Readings from Last 1 Encounters:  03/27/2020 59.9 kg    BMI:  Body mass index is 24.14 kg/m.  Estimated Nutritional Needs:   Kcal:  1500-1700  Protein:  70-85g  Fluid:  1.7L/day  Martha Bibles, MS, RD, LDN Inpatient Clinical Dietitian Contact information available via Amion

## 2020-04-20 NOTE — Progress Notes (Signed)
Patient ID: Martha Ortega, female   DOB: Aug 29, 1934, 85 y.o.   MRN: [id number]   Acute Care Surgery Service Progress Note:    Chief Complaint/Subjective: abd pain improving. tolerating PO without worsening abd pain, nausea, or vomiting. Did not have an additional BM after the one she had yesterday, having minimal flatus. Hoping to go home.     Objective: Vital signs in last 24 hours: Temp:  [98.5 F (36.9 C)-99 F (37.2 C)] 99 F (37.2 C) (12/27 0520) Pulse Rate:  [96-104] 96 (12/27 0520) Resp:  [16-20] 16 (12/27 0520) BP: (133-149)/(56-81) 142/56 (12/27 0520) SpO2:  [98 %-100 %] 100 % (12/27 0520) Last BM Date: 04/19/20  Intake/Output from previous day: 12/26 0701 - 12/27 0700 In: 560.1 [P.O.:360; IV Piggyback:200.1] Out: -  Intake/Output this shift: No intake/output data recorded.  Lungs: cta, nonlabored  Cardiovascular: reg  Abd: soft, nd, mild mostly subjective TTP LLQ pain  Extremities: no edema, +SCDs  Neuro: alert, nonfocal  Lab Results: CBC  Recent Labs    04/19/20 0505 04/20/20 0417  WBC 12.9* 12.3*  HGB 7.4* 7.4*  HCT 22.9* 23.1*  PLT 434* 472*   BMET Recent Labs    04/19/20 0505 04/20/20 0417  NA 140 139  K 4.2 3.9  CL 110 109  CO2 20* 19*  GLUCOSE 109* 104*  BUN 13 15  CREATININE 2.04* 1.96*  CALCIUM 8.4* 8.2*   LFT Hepatic Function Latest Ref Rng & Units 04/02/2020 04/06/2020 04/06/2020  Total Protein 6.5 - 8.1 g/dL 6.7 - 7.7  Albumin 3.5 - 5.0 g/dL 2.7(L) 3.7 3.7  AST 15 - 41 U/L 26 - 19  ALT 0 - 44 U/L 10 - 7  Alk Phosphatase 38 - 126 U/L 61 - 55  Total Bilirubin 0.3 - 1.2 mg/dL 1.2 - 0.7  Bilirubin, Direct 0.0 - 0.3 mg/dL - - 0.2   PT/INR No results for input(s): LABPROT, INR in the last 72 hours. ABG No results for input(s): PHART, HCO3 in the last 72 hours.  Invalid input(s): PCO2, PO2  Studies/Results:  Anti-infectives: Anti-infectives (From admission, onward)   Start     Dose/Rate Route Frequency Ordered Stop    03/27/2020 1200  piperacillin-tazobactam (ZOSYN) IVPB 2.25 g        2.25 g 100 mL/hr over 30 Minutes Intravenous Every 8 hours 04/07/2020 1138        Medications: Scheduled Meds: . docusate sodium  100 mg Oral BID  . feeding supplement  237 mL Oral BID BM  . magnesium oxide  400 mg Oral Daily  . metoprolol succinate  25 mg Oral Daily  . polyethylene glycol  17 g Oral Daily  . rosuvastatin  20 mg Oral Daily   Continuous Infusions: . piperacillin-tazobactam 2.25 g (04/20/20 0359)   PRN Meds:.acetaminophen **OR** acetaminophen, HYDROcodone-acetaminophen, morphine injection, ondansetron **OR** ondansetron (ZOFRAN) IV  Assessment/Plan: Patient Active Problem List   Diagnosis Date Noted  . Diverticulitis 04/21/2020  . Aortic atherosclerosis ([place]) 04/02/2020  . ABLA (acute blood loss anemia) 04/05/2020  . Blood in stool   . Abdominal pain 04/06/2020  . CKD (chronic kidney disease), stage IV ([place]) 04/06/2020  . Mild protein-calorie malnutrition ([place]) 04/06/2020  . Osteopenia 07/26/2015  . Estrogen deficiency 06/23/2015  . History of CVA (cerebrovascular accident) 03/02/2015  . Encounter for Medicare annual wellness exam 09/23/2013  . Spinal stenosis of lumbar region 09/23/2013  . GERD (gastroesophageal reflux disease) 12/12/2011  . Renal insufficiency 11/26/2008  . ALLERGIC RHINITIS 09/10/2007  .  Hyperlipidemia 09/08/2006  . Essential hypertension 09/08/2006  . POSTNASAL DRIP SYNDROME 09/08/2006  . DIVERTICULITIS, HX OF 09/08/2006   Martha Ortega is an 85 y.o. female with  Sigmoid diverticulitis with small fluid collection 1 episode of hematochezia Acute blood loss anemia Chronic kidney disease Hyperlipidemia Aortic atherosclerosis Hypokalemia Probable moderate protein calorie malnutrition - prealbumin 7.2 (12/26)  Repeat CT 12/25 was essentially stable - tiny fluid collection; not increased in size.  continue soft diet and protein shakes given malnutrition. Patient is  clinically improving with less pain and largely benign abd exam but remains mildly distended and constipated with WBC 12. Increase miralax to BID. Patient may benefit from another day of IV abx and bowel regimen rather than D/C home on PO abx today. Will discuss with MD. See below significant history of likely sigmoid stricture.   See consult note regarding her colonoscopy history (attempted colonoscopy in 2007 aborted due to narrowing in sigmoid, BE shortly thereafter also showed narrowing, from what I can tell had no addl f/u regarding that area of narrowing but pt does not give history of thin stools, bloating, etc)    Ideally needs evaluation of colon (colonscopy, virtual colonoscopy) - rec outpt GI follow up in about 6 weeks.    LOS: 6 days    [facility] [phone] [facility]

## 2020-04-20 NOTE — TOC Progression Note (Signed)
Transition of Care [facility]) - Progression Note    Patient Details  Name: Martha Ortega MRN: [id number] Date of Birth: [birth year]  [facility]) CM/SW Contact  Joaquin Courts, RN Phone Number: 04/20/2020, 1:51 PM  Clinical Narrative:    CM spoke with patient re HHPT recommendation.  Patient set up with [facility] for [place].   Expected Discharge Plan: [place] Barriers to Discharge: Continued Medical Work up  Expected Discharge Plan and Services Expected Discharge Plan: [place] Choice: [place] arrangements for the past 2 months: [place]: PT [place]: [place] Date [place]: 04/20/20 Time [place]: 1350 Representative spoke with at [place]: [place] ([place]) Interventions    Readmission Risk Interventions No flowsheet data found.

## 2020-04-20 NOTE — Progress Notes (Signed)
Physical Therapy Treatment Patient Details Name: Martha Ortega MRN: [id number] DOB: 1934-08-23 Today's Date: 04/20/2020    History of Present Illness Pt admitted with abdominal pain, blood in stool and sepsis 2* IB related to diverticulitis.  Pt with hx of CVA ~ 5 yrs ago with residual L UE and chest "tightness" and L foot "numbness    PT Comments    Pt is AxO x 3 and very pleasant.  Assisted OOB to amb to bathroom with out need for any AD.  Pt also self able to perform peri care safely.  General transfer comment: good safety cognition and use of hands to steady selfAssisted with amb in hallway > 200 feet.  General Gait Details: amb without a walker this tome as she does not typically use one at home.  Tolerated distance well and no overt LOB.  Intermittent L LQ ABD "tightness" which pt describes "it comes and goes". Pt plans to return home where her son's lives with her.    Follow Up Recommendations  Home health PT;No PT follow up     Equipment Recommendations  None recommended by PT    Recommendations for Other Services       Precautions / Restrictions Precautions Precautions: Fall Restrictions Weight Bearing Restrictions: No    Mobility  Bed Mobility Overal bed mobility: Modified Independent       Supine to sit: Modified independent (Device/Increase time)     General bed mobility comments: only required increased time  Transfers Overall transfer level: Needs assistance Equipment used: None Transfers: Sit to/from [facility] Sit to Stand: Supervision Stand pivot transfers: Supervision       General transfer comment: good safety cognition and use of hands to steady self  Ambulation/Gait Ambulation/Gait assistance: Supervision;Min guard Gait Distance (Feet): 225 Feet Assistive device: None Gait Pattern/deviations: Step-through pattern Gait velocity: decr   General Gait Details: amb without a walker this tome as she does not typically use  one at home.  Tolerated distance well and no overt LOB.  Intermittent L LQ ABD "tightness" which pt describes "it comes and goes".   Stairs             Wheelchair Mobility    Modified Rankin (Stroke Patients Only)       Balance                                            Cognition Arousal/Alertness: Awake/alert Behavior During Therapy: WFL for tasks assessed/performed Overall Cognitive Status: Within Functional Limits for tasks assessed                                 General Comments: AxO x 3 very pleasant      Exercises      General Comments        Pertinent Vitals/Pain Pain Assessment: Faces Faces Pain Scale: Hurts a little bit Pain Location: abdomen Pain Descriptors / Indicators: Aching;Sore Pain Intervention(s): Monitored during session    Home Living                      Prior Function            PT Goals (current goals can now be found in the care plan section) Progress towards PT goals: Progressing toward goals  Frequency    Min 3X/week      PT Plan Current plan remains appropriate    Co-evaluation              AM-PAC PT "6 Clicks" Mobility   Outcome Measure  Help needed turning from your back to your side while in a flat bed without using bedrails?: None Help needed moving from lying on your back to sitting on the side of a flat bed without using bedrails?: None Help needed moving to and from a bed to a chair (including a wheelchair)?: None Help needed standing up from a chair using your arms (e.g., wheelchair or bedside chair)?: None Help needed to walk in hospital room?: A Little Help needed climbing 3-5 steps with a railing? : A Little 6 Click Score: 22    End of Session Equipment Utilized During Treatment: Gait belt Activity Tolerance: Patient tolerated treatment well Patient left: with call bell/phone within reach;in bed Nurse Communication: Mobility status PT Visit  Diagnosis: Unsteadiness on feet (R26.81);Muscle weakness (generalized) (M62.81);Difficulty in walking, not elsewhere classified (R26.2);Pain     Time: 0375-4360 PT Time Calculation (min) (ACUTE ONLY): 17 min  Charges:  $Gait Training: 8-22 mins                     Rica Koyanagi  PTA Acute  Rehabilitation Services Pager      [phone] Office      [phone]

## 2020-04-21 ENCOUNTER — Inpatient Hospital Stay (HOSPITAL_COMMUNITY): Payer: Medicare Other

## 2020-04-21 DIAGNOSIS — K5720 Diverticulitis of large intestine with perforation and abscess without bleeding: Secondary | ICD-10-CM

## 2020-04-21 DIAGNOSIS — K5792 Diverticulitis of intestine, part unspecified, without perforation or abscess without bleeding: Secondary | ICD-10-CM | POA: Diagnosis not present

## 2020-04-21 DIAGNOSIS — R079 Chest pain, unspecified: Secondary | ICD-10-CM

## 2020-04-21 DIAGNOSIS — I361 Nonrheumatic tricuspid (valve) insufficiency: Secondary | ICD-10-CM

## 2020-04-21 LAB — CBC WITH DIFFERENTIAL/PLATELET
Abs Immature Granulocytes: 0.09 10*3/uL — ABNORMAL HIGH (ref 0.00–0.07)
Basophils Absolute: 0 10*3/uL (ref 0.0–0.1)
Basophils Relative: 0 %
Eosinophils Absolute: 0 10*3/uL (ref 0.0–0.5)
Eosinophils Relative: 0 %
HCT: 22 % — ABNORMAL LOW (ref 36.0–46.0)
Hemoglobin: 7.2 g/dL — ABNORMAL LOW (ref 12.0–15.0)
Immature Granulocytes: 1 %
Lymphocytes Relative: 12 %
Lymphs Abs: 1.4 10*3/uL (ref 0.7–4.0)
MCH: 27.4 pg (ref 26.0–34.0)
MCHC: 32.7 g/dL (ref 30.0–36.0)
MCV: 83.7 fL (ref 80.0–100.0)
Monocytes Absolute: 0.6 10*3/uL (ref 0.1–1.0)
Monocytes Relative: 5 %
Neutro Abs: 9.4 10*3/uL — ABNORMAL HIGH (ref 1.7–7.7)
Neutrophils Relative %: 82 %
Platelets: 430 10*3/uL — ABNORMAL HIGH (ref 150–400)
RBC: 2.63 MIL/uL — ABNORMAL LOW (ref 3.87–5.11)
RDW: 15.6 % — ABNORMAL HIGH (ref 11.5–15.5)
WBC: 11.6 10*3/uL — ABNORMAL HIGH (ref 4.0–10.5)
nRBC: 0 % (ref 0.0–0.2)

## 2020-04-21 LAB — ECHOCARDIOGRAM COMPLETE
Area-P 1/2: 4.75 cm2
Height: 62 in
S' Lateral: 2 cm
Weight: 2112.01 [oz_av]

## 2020-04-21 LAB — BASIC METABOLIC PANEL
Anion gap: 12 (ref 5–15)
BUN: 19 mg/dL (ref 8–23)
CO2: 19 mmol/L — ABNORMAL LOW (ref 22–32)
Calcium: 8.3 mg/dL — ABNORMAL LOW (ref 8.9–10.3)
Chloride: 108 mmol/L (ref 98–111)
Creatinine, Ser: 2.03 mg/dL — ABNORMAL HIGH (ref 0.44–1.00)
GFR, Estimated: 24 mL/min — ABNORMAL LOW (ref >60)
Glucose, Bld: 102 mg/dL — ABNORMAL HIGH (ref 70–99)
Potassium: 4.4 mmol/L (ref 3.5–5.1)
Sodium: 139 mmol/L (ref 135–145)

## 2020-04-21 MED ORDER — ALUM & MAG HYDROXIDE-SIMETH 200-200-20 MG/5ML PO SUSP: 30.0000 mL | Freq: Four times a day (QID) | ORAL | Status: DC | PRN

## 2020-04-21 MED ORDER — BISACODYL 10 MG RE SUPP
10.0000 mg | Freq: Every day | RECTAL | Status: DC
  Administered 2020-04-21 – 2020-04-29 (×9): 10 mg via RECTAL
  Filled 2020-04-21 (×9): qty 1

## 2020-04-21 MED ORDER — METHOCARBAMOL 1000 MG/10ML IJ SOLN
1000.0000 mg | Freq: Four times a day (QID) | INTRAVENOUS | Status: DC | PRN
  Administered 2020-04-22 – 2020-04-29 (×3): 1000 mg via INTRAVENOUS
  Filled 2020-04-21: qty 10
  Filled 2020-04-21 (×3): qty 1000

## 2020-04-21 MED ORDER — SIMETHICONE 40 MG/0.6ML PO SUSP
40.0000 mg | Freq: Four times a day (QID) | ORAL | Status: DC | PRN
  Administered 2020-04-23: 40 mg via ORAL
  Filled 2020-04-21 (×3): qty 0.6

## 2020-04-21 MED ORDER — LACTATED RINGERS IV BOLUS
1000.0000 mL | Freq: Three times a day (TID) | INTRAVENOUS | Status: AC | PRN
  Administered 2020-04-22: 21:00:00 1000 mL via INTRAVENOUS

## 2020-04-21 MED ORDER — LIP MEDEX EX OINT
1.0000 "application " | TOPICAL_OINTMENT | Freq: Two times a day (BID) | CUTANEOUS | Status: DC
  Administered 2020-04-21 – 2020-04-30 (×18): 1 via TOPICAL
  Filled 2020-04-21 (×5): qty 7

## 2020-04-21 MED ORDER — DIPHENHYDRAMINE HCL 50 MG/ML IJ SOLN
12.5000 mg | Freq: Four times a day (QID) | INTRAMUSCULAR | Status: DC | PRN
  Administered 2020-04-28 (×3): 12.5 mg via INTRAVENOUS
  Administered 2020-04-29 – 2020-04-30 (×2): 25 mg via INTRAVENOUS
  Filled 2020-04-21 (×5): qty 1

## 2020-04-21 MED ORDER — BISACODYL 10 MG RE SUPP
10.0000 mg | Freq: Once | RECTAL | Status: AC
  Administered 2020-04-21: 10:00:00 10 mg via RECTAL
  Filled 2020-04-21: qty 1

## 2020-04-21 MED ORDER — MAGIC MOUTHWASH
15.0000 mL | Freq: Four times a day (QID) | ORAL | Status: DC | PRN
  Administered 2020-04-22: 15 mL via ORAL
  Filled 2020-04-21 (×2): qty 15

## 2020-04-21 NOTE — Progress Notes (Signed)
PROGRESS NOTE    Martha Ortega  POE:[id number] DOB: [birth year] DOA: 03/26/2020 PCP: [name], MD   Chief Complain: Abdominal pain  Brief Narrative: Patient is 85 year old female with history of nonhemorrhagic CVA, CKD stage IV, hypertension, hyperlipidemia, diverticulitis, status post appendicectomy and hysterectomy who presented to the emergency room with 2 weeks history of intermittent left lower quadrant pain, blood in the stool.  She was started on Augmentin as an outpatient for presumed diverticulitis by her PCP but it did not help.  On presentation, she was tachycardic and tachypneic, had leukocytosis, lactic acidosis.  Imaging showed recurrent descending colon diverticulitis complicated by sinus tract and collection in the lower paracolic catheter.  She was started on IV antibiotics, GI and general surgery consulted.  She had a prolonged hospital course was persistent abdominal pain, no bowel movement.  Assessment & Plan:   Principal Problem:   Diverticulitis Active Problems:   Hyperlipidemia   CKD (chronic kidney disease), stage IV (HCC)   Aortic atherosclerosis (HCC)   Blood in stool   ABLA (acute blood loss anemia)   Sepsis: Presented with tachycardia, tachypnea, tachycardia, leukocytosis.  Sepsis physiology improved.  Continue current antibiotics.  Cultures negative so far.  Recurrent diverticulitis: Complicated by sinus tract and fluid collection in the lower paracolic gutter.  General surgery following.  Repeat CT abdomen/pelvis on December 25 showed  sigmoid diverticulosis with marked wall thickening and pericolic inflammatory changes at the proximal sigmoid colon, no evidence of abscess or free intraperitoneal perforation.  Leukocytosis has resolved.  General surgery recommended to continue current abx. GI was also consulted during this admission who did not recommend colonoscopy due to acute diverticulitis.  She needs to follow-up with GI as an outpatient for  colonoscopy in 6 weeks. General surgery not ruling out possibility of surgical management. We will go ahead and order an echocardiogram.  She doesnot  not have any significant acute cardiopulmonary problems.  Depending upon echocardiogram, will stratify the risks for possible surgery.  We will involve cardiology if needed. Patient continues to complain of left lower quadrant pain and does not have a bowel movement despite being on bowel regimen.  General surgery ordered suppository today.  Hypokalemia/hypomagnesemia: Supplemented and corrected  Normocytic anemia/suspected GI bleed: Hemoglobin in the range of 7, stable.  Low iron as per iron studies,given a dose iv infusion.  She was reporting hematochezia at home which was most likely associated with diverticulitis.  GI was consulted during this admission, no plan for intervention.  She will follow-up with GI as an outpatient  Hypertension: Continue current medications.  Monitor blood pressure.  On Cozaar, hydralazine, metoprolol at home.  Cozaar, hydralazine on hold  History of nonhemorrhagic CVA: On aspirin, statin at home. Aspirin on hold  in the setting of GI bleed/acute anemia/possible GI bleed.  CKD stage IV: Currently kidney function at baseline.  Cozaar can be discontinued on discharge.    Nutrition Problem: Increased nutrient needs Etiology: acute illness (diverticulitis)      DVT prophylaxis:SCD Code Status: Full Family Communication: Called and discussed with son on phone today Status is: Inpatient  Remains inpatient appropriate because:Inpatient level of care appropriate due to severity of illness   Dispo: The patient is from: Home              Anticipated d/c is to: Home              Anticipated d/c date is: 2-3 days  Patient currently is not medically stable to d/c.      Consultants: Surgery  Procedures:None  Antimicrobials:  Anti-infectives (From admission, onward)   Start     Dose/Rate Route  Frequency Ordered Stop   04/15/2020 1200  piperacillin-tazobactam (ZOSYN) IVPB 2.25 g        2.25 g 100 mL/hr over 30 Minutes Intravenous Every 8 hours 04/09/2020 1138        Subjective: Patient seen and examined at the bedside this morning.  Hemodynamically stable.  Still complains of left lower quadrant discomfort.  Not having bowel movement since last several days.  Her abdomen is not distended though.  Objective: Vitals:   04/20/20 1013 04/20/20 1311 04/20/20 2047 04/21/20 0627  BP: 136/69 (!) 146/70 (!) 150/72 (!) 143/78  Pulse: 99 100 (!) 103 (!) 110  Resp: 16  18 18   Temp:  98.4 F (36.9 C) 97.8 F (36.6 C) 98.6 F (37 C)  TempSrc:  Oral Oral Oral  SpO2:  99% 96% 96%  Weight:      Height:        Intake/Output Summary (Last 24 hours) at 04/21/2020 0731 Last data filed at 04/21/2020 0600 Gross per 24 hour  Intake 995.8 ml  Output --  Net 995.8 ml   Filed Weights   04/07/2020 1333  Weight: 59.9 kg    Examination:  General exam: Pleasant elderly female Respiratory system: Bilateral equal air entry, normal vesicular breath sounds, no wheezes or crackles  Cardiovascular system: S1 & S2 heard, RRR. No JVD, murmurs, rubs, gallops or clicks. Gastrointestinal system: Abdomen is mildly distended, soft and tender in the left lower quadrant.  Slow bowel sounds heard. Central nervous system: Alert and oriented. No focal neurological deficits. Extremities: No edema, no clubbing ,no cyanosis, Skin: No rashes, lesions or ulcers,no icterus ,no pallor   Data Reviewed: I have personally reviewed following labs and imaging studies  CBC: Recent Labs  Lab 04/12/2020 0910 04/06/2020 1916 04/17/20 0526 04/18/20 0509 04/19/20 0505 04/20/20 0417 04/21/20 0549  WBC 23.4*   < > 11.4* 10.2 12.9* 12.3* 11.6*  NEUTROABS 20.7*  --   --   --   --  8.9* 9.4*  HGB 10.0*   < > 7.2* 7.6* 7.4* 7.4* 7.2*  HCT 30.5*   < > 22.6* 23.6* 22.9* 23.1* 22.0*  MCV 83.8   < > 85.9 86.1 83.9 84.9 83.7   PLT 459*   < > 361 433* 434* 472* 430*   < > = values in this interval not displayed.   Basic Metabolic Panel: Recent Labs  Lab 04/17/20 0526 04/18/20 0509 04/19/20 0505 04/20/20 0417 04/21/20 0549  NA 140 141 140 139 139  K 4.0 3.2* 4.2 3.9 4.4  CL 109 109 110 109 108  CO2 21* 20* 20* 19* 19*  GLUCOSE 132* 116* 109* 104* 102*  BUN 15 13 13 15 19   CREATININE 1.76* 1.86* 2.04* 1.96* 2.03*  CALCIUM 8.2* 8.3* 8.4* 8.2* 8.3*  MG  --   --  1.7 1.8  --    GFR: Estimated Creatinine Clearance: 16.6 mL/min (A) (by C-G formula based on SCr of 2.03 mg/dL (H)). Liver Function Tests: Recent Labs  Lab 04/21/2020 0910  AST 26  ALT 10  ALKPHOS 61  BILITOT 1.2  PROT 6.7  ALBUMIN 2.7*   No results for input(s): LIPASE, AMYLASE in the last 168 hours. No results for input(s): AMMONIA in the last 168 hours. Coagulation Profile: Recent Labs  Lab 04/16/20  0531  INR 1.5*   Cardiac Enzymes: No results for input(s): CKTOTAL, CKMB, CKMBINDEX, TROPONINI in the last 168 hours. BNP (last 3 results) No results for input(s): PROBNP in the last 8760 hours. HbA1C: No results for input(s): HGBA1C in the last 72 hours. CBG: No results for input(s): GLUCAP in the last 168 hours. Lipid Profile: No results for input(s): CHOL, HDL, LDLCALC, TRIG, CHOLHDL, LDLDIRECT in the last 72 hours. Thyroid Function Tests: No results for input(s): TSH, T4TOTAL, FREET4, T3FREE, THYROIDAB in the last 72 hours. Anemia Panel: Recent Labs    04/20/20 1118  FERRITIN 204  TIBC 152*  IRON 24*   Sepsis Labs: Recent Labs  Lab 04/07/2020 0910 04/09/2020 1253 04/12/2020 1541 04/18/20 0509  LATICACIDVEN 3.6* 5.1* 1.4 1.2    Recent Results (from the past 240 hour(s))  Resp Panel by RT-PCR (Flu A&B, Covid) Nasopharyngeal Swab     Status: None   Collection Time: 03/29/2020  9:13 AM   Specimen: Nasopharyngeal Swab; Nasopharyngeal(NP) swabs in vial transport medium  Result Value Ref Range Status   SARS Coronavirus 2  by RT PCR NEGATIVE NEGATIVE Final    Comment: (NOTE) SARS-CoV-2 target nucleic acids are NOT DETECTED.  The SARS-CoV-2 RNA is generally detectable in upper respiratory specimens during the acute phase of infection. The lowest concentration of SARS-CoV-2 viral copies this assay can detect is 138 copies/mL. A negative result does not preclude SARS-Cov-2 infection and should not be used as the sole basis for treatment or other patient management decisions. A negative result may occur with  improper specimen collection/handling, submission of specimen other than nasopharyngeal swab, presence of viral mutation(s) within the areas targeted by this assay, and inadequate number of viral copies(<138 copies/mL). A negative result must be combined with clinical observations, patient history, and epidemiological information. The expected result is Negative.  Fact Sheet for Patients:  EntrepreneurPulse.com.au  Fact Sheet for Healthcare Providers:  IncredibleEmployment.be  This test is no t yet approved or cleared by the [place] FDA and  has been authorized for detection and/or diagnosis of SARS-CoV-2 by FDA under an Emergency Use Authorization (EUA). This EUA will remain  in effect (meaning this test can be used) for the duration of the COVID-19 declaration under Section 564(b)(1) of the Act, 21 U.S.C.section 360bbb-3(b)(1), unless the authorization is terminated  or revoked sooner.       Influenza A by PCR NEGATIVE NEGATIVE Final   Influenza B by PCR NEGATIVE NEGATIVE Final    Comment: (NOTE) The Xpert Xpress SARS-CoV-2/FLU/RSV plus assay is intended as an aid in the diagnosis of influenza from Nasopharyngeal swab specimens and should not be used as a sole basis for treatment. Nasal washings and aspirates are unacceptable for Xpert Xpress SARS-CoV-2/FLU/RSV testing.  Fact Sheet for Patients: EntrepreneurPulse.com.au  Fact  Sheet for Healthcare Providers: IncredibleEmployment.be  This test is not yet approved or cleared by the [place] FDA and has been authorized for detection and/or diagnosis of SARS-CoV-2 by FDA under an Emergency Use Authorization (EUA). This EUA will remain in effect (meaning this test can be used) for the duration of the COVID-19 declaration under Section 564(b)(1) of the Act, 21 U.S.C. section 360bbb-3(b)(1), unless the authorization is terminated or revoked.  Performed at [facility], [place] [address], [place], [place] [address]          Radiology Studies: No results found.      Scheduled Meds: . docusate sodium  100 mg Oral BID  . feeding supplement  237 mL Oral BID BM  . metoprolol succinate  25 mg Oral Daily  . polyethylene glycol  17 g Oral BID  . rosuvastatin  20 mg Oral Daily   Continuous Infusions: . piperacillin-tazobactam 2.25 g (04/21/20 0340)     LOS: 7 days    Time spent: 25 mins,More than 50% of that time was spent in counseling and/or coordination of care.      Shelly Coss, MD [facility] P12/28/2021, 7:31 AM

## 2020-04-21 NOTE — Progress Notes (Signed)
  Echocardiogram 2D Echocardiogram has been performed.  Jennette Dubin 04/21/2020, 2:25 PM

## 2020-04-21 NOTE — Progress Notes (Signed)
Patient ID: Martha Ortega, female   DOB: 1934-07-03, 85 y.o.   MRN: [id number]   Acute Care Surgery Service Progress Note:    Chief Complaint/Subjective: abd pain improving. tolerating PO but states she isn't really eating, just wants ice water. States she has only had one BM since admission. Feels like it was one week ago when she last pooped. Having minimal flatus. Mobilizing independently to the bathroom. Denies fever/chills.   Objective: Vital signs in last 24 hours: Temp:  [97.8 F (36.6 C)-98.6 F (37 C)] 98.6 F (37 C) (12/28 0627) Pulse Rate:  [99-110] 110 (12/28 0627) Resp:  [16-18] 18 (12/28 0627) BP: (136-150)/(69-78) 143/78 (12/28 0627) SpO2:  [96 %-99 %] 96 % (12/28 0627) Last BM Date: 04/17/20  Intake/Output from previous day: 12/27 0701 - 12/28 0700 In: 995.8 [P.O.:840; IV Piggyback:155.8] Out: -  Intake/Output this shift: No intake/output data recorded.  Lungs: cta, nonlabored  Cardiovascular: reg  Abd: soft, distended/firm, mild mostly subjective TTP LLQ pain  Extremities: no edema, +SCDs  Neuro: alert, nonfocal  Lab Results: CBC  Recent Labs    04/20/20 0417 04/21/20 0549  WBC 12.3* 11.6*  HGB 7.4* 7.2*  HCT 23.1* 22.0*  PLT 472* 430*   BMET Recent Labs    04/20/20 0417 04/21/20 0549  NA 139 139  K 3.9 4.4  CL 109 108  CO2 19* 19*  GLUCOSE 104* 102*  BUN 15 19  CREATININE 1.96* 2.03*  CALCIUM 8.2* 8.3*   LFT Hepatic Function Latest Ref Rng & Units 03/28/2020 04/06/2020 04/06/2020  Total Protein 6.5 - 8.1 g/dL 6.7 - 7.7  Albumin 3.5 - 5.0 g/dL 2.7(L) 3.7 3.7  AST 15 - 41 U/L 26 - 19  ALT 0 - 44 U/L 10 - 7  Alk Phosphatase 38 - 126 U/L 61 - 55  Total Bilirubin 0.3 - 1.2 mg/dL 1.2 - 0.7  Bilirubin, Direct 0.0 - 0.3 mg/dL - - 0.2   PT/INR No results for input(s): LABPROT, INR in the last 72 hours. ABG No results for input(s): PHART, HCO3 in the last 72 hours.  Invalid input(s): PCO2,  PO2  Studies/Results:  Anti-infectives: Anti-infectives (From admission, onward)   Start     Dose/Rate Route Frequency Ordered Stop   04/06/2020 1200  piperacillin-tazobactam (ZOSYN) IVPB 2.25 g        2.25 g 100 mL/hr over 30 Minutes Intravenous Every 8 hours 04/12/2020 1138        Medications: Scheduled Meds: . bisacodyl  10 mg Rectal Once  . docusate sodium  100 mg Oral BID  . feeding supplement  237 mL Oral BID BM  . metoprolol succinate  25 mg Oral Daily  . polyethylene glycol  17 g Oral BID  . rosuvastatin  20 mg Oral Daily   Continuous Infusions: . piperacillin-tazobactam 2.25 g (04/21/20 0340)   PRN Meds:.acetaminophen **OR** acetaminophen, HYDROcodone-acetaminophen, morphine injection, ondansetron **OR** ondansetron (ZOFRAN) IV  Assessment/Plan: Patient Active Problem List   Diagnosis Date Noted  . Diverticulitis 04/13/2020  . Aortic atherosclerosis ([place]) 03/30/2020  . ABLA (acute blood loss anemia) 04/07/2020  . Blood in stool   . Abdominal pain 04/06/2020  . CKD (chronic kidney disease), stage IV ([place]) 04/06/2020  . Mild protein-calorie malnutrition ([place]) 04/06/2020  . Osteopenia 07/26/2015  . Estrogen deficiency 06/23/2015  . History of CVA (cerebrovascular accident) 03/02/2015  . Encounter for Medicare annual wellness exam 09/23/2013  . Spinal stenosis of lumbar region 09/23/2013  . GERD (gastroesophageal reflux disease) 12/12/2011  .  Renal insufficiency 11/26/2008  . ALLERGIC RHINITIS 09/10/2007  . Hyperlipidemia 09/08/2006  . Essential hypertension 09/08/2006  . POSTNASAL DRIP SYNDROME 09/08/2006  . DIVERTICULITIS, HX OF 09/08/2006   Martha Ortega is an 85 y.o. female with  Sigmoid diverticulitis with small fluid collection 1 episode of hematochezia Acute blood loss anemia Chronic kidney disease Hyperlipidemia Aortic atherosclerosis Hypokalemia Probable moderate protein calorie malnutrition - prealbumin 7.2 (12/26)  Repeat CT 12/25 was  essentially stable - tiny fluid collection; not increased in size.  continue soft diet and protein shakes given malnutrition. Patient is clinically improving with less pain and largely benign abd exam but remains mildly distended and constipated. WBC 11.6, improving slowly. patient should be having more reliable bowel function prior to discharge. Continue BID miralax, give dulcolax suppository. If she has a BM today then she could potentially be discharged home on PO abx later today to complete a total of 14 days of abx. Encourage PO intake. See below significant history of likely sigmoid stricture.   See consult note regarding her colonoscopy history (attempted colonoscopy in 2007 aborted due to narrowing in sigmoid, BE shortly thereafter also showed narrowing, from what I can tell had no addl f/u regarding that area of narrowing but pt does not give history of thin stools, bloating, etc)    Ideally needs evaluation of colon (colonscopy, virtual colonoscopy) - rec outpt GI follow up in about 6 weeks.    LOS: 7 days    [facility] [phone] [facility]

## 2020-04-21 NOTE — Telephone Encounter (Signed)
Spoke with Martha Ortega and she said that it was a miscommunication. They are aware that the hospital will put initial orders in, they just wanted to make sure PCP would be the one they contact for any additional orders or home health needs after the initial eval. I did advise [name] that Dr. Glori Bickers is her PCP and that it is okay to contact us with any home health issues or questions after pt's D/C

## 2020-04-22 ENCOUNTER — Encounter (HOSPITAL_COMMUNITY): Payer: Self-pay | Admitting: Internal Medicine

## 2020-04-22 ENCOUNTER — Encounter (HOSPITAL_COMMUNITY): Admission: EM | Disposition: E | Payer: Self-pay | Source: Home / Self Care | Attending: Internal Medicine

## 2020-04-22 ENCOUNTER — Inpatient Hospital Stay (HOSPITAL_COMMUNITY): Payer: Medicare Other | Admitting: Certified Registered Nurse Anesthetist

## 2020-04-22 ENCOUNTER — Inpatient Hospital Stay (HOSPITAL_COMMUNITY): Payer: Medicare Other

## 2020-04-22 DIAGNOSIS — A419 Sepsis, unspecified organism: Principal | ICD-10-CM

## 2020-04-22 DIAGNOSIS — I639 Cerebral infarction, unspecified: Secondary | ICD-10-CM

## 2020-04-22 DIAGNOSIS — K5720 Diverticulitis of large intestine with perforation and abscess without bleeding: Secondary | ICD-10-CM

## 2020-04-22 DIAGNOSIS — K56609 Unspecified intestinal obstruction, unspecified as to partial versus complete obstruction: Secondary | ICD-10-CM | POA: Diagnosis not present

## 2020-04-22 DIAGNOSIS — N184 Chronic kidney disease, stage 4 (severe): Secondary | ICD-10-CM | POA: Diagnosis not present

## 2020-04-22 DIAGNOSIS — D62 Acute posthemorrhagic anemia: Secondary | ICD-10-CM | POA: Diagnosis not present

## 2020-04-22 DIAGNOSIS — K5721 Diverticulitis of large intestine with perforation and abscess with bleeding: Secondary | ICD-10-CM

## 2020-04-22 DIAGNOSIS — E876 Hypokalemia: Secondary | ICD-10-CM

## 2020-04-22 HISTORY — PX: COLON RESECTION: SHX5231

## 2020-04-22 LAB — CBC WITH DIFFERENTIAL/PLATELET
Abs Immature Granulocytes: 0.08 10*3/uL — ABNORMAL HIGH (ref 0.00–0.07)
Abs Immature Granulocytes: 0.1 10*3/uL — ABNORMAL HIGH (ref 0.00–0.07)
Basophils Absolute: 0 10*3/uL (ref 0.0–0.1)
Basophils Absolute: 0 10*3/uL (ref 0.0–0.1)
Basophils Relative: 0 %
Basophils Relative: 0 %
Eosinophils Absolute: 0 10*3/uL (ref 0.0–0.5)
Eosinophils Absolute: 0 10*3/uL (ref 0.0–0.5)
Eosinophils Relative: 0 %
Eosinophils Relative: 0 %
HCT: 23.6 % — ABNORMAL LOW (ref 36.0–46.0)
HCT: 28.5 % — ABNORMAL LOW (ref 36.0–46.0)
Hemoglobin: 7.7 g/dL — ABNORMAL LOW (ref 12.0–15.0)
Hemoglobin: 9.5 g/dL — ABNORMAL LOW (ref 12.0–15.0)
Immature Granulocytes: 1 %
Immature Granulocytes: 1 %
Lymphocytes Relative: 6 %
Lymphocytes Relative: 8 %
Lymphs Abs: 0.9 10*3/uL (ref 0.7–4.0)
Lymphs Abs: 0.7 10*3/uL (ref 0.7–4.0)
MCH: 26.7 pg (ref 26.0–34.0)
MCH: 29.1 pg (ref 26.0–34.0)
MCHC: 32.6 g/dL (ref 30.0–36.0)
MCHC: 33.3 g/dL (ref 30.0–36.0)
MCV: 81.9 fL (ref 80.0–100.0)
MCV: 87.4 fL (ref 80.0–100.0)
Monocytes Absolute: 0.5 10*3/uL (ref 0.1–1.0)
Monocytes Absolute: 0.2 10*3/uL (ref 0.1–1.0)
Monocytes Relative: 4 %
Monocytes Relative: 2 %
Neutro Abs: 12.7 10*3/uL — ABNORMAL HIGH (ref 1.7–7.7)
Neutro Abs: 7.4 10*3/uL (ref 1.7–7.7)
Neutrophils Relative %: 89 %
Neutrophils Relative %: 89 %
Platelets: 514 10*3/uL — ABNORMAL HIGH (ref 150–400)
Platelets: 411 10*3/uL — ABNORMAL HIGH (ref 150–400)
RBC: 2.88 MIL/uL — ABNORMAL LOW (ref 3.87–5.11)
RBC: 3.26 MIL/uL — ABNORMAL LOW (ref 3.87–5.11)
RDW: 15.7 % — ABNORMAL HIGH (ref 11.5–15.5)
RDW: 17.1 % — ABNORMAL HIGH (ref 11.5–15.5)
WBC: 14.2 10*3/uL — ABNORMAL HIGH (ref 4.0–10.5)
WBC: 8.4 10*3/uL (ref 4.0–10.5)
nRBC: 0 % (ref 0.0–0.2)
nRBC: 0 % (ref 0.0–0.2)

## 2020-04-22 LAB — BASIC METABOLIC PANEL
Anion gap: 11 (ref 5–15)
BUN: 25 mg/dL — ABNORMAL HIGH (ref 8–23)
CO2: 18 mmol/L — ABNORMAL LOW (ref 22–32)
Calcium: 8.1 mg/dL — ABNORMAL LOW (ref 8.9–10.3)
Chloride: 104 mmol/L (ref 98–111)
Creatinine, Ser: 2.5 mg/dL — ABNORMAL HIGH (ref 0.44–1.00)
GFR, Estimated: 19 mL/min — ABNORMAL LOW (ref >60)
Glucose, Bld: 142 mg/dL — ABNORMAL HIGH (ref 70–99)
Potassium: 4.3 mmol/L (ref 3.5–5.1)
Sodium: 133 mmol/L — ABNORMAL LOW (ref 135–145)

## 2020-04-22 LAB — COMPREHENSIVE METABOLIC PANEL
ALT: 14 U/L (ref 0–44)
AST: 33 U/L (ref 15–41)
Albumin: 1.8 g/dL — ABNORMAL LOW (ref 3.5–5.0)
Alkaline Phosphatase: 30 U/L — ABNORMAL LOW (ref 38–126)
Anion gap: 11 (ref 5–15)
BUN: 24 mg/dL — ABNORMAL HIGH (ref 8–23)
CO2: 17 mmol/L — ABNORMAL LOW (ref 22–32)
Calcium: 7.4 mg/dL — ABNORMAL LOW (ref 8.9–10.3)
Chloride: 109 mmol/L (ref 98–111)
Creatinine, Ser: 2.53 mg/dL — ABNORMAL HIGH (ref 0.44–1.00)
GFR, Estimated: 18 mL/min — ABNORMAL LOW (ref >60)
Glucose, Bld: 99 mg/dL (ref 70–99)
Potassium: 4.1 mmol/L (ref 3.5–5.1)
Sodium: 137 mmol/L (ref 135–145)
Total Bilirubin: 0.9 mg/dL (ref 0.3–1.2)
Total Protein: 4 g/dL — ABNORMAL LOW (ref 6.5–8.1)

## 2020-04-22 LAB — URINALYSIS, ROUTINE W REFLEX MICROSCOPIC
Bilirubin Urine: NEGATIVE
Glucose, UA: NEGATIVE mg/dL
Hgb urine dipstick: NEGATIVE
Ketones, ur: NEGATIVE mg/dL
Leukocytes,Ua: NEGATIVE
Nitrite: NEGATIVE
Protein, ur: NEGATIVE mg/dL
Specific Gravity, Urine: 1.019 (ref 1.005–1.030)
pH: 5 (ref 5.0–8.0)

## 2020-04-22 LAB — RETICULOCYTES
Immature Retic Fract: 24.5 % — ABNORMAL HIGH (ref 2.3–15.9)
RBC.: 3.75 MIL/uL — ABNORMAL LOW (ref 3.87–5.11)
Retic Count, Absolute: 83.3 10*3/uL (ref 19.0–186.0)
Retic Ct Pct: 2.2 % (ref 0.4–3.1)

## 2020-04-22 LAB — PREPARE RBC (CROSSMATCH)

## 2020-04-22 LAB — FOLATE: Folate: 11.9 ng/mL (ref >5.9)

## 2020-04-22 LAB — LACTIC ACID, PLASMA: Lactic Acid, Venous: 2.5 mmol/L (ref 0.5–1.9)

## 2020-04-22 LAB — MRSA PCR SCREENING: MRSA by PCR: NEGATIVE

## 2020-04-22 LAB — TROPONIN I (HIGH SENSITIVITY): Troponin I (High Sensitivity): 106 ng/L (ref <18)

## 2020-04-22 LAB — PROCALCITONIN: Procalcitonin: 33.28 ng/mL

## 2020-04-22 SURGERY — LAPAROSCOPIC SIGMOID COLON RESECTION
Anesthesia: General | Site: Abdomen

## 2020-04-22 MED ORDER — FENTANYL CITRATE (PF) 100 MCG/2ML IJ SOLN
25.0000 ug | INTRAMUSCULAR | Status: DC | PRN
  Administered 2020-04-22 (×2): 50 ug via INTRAVENOUS
  Administered 2020-04-22: 25 ug via INTRAVENOUS

## 2020-04-22 MED ORDER — ONDANSETRON HCL 4 MG/2ML IJ SOLN
INTRAMUSCULAR | Status: AC
  Filled 2020-04-22: qty 2

## 2020-04-22 MED ORDER — LACTATED RINGERS IR SOLN
Status: DC | PRN
  Administered 2020-04-22: 1000 mL

## 2020-04-22 MED ORDER — CALCIUM GLUCONATE-NACL 2-0.675 GM/100ML-% IV SOLN
2.0000 g | INTRAVENOUS | Status: AC
  Filled 2020-04-22: qty 100

## 2020-04-22 MED ORDER — CHLORHEXIDINE GLUCONATE 0.12 % MT SOLN
15.0000 mL | Freq: Two times a day (BID) | OROMUCOSAL | Status: DC
  Administered 2020-04-22 – 2020-04-30 (×16): 15 mL via OROMUCOSAL
  Filled 2020-04-22 (×13): qty 15

## 2020-04-22 MED ORDER — DEXAMETHASONE SODIUM PHOSPHATE 10 MG/ML IJ SOLN
INTRAMUSCULAR | Status: AC
  Filled 2020-04-22: qty 1

## 2020-04-22 MED ORDER — ORAL CARE MOUTH RINSE
15.0000 mL | Freq: Two times a day (BID) | OROMUCOSAL | Status: DC
  Administered 2020-04-23 – 2020-04-30 (×15): 15 mL via OROMUCOSAL

## 2020-04-22 MED ORDER — SUCCINYLCHOLINE CHLORIDE 200 MG/10ML IV SOSY
PREFILLED_SYRINGE | INTRAVENOUS | Status: AC
  Filled 2020-04-22: qty 10

## 2020-04-22 MED ORDER — LACTATED RINGERS IV SOLN: INTRAVENOUS | Status: DC | PRN

## 2020-04-22 MED ORDER — PROPOFOL 10 MG/ML IV BOLUS
INTRAVENOUS | Status: AC
  Filled 2020-04-22: qty 20

## 2020-04-22 MED ORDER — FENTANYL CITRATE (PF) 100 MCG/2ML IJ SOLN
INTRAMUSCULAR | Status: AC
  Filled 2020-04-22: qty 2

## 2020-04-22 MED ORDER — PROMETHAZINE HCL 25 MG/ML IJ SOLN: 6.2500 mg | INTRAMUSCULAR | Status: DC | PRN

## 2020-04-22 MED ORDER — SUGAMMADEX SODIUM 200 MG/2ML IV SOLN
INTRAVENOUS | Status: DC | PRN
  Administered 2020-04-22: 200 mg via INTRAVENOUS

## 2020-04-22 MED ORDER — ALBUMIN HUMAN 5 % IV SOLN
INTRAVENOUS | Status: DC | PRN
Start: 2020-04-22 — End: 2020-04-22

## 2020-04-22 MED ORDER — SODIUM CHLORIDE 0.9 % IV BOLUS
1000.0000 mL | Freq: Once | INTRAVENOUS | Status: AC
  Administered 2020-04-22: 22:00:00 1000 mL via INTRAVENOUS

## 2020-04-22 MED ORDER — SODIUM CHLORIDE 0.9 % IV SOLN: INTRAVENOUS | Status: DC | PRN

## 2020-04-22 MED ORDER — CHLORHEXIDINE GLUCONATE CLOTH 2 % EX PADS
6.0000 | MEDICATED_PAD | Freq: Every day | CUTANEOUS | Status: DC
  Administered 2020-04-22 – 2020-04-29 (×8): 6 via TOPICAL

## 2020-04-22 MED ORDER — PHENYLEPHRINE HCL (PRESSORS) 10 MG/ML IV SOLN
INTRAVENOUS | Status: AC
  Filled 2020-04-22: qty 1

## 2020-04-22 MED ORDER — DEXAMETHASONE SODIUM PHOSPHATE 10 MG/ML IJ SOLN
INTRAMUSCULAR | Status: DC | PRN
  Administered 2020-04-22: 5 mg via INTRAVENOUS

## 2020-04-22 MED ORDER — BUPIVACAINE-EPINEPHRINE 0.25% -1:200000 IJ SOLN
INTRAMUSCULAR | Status: DC | PRN
  Administered 2020-04-22: 20 mL

## 2020-04-22 MED ORDER — LACTATED RINGERS IV SOLN: INTRAVENOUS | Status: DC

## 2020-04-22 MED ORDER — LIDOCAINE HCL (CARDIAC) PF 100 MG/5ML IV SOSY
PREFILLED_SYRINGE | INTRAVENOUS | Status: DC | PRN
  Administered 2020-04-22: 60 mg via INTRAVENOUS

## 2020-04-22 MED ORDER — OXYCODONE HCL 5 MG PO TABS: 5.0000 mg | ORAL_TABLET | Freq: Once | ORAL | Status: DC | PRN

## 2020-04-22 MED ORDER — PHENYLEPHRINE HCL-NACL 10-0.9 MG/250ML-% IV SOLN
INTRAVENOUS | Status: DC | PRN
  Administered 2020-04-22 (×2): 75 ug/min via INTRAVENOUS

## 2020-04-22 MED ORDER — 0.9 % SODIUM CHLORIDE (POUR BTL) OPTIME
TOPICAL | Status: DC | PRN
  Administered 2020-04-22: 14:00:00 7000 mL

## 2020-04-22 MED ORDER — LIP MEDEX EX OINT: TOPICAL_OINTMENT | CUTANEOUS | Status: DC | PRN

## 2020-04-22 MED ORDER — FENTANYL CITRATE (PF) 100 MCG/2ML IJ SOLN
INTRAMUSCULAR | Status: DC | PRN
  Administered 2020-04-22: 50 ug via INTRAVENOUS
  Administered 2020-04-22 (×2): 25 ug via INTRAVENOUS

## 2020-04-22 MED ORDER — ACETAMINOPHEN 500 MG PO TABS
1000.0000 mg | ORAL_TABLET | Freq: Once | ORAL | Status: AC
  Administered 2020-04-22: 11:00:00 1000 mg via ORAL

## 2020-04-22 MED ORDER — OXYCODONE HCL 5 MG/5ML PO SOLN
5.0000 mg | Freq: Once | ORAL | Status: DC | PRN
Start: 2020-04-22 — End: 2020-04-22

## 2020-04-22 MED ORDER — ACETAMINOPHEN 500 MG PO TABS
ORAL_TABLET | ORAL | Status: AC
  Filled 2020-04-22: qty 2

## 2020-04-22 MED ORDER — CALCIUM GLUCONATE 10 % IV SOLN
INTRAVENOUS | Status: DC | PRN
  Administered 2020-04-22: 1 g via INTRAVENOUS

## 2020-04-22 MED ORDER — PROPOFOL 10 MG/ML IV BOLUS
INTRAVENOUS | Status: DC | PRN
  Administered 2020-04-22: 70 mg via INTRAVENOUS

## 2020-04-22 MED ORDER — PHENYLEPHRINE 40 MCG/ML (10ML) SYRINGE FOR IV PUSH (FOR BLOOD PRESSURE SUPPORT)
PREFILLED_SYRINGE | INTRAVENOUS | Status: DC | PRN
  Administered 2020-04-22 (×2): 200 ug via INTRAVENOUS
  Administered 2020-04-22: 160 ug via INTRAVENOUS
  Administered 2020-04-22 (×8): 120 ug via INTRAVENOUS

## 2020-04-22 MED ORDER — ONDANSETRON HCL 4 MG/2ML IJ SOLN
INTRAMUSCULAR | Status: DC | PRN
  Administered 2020-04-22: 4 mg via INTRAVENOUS

## 2020-04-22 MED ORDER — PHENOL 1.4 % MT LIQD
1.0000 | OROMUCOSAL | Status: DC | PRN
  Administered 2020-04-22: 21:00:00 1 via OROMUCOSAL
  Filled 2020-04-22: qty 177

## 2020-04-22 MED ORDER — ROCURONIUM BROMIDE 100 MG/10ML IV SOLN
INTRAVENOUS | Status: DC | PRN
  Administered 2020-04-22 (×2): 20 mg via INTRAVENOUS
  Administered 2020-04-22: 40 mg via INTRAVENOUS
  Administered 2020-04-22: 20 mg via INTRAVENOUS

## 2020-04-22 MED ORDER — FENTANYL CITRATE (PF) 100 MCG/2ML IJ SOLN
INTRAMUSCULAR | Status: AC
  Administered 2020-04-22: 17:00:00 25 ug via INTRAVENOUS
  Filled 2020-04-22: qty 2

## 2020-04-22 MED ORDER — ROCURONIUM BROMIDE 10 MG/ML (PF) SYRINGE
PREFILLED_SYRINGE | INTRAVENOUS | Status: AC
  Filled 2020-04-22: qty 10

## 2020-04-22 MED ORDER — SODIUM CHLORIDE 0.9 % IV SOLN: INTRAVENOUS | Status: DC

## 2020-04-22 SURGICAL SUPPLY — 81 items
APL PRP STRL LF DISP 70% ISPRP (MISCELLANEOUS) ×1
APL SKNCLS STERI-STRIP NONHPOA (GAUZE/BANDAGES/DRESSINGS)
APPLIER CLIP 5 13 M/L LIGAMAX5 (MISCELLANEOUS)
APPLIER CLIP ROT 10 11.4 M/L (STAPLE)
APR CLP MED LRG 11.4X10 (STAPLE)
APR CLP MED LRG 5 ANG JAW (MISCELLANEOUS)
BENZOIN TINCTURE PRP APPL 2/3 (GAUZE/BANDAGES/DRESSINGS) IMPLANT
BLADE EXTENDED COATED 6.5IN (ELECTRODE) ×2 IMPLANT
BNDG ADH 1X3 SHEER STRL LF (GAUZE/BANDAGES/DRESSINGS) IMPLANT
BNDG ADH THN 3X1 STRL LF (GAUZE/BANDAGES/DRESSINGS)
BNDG GAUZE ELAST 4 BULKY (GAUZE/BANDAGES/DRESSINGS) ×4 IMPLANT
CABLE HIGH FREQUENCY MONO STRZ (ELECTRODE) ×2 IMPLANT
CELLS DAT CNTRL 66122 CELL SVR (MISCELLANEOUS) IMPLANT
CHLORAPREP W/TINT 26 (MISCELLANEOUS) ×2 IMPLANT
CLIP APPLIE 5 13 M/L LIGAMAX5 (MISCELLANEOUS) IMPLANT
CLIP APPLIE ROT 10 11.4 M/L (STAPLE) IMPLANT
COUNTER NEEDLE 20 DBL MAG RED (NEEDLE) ×2 IMPLANT
COVER MAYO STAND STRL (DRAPES) ×2 IMPLANT
COVER SURGICAL LIGHT HANDLE (MISCELLANEOUS) ×2 IMPLANT
COVER WAND RF STERILE (DRAPES) IMPLANT
DECANTER SPIKE VIAL GLASS SM (MISCELLANEOUS) ×2 IMPLANT
DRAIN CHANNEL 19F RND (DRAIN) IMPLANT
DRAPE LAPAROSCOPIC ABDOMINAL (DRAPES) ×2 IMPLANT
DRAPE UTILITY XL STRL (DRAPES) ×2 IMPLANT
DRSG OPSITE POSTOP 4X10 (GAUZE/BANDAGES/DRESSINGS) IMPLANT
DRSG OPSITE POSTOP 4X6 (GAUZE/BANDAGES/DRESSINGS) IMPLANT
DRSG OPSITE POSTOP 4X8 (GAUZE/BANDAGES/DRESSINGS) IMPLANT
ELECT REM PT RETURN 15FT ADLT (MISCELLANEOUS) ×2 IMPLANT
ENDOLOOP SUT PDS II  0 18 (SUTURE)
ENDOLOOP SUT PDS II 0 18 (SUTURE) IMPLANT
GAUZE SPONGE 4X4 12PLY STRL (GAUZE/BANDAGES/DRESSINGS) ×4 IMPLANT
GLOVE BIOGEL PI IND STRL 7.0 (GLOVE) ×2 IMPLANT
GLOVE BIOGEL PI INDICATOR 7.0 (GLOVE) ×2
GLOVE SURG SS PI 7.0 STRL IVOR (GLOVE) ×4 IMPLANT
GOWN STRL REUS W/TWL XL LVL3 (GOWN DISPOSABLE) ×8 IMPLANT
HANDLE SUCTION POOLE (INSTRUMENTS) ×1 IMPLANT
IRRIG SUCT STRYKERFLOW 2 WTIP (MISCELLANEOUS) ×2
IRRIGATION SUCT STRKRFLW 2 WTP (MISCELLANEOUS) ×1 IMPLANT
KIT TURNOVER KIT A (KITS) ×2 IMPLANT
LEGGING LITHOTOMY PAIR STRL (DRAPES) IMPLANT
LIGASURE IMPACT 36 18CM CVD LR (INSTRUMENTS) ×2 IMPLANT
PACK COLON (CUSTOM PROCEDURE TRAY) ×2 IMPLANT
PAD POSITIONING PINK XL (MISCELLANEOUS) ×2 IMPLANT
PENCIL SMOKE EVACUATOR (MISCELLANEOUS) IMPLANT
PROTECTOR NERVE ULNAR (MISCELLANEOUS) ×2 IMPLANT
RELOAD PROXIMATE 75MM BLUE (ENDOMECHANICALS) ×2 IMPLANT
RELOAD STAPLER WHITE 60MM (STAPLE) IMPLANT
RTRCTR WOUND ALEXIS 18CM MED (MISCELLANEOUS)
SCISSORS LAP 5X35 DISP (ENDOMECHANICALS) ×2 IMPLANT
SEALER TISSUE G2 STRG ARTC 35C (ENDOMECHANICALS) ×2 IMPLANT
SET TUBE SMOKE EVAC HIGH FLOW (TUBING) ×2 IMPLANT
SLEEVE XCEL OPT CAN 5 100 (ENDOMECHANICALS) ×6 IMPLANT
STAPLER CVD CUT GN 40 RELOAD (ENDOMECHANICALS) ×2 IMPLANT
STAPLER ECHELON LONG 60 440 (INSTRUMENTS) IMPLANT
STAPLER PROXIMATE 75MM BLUE (STAPLE) ×2 IMPLANT
STAPLER RELOAD WHITE 60MM (STAPLE)
STAPLER VISISTAT 35W (STAPLE) ×2 IMPLANT
STRIP CLOSURE SKIN 1/2X4 (GAUZE/BANDAGES/DRESSINGS) IMPLANT
SUCTION POOLE HANDLE (INSTRUMENTS) ×2
SUT ETHILON 2 0 PS N (SUTURE) ×2 IMPLANT
SUT PDS AB 0 CT1 36 (SUTURE) IMPLANT
SUT PROLENE 2 0 CT2 30 (SUTURE) ×2 IMPLANT
SUT PROLENE 2 0 KS (SUTURE) IMPLANT
SUT SILK 2 0 (SUTURE) ×2
SUT SILK 2 0 SH CR/8 (SUTURE) ×2 IMPLANT
SUT SILK 2-0 18XBRD TIE 12 (SUTURE) ×1 IMPLANT
SUT SILK 3 0 (SUTURE) ×2
SUT SILK 3 0 SH CR/8 (SUTURE) ×2 IMPLANT
SUT SILK 3-0 18XBRD TIE 12 (SUTURE) ×1 IMPLANT
SUT VIC AB 2-0 SH 27 (SUTURE)
SUT VIC AB 2-0 SH 27X BRD (SUTURE) IMPLANT
SUT VIC AB 3-0 SH 18 (SUTURE) ×4 IMPLANT
SUT VICRYL 0 ENDOLOOP (SUTURE) IMPLANT
SYS LAPSCP GELPORT 120MM (MISCELLANEOUS)
SYSTEM LAPSCP GELPORT 120MM (MISCELLANEOUS) IMPLANT
TAPE CLOTH 4X10 WHT NS (GAUZE/BANDAGES/DRESSINGS) IMPLANT
TOWEL OR 17X26 10 PK STRL BLUE (TOWEL DISPOSABLE) ×2 IMPLANT
TOWEL OR NON WOVEN STRL DISP B (DISPOSABLE) ×2 IMPLANT
TRAY FOLEY MTR SLVR 16FR STAT (SET/KITS/TRAYS/PACK) ×2 IMPLANT
TROCAR BLADELESS OPT 5 100 (ENDOMECHANICALS) ×2 IMPLANT
TROCAR XCEL 12X100 BLDLESS (ENDOMECHANICALS) ×2 IMPLANT

## 2020-04-22 NOTE — Progress Notes (Signed)
PROGRESS NOTE    Martha Ortega  HUD:[id number] DOB: February 08, 1935 DOA: 03/27/2020 PCP: [name], MD    Chief Complaint  Patient presents with  . Blood In Stools  . Abdominal Pain    Brief Narrative:  Patient is 85 year old female with history of nonhemorrhagic CVA, CKD stage IV, hypertension, hyperlipidemia, diverticulitis, status post appendicectomy and hysterectomy who presented to the emergency room with 2 weeks history of intermittent left lower quadrant pain, blood in the stool.  She was started on Augmentin as an outpatient for presumed diverticulitis by her PCP but it did not help.  On presentation, she was tachycardic and tachypneic, had leukocytosis, lactic acidosis.  Imaging showed recurrent descending colon diverticulitis complicated by sinus tract and collection in the lower paracolic catheter.  She was started on IV antibiotics, GI and general surgery consulted.  She had a prolonged hospital course was persistent abdominal pain, no bowel movement.    Assessment & Plan:   Principal Problem:   Diverticulitis of sigmoid colon with abscess Active Problems:   Hyperlipidemia   CKD (chronic kidney disease), stage IV (HCC)   Aortic atherosclerosis (HCC)   Blood in stool   ABLA (acute blood loss anemia)   Pericolonic abscess due to diverticulitis   Large bowel obstruction (HCC)   Cerebrovascular accident (CVA) ([place])   Hypomagnesemia   Hypokalemia   Sepsis ([place])   #1 sepsis likely secondary to recurrent diverticulitis/sigmoid stricture, POA Patient met criteria for sepsis with tachycardia, tachypnea, leukocytosis.  Patient pancultured with cultures pending.  Patient with improvement with sepsis physiology.  Continue empiric IV antibiotics.  General surgery following.  Follow.  2.  Recurrent diverticulitis with sigmoid stricture Complicated by sinus tract and fluid collections in the lower paracolic gutter.  Repeat CT abdomen and pelvis done April 18, 2020 with  sigmoid diverticulosis with marked wall thickening and pericolic inflammatory changes of the proximal sigmoid colon, no evidence of abscess or free intraperitoneal perforation.  Leukocytosis trended down.  Patient noted not to have had a bowel movement in several days despite MiraLAX and suppository.  Patient underwent abdominal films with residual contrast within the colon proximal to the sigmoid reflecting a degree of persistent obstruction, distention of the cecum to 9.7 cm.  Patient seen by general surgery this morning was taken to the OR where she underwent total abdominal colectomy with end ileostomy, repair of stomach injury secondary to sigmoid stricture, dilated proximal colon with contained perforation per Dr. Kieth Brightly.  Patient on IV Zosyn.  Patient being transferred to the stepdown unit for closer monitoring per general surgery.  General surgery following and appreciate input and recommendations.  3.  Entrapment of left ureter Patient noted to have possible left ureteral injury with entrapment of left ureter.  Urology was consulted during surgery and patient underwent left ureteral exploration, left ureterolysis per Dr. Diona Fanti 04/01/2020.  Per urology.  4.  Hypokalemia/hypomagnesemia Repleted.  Repeat labs in the morning.  5.  Normocytic anemia/suspected GI bleed Hemoglobin noted to be in the range of 7 which has remained stable.  Anemia panel done with low iron patient status post IV iron infusion.  Patient noted to have had hematochezia at home likely associated with acute recurrent diverticulitis.  GI was consulted during this hospitalization and no plans for intervention at this time.  Transfusion threshold hemoglobin <7.  Outpatient follow-up with GI.  6.  Hypertension Stable.  Continue current regimen of metoprolol.  Hydralazine and Cozaar on hold.  Follow.  7.  History  of nonhemorrhagic CVA Noted to be on aspirin and statin at home prior to admission.  Aspirin on hold due to  concern for possible GI bleed.  Continue statin.  Follow.  8.  Chronic kidney disease stage IV Currently at baseline.  Cozaar could be discontinued on discharge.    DVT prophylaxis: SCDs Code Status: Full Family Communication: No family at bedside. Disposition:   Status is: Inpatient    Dispo: The patient is from: Home              Anticipated d/c is to: To be determined              Anticipated d/c date is: 5 to 6 days.              Patient currently postop, being transferred to the stepdown unit, not stable for discharge.       Consultants:   Gastroenterology: Dr. Havery Moros 04/16/2020  General surgery: Dr. Redmond Pulling 04/18/2020  Procedures:   CT abdomen and pelvis 04/16/2020, 04/18/2020  Abdominal films 04/01/2020  2 D ECHO 04/21/2020  Total abd colectomy with end ileostomy, repair of stomach injury --per Dr Kieth Brightly 03/28/2020  Left ureteral exploration and left ureterolysis --Per urology Dr Diona Fanti 04/09/2020  Antimicrobials:   IV Zosyn 04/20/2020>>>>   Subjective: Sedated.  In PACU.  Just finished surgery.  On bear hugger.  Patient aroused briefly and asked whether surgery was over.  Objective: Vitals:   04/20/2020 1045 04/20/2020 1046 04/13/2020 1115 04/17/2020 1600  BP: 140/79   (!) 151/124  Pulse: (!) 118  (!) 114 61  Resp: (!) 26  20 (!) 27  Temp:    97.8 F (36.6 C)  TempSrc:      SpO2: (!) 87% 94% 97% 95%  Weight:      Height:        Intake/Output Summary (Last 24 hours) at 04/19/2020 1655 Last data filed at 03/29/2020 1543 Gross per 24 hour  Intake 3767 ml  Output 425 ml  Net 3342 ml   Filed Weights   03/27/2020 1333  Weight: 59.9 kg    Examination:  General exam: Sedated.  Postop.  On bear hugger. Respiratory system: Clear to auscultation on anterior lung fields.Marland Kitchen Respiratory effort normal. Cardiovascular system: Tachycardia. No JVD, murmurs, rubs, gallops or clicks. No pedal edema. Gastrointestinal system: Abdomen is nondistended,  soft, JP drain in place, and ileostomy noted, hypoactive bowel sounds.  Central nervous system: Sedated postop. No focal neurological deficits. Extremities: Symmetric 5 x 5 power. Skin: No rashes, lesions or ulcers Psychiatry: Judgement and insight unable to assess as patient sedated. Mood & affect appropriate.     Data Reviewed: I have personally reviewed following labs and imaging studies  CBC: Recent Labs  Lab 04/18/20 0509 04/19/20 0505 04/20/20 0417 04/21/20 0549 04/19/2020 0442  WBC 10.2 12.9* 12.3* 11.6* 14.2*  NEUTROABS  --   --  8.9* 9.4* 12.7*  HGB 7.6* 7.4* 7.4* 7.2* 7.7*  HCT 23.6* 22.9* 23.1* 22.0* 23.6*  MCV 86.1 83.9 84.9 83.7 81.9  PLT 433* 434* 472* 430* 514*    Basic Metabolic Panel: Recent Labs  Lab 04/18/20 0509 04/19/20 0505 04/20/20 0417 04/21/20 0549 04/05/2020 0442  NA 141 140 139 139 133*  K 3.2* 4.2 3.9 4.4 4.3  CL 109 110 109 108 104  CO2 20* 20* 19* 19* 18*  GLUCOSE 116* 109* 104* 102* 142*  BUN _0 25*  CREATININE 1.86* 2.04* 1.96* 2.03* 2.50*  CALCIUM 8.3*  8.4* 8.2* 8.3* 8.1*  MG  --  1.7 1.8  --   --     GFR: Estimated Creatinine Clearance: 13.5 mL/min (A) (by C-G formula based on SCr of 2.5 mg/dL (H)).  Liver Function Tests: No results for input(s): AST, ALT, ALKPHOS, BILITOT, PROT, ALBUMIN in the last 168 hours.  CBG: No results for input(s): GLUCAP in the last 168 hours.   Recent Results (from the past 240 hour(s))  Resp Panel by RT-PCR (Flu A&B, Covid) Nasopharyngeal Swab     Status: None   Collection Time: 04/21/2020  9:13 AM   Specimen: Nasopharyngeal Swab; Nasopharyngeal(NP) swabs in vial transport medium  Result Value Ref Range Status   SARS Coronavirus 2 by RT PCR NEGATIVE NEGATIVE Final    Comment: (NOTE) SARS-CoV-2 target nucleic acids are NOT DETECTED.  The SARS-CoV-2 RNA is generally detectable in upper respiratory specimens during the acute phase of infection. The lowest concentration of SARS-CoV-2 viral  copies this assay can detect is 138 copies/mL. A negative result does not preclude SARS-Cov-2 infection and should not be used as the sole basis for treatment or other patient management decisions. A negative result may occur with  improper specimen collection/handling, submission of specimen other than nasopharyngeal swab, presence of viral mutation(s) within the areas targeted by this assay, and inadequate number of viral copies(<138 copies/mL). A negative result must be combined with clinical observations, patient history, and epidemiological information. The expected result is Negative.  Fact Sheet for Patients:  EntrepreneurPulse.com.au  Fact Sheet for Healthcare Providers:  IncredibleEmployment.be  This test is no t yet approved or cleared by the [place] FDA and  has been authorized for detection and/or diagnosis of SARS-CoV-2 by FDA under an Emergency Use Authorization (EUA). This EUA will remain  in effect (meaning this test can be used) for the duration of the COVID-19 declaration under Section 564(b)(1) of the Act, 21 U.S.C.section 360bbb-3(b)(1), unless the authorization is terminated  or revoked sooner.       Influenza A by PCR NEGATIVE NEGATIVE Final   Influenza B by PCR NEGATIVE NEGATIVE Final    Comment: (NOTE) The Xpert Xpress SARS-CoV-2/FLU/RSV plus assay is intended as an aid in the diagnosis of influenza from Nasopharyngeal swab specimens and should not be used as a sole basis for treatment. Nasal washings and aspirates are unacceptable for Xpert Xpress SARS-CoV-2/FLU/RSV testing.  Fact Sheet for Patients: EntrepreneurPulse.com.au  Fact Sheet for Healthcare Providers: IncredibleEmployment.be  This test is not yet approved or cleared by the [place] FDA and has been authorized for detection and/or diagnosis of SARS-CoV-2 by FDA under an Emergency Use Authorization (EUA). This  EUA will remain in effect (meaning this test can be used) for the duration of the COVID-19 declaration under Section 564(b)(1) of the Act, 21 U.S.C. section 360bbb-3(b)(1), unless the authorization is terminated or revoked.  Performed at [facility], [place] [address], [place], [place] [address]          Radiology Studies: DG Abd Portable 1V  Result Date: 04/01/2020 CLINICAL DATA:  Large bowel obstruction EXAM: PORTABLE ABDOMEN - 1 VIEW COMPARISON:  None. FINDINGS: Residual contrast within cecum, ascending colon, transverse colon, and descending colon as well as distal small bowel. There is distension of the cecum to 9.7 cm. IMPRESSION: Residual contrast within the colon proximal to the sigmoid reflecting a degree of persistent obstruction. Distension of the cecum to 9.7 cm. Electronically Signed   By: Macy Mis M.D.   On: 04/03/2020  09:27   ECHOCARDIOGRAM COMPLETE  Result Date: 04/21/2020    ECHOCARDIOGRAM REPORT   Patient Name:   ARRIAH WADLE [facility] Date of Exam: 04/21/2020 Medical Rec #:  [id number]        Height:       62.0 in Accession #:    [id number]       Weight:       132.0 lb Date of Birth:  [birth year]       BSA:          1.602 m Patient Age:    41 years         BP:           137/77 mmHg Patient Gender: F                HR:           110 bpm. Exam Location:  Inpatient Procedure: 2D Echo Indications:    Chest Pain  History:        Patient has no prior history of Echocardiogram examinations.  Sonographer:    Mikki Santee RDCS (AE) Referring Phys: [id number] AMRIT ADHIKARI IMPRESSIONS  1. Left ventricular ejection fraction, by estimation, is 60 to 65%. The left ventricle has normal function. The left ventricle has no regional wall motion abnormalities. There is mild concentric left ventricular hypertrophy. Left ventricular diastolic parameters are consistent with Grade I diastolic dysfunction (impaired relaxation).  2. Right ventricular systolic function is normal.  The right ventricular size is normal. There is normal pulmonary artery systolic pressure. The estimated right ventricular systolic pressure is 56.3 mmHg.  3. The mitral valve is normal in structure. No evidence of mitral valve regurgitation. No evidence of mitral stenosis.  4. The aortic valve is normal in structure. Aortic valve regurgitation is not visualized. No aortic stenosis is present.  5. The inferior vena cava is normal in size with greater than 50% respiratory variability, suggesting right atrial pressure of 3 mmHg. FINDINGS  Left Ventricle: Left ventricular ejection fraction, by estimation, is 60 to 65%. The left ventricle has normal function. The left ventricle has no regional wall motion abnormalities. The left ventricular internal cavity size was normal in size. There is  mild concentric left ventricular hypertrophy. Left ventricular diastolic parameters are consistent with Grade I diastolic dysfunction (impaired relaxation). Normal left ventricular filling pressure. Right Ventricle: The right ventricular size is normal. No increase in right ventricular wall thickness. Right ventricular systolic function is normal. There is normal pulmonary artery systolic pressure. The tricuspid regurgitant velocity is 2.63 m/s, and  with an assumed right atrial pressure of 3 mmHg, the estimated right ventricular systolic pressure is 14.9 mmHg. Left Atrium: Left atrial size was normal in size. Right Atrium: Right atrial size was normal in size. Pericardium: There is no evidence of pericardial effusion. Mitral Valve: The mitral valve is normal in structure. No evidence of mitral valve regurgitation. No evidence of mitral valve stenosis. Tricuspid Valve: The tricuspid valve is normal in structure. Tricuspid valve regurgitation is mild . No evidence of tricuspid stenosis. Aortic Valve: The aortic valve is normal in structure. Aortic valve regurgitation is not visualized. No aortic stenosis is present. Pulmonic Valve: The  pulmonic valve was normal in structure. Pulmonic valve regurgitation is not visualized. No evidence of pulmonic stenosis. Aorta: The aortic root is normal in size and structure. Venous: The inferior vena cava is normal in size with greater than 50% respiratory variability, suggesting right atrial pressure of 3 mmHg. IAS/Shunts: No atrial  level shunt detected by color flow Doppler.  LEFT VENTRICLE PLAX 2D LVIDd:         2.70 cm  Diastology LVIDs:         2.00 cm  LV e' medial:    5.00 cm/s LV PW:         1.30 cm  LV E/e' medial:  10.9 LV IVS:        1.30 cm  LV e' lateral:   7.94 cm/s LVOT diam:     2.20 cm  LV E/e' lateral: 6.8 LV SV:         81 LV SV Index:   51 LVOT Area:     3.80 cm  LEFT ATRIUM           Index       RIGHT ATRIUM           Index LA diam:      2.70 cm 1.69 cm/m  RA Area:     13.30 cm LA Vol (A2C): 31.9 ml 19.91 ml/m RA Volume:   28.40 ml  17.73 ml/m LA Vol (A4C): 38.6 ml 24.09 ml/m  AORTIC VALVE LVOT Vmax:   112.00 cm/s LVOT Vmean:  68.000 cm/s LVOT VTI:    0.213 m  AORTA Ao Root diam: 2.80 cm MITRAL VALVE               TRICUSPID VALVE MV Area (PHT): 4.75 cm    TR Peak grad:   27.7 mmHg MV Decel Time: 160 msec    TR Vmax:        263.00 cm/s MV E velocity: 54.33 cm/s MV A velocity: 98.47 cm/s  SHUNTS MV E/A ratio:  0.55        Systemic VTI:  0.21 m                            Systemic Diam: 2.20 cm Dani Gobble Croitoru MD Electronically signed by Sanda Klein MD Signature Date/Time: 04/21/2020/2:59:10 PM    Final         Scheduled Meds: . acetaminophen      . [MAR Hold] bisacodyl  10 mg Rectal Daily  . [MAR Hold] feeding supplement  237 mL Oral BID BM  . fentaNYL      . [MAR Hold] lip balm  1 application Topical BID  . [MAR Hold] metoprolol succinate  25 mg Oral Daily  . [MAR Hold] polyethylene glycol  17 g Oral BID  . [MAR Hold] rosuvastatin  20 mg Oral Daily   Continuous Infusions: . sodium chloride 75 mL/hr at 04/15/2020 0952  . calcium gluconate    . [MAR Hold] lactated  ringers    . lactated ringers 75 mL/hr at 04/09/2020 1042  . [MAR Hold] methocarbamol (ROBAXIN) IV    . [MAR Hold] piperacillin-tazobactam 2.25 g (04/08/2020 0407)     LOS: 8 days    Time spent: 35 minutes    Irine Seal, MD [facility]   To contact the attending provider between 7A-7P or the covering provider during after hours 7P-7A, please log into the web site [email or web address] and access using universal [place] password for that web site. If you do not have the password, please call the hospital operator.  03/31/2020, 4:55 PM

## 2020-04-22 NOTE — Progress Notes (Signed)
Progress Note     Subjective: Patient reports small BM that was combined liquid and solid material early yesterday evening. She reports her abdomen feels better but still she has no appetite and did not take in much PO yesterday. She denies nausea or vomiting but reports the need to keep emesis bag in bed with her. Abdomen feels somewhat bloated still.   ECHO done yesterday and shows EF 60-65% with grade I diastolic dysfunction.   Objective: Vital signs in last 24 hours: Temp:  [97.7 F (36.5 C)-98.8 F (37.1 C)] 98.8 F (37.1 C) (12/29 0510) Pulse Rate:  [110-116] 114 (12/29 0510) Resp:  [16-20] 20 (12/29 0510) BP: (109-139)/(61-80) 115/61 (12/29 0510) SpO2:  [95 %-97 %] 96 % (12/29 0510) Last BM Date: 04/17/20  Intake/Output from previous day: 12/28 0701 - 12/29 0700 In: 700 [P.O.:600; IV Piggyback:100] Out: -  Intake/Output this shift: No intake/output data recorded.  PE: General: pleasant, WD, elderly female who is laying in bed in NAD Heart: regular, rate, and rhythm.  Normal s1,s2. No obvious murmurs, gallops, or rubs noted.  Palpable radial and pedal pulses bilaterally Lungs: CTAB, no wheezes, rhonchi, or rales noted.  Respiratory effort nonlabored Abd: soft, mild diffuse ttp, moderately distended, BS hypoactive, no masses, hernias, or organomegaly     Lab Results:  Recent Labs    04/21/20 0549 04/04/2020 0442  WBC 11.6* 14.2*  HGB 7.2* 7.7*  HCT 22.0* 23.6*  PLT 430* 514*   BMET Recent Labs    04/21/20 0549 04/07/2020 0442  NA 139 133*  K 4.4 4.3  CL 108 104  CO2 19* 18*  GLUCOSE 102* 142*  BUN 19 25*  CREATININE 2.03* 2.50*  CALCIUM 8.3* 8.1*   PT/INR No results for input(s): LABPROT, INR in the last 72 hours. CMP     Component Value Date/Time   NA 133 (L) 04/24/2020 0442   K 4.3 04/13/2020 0442   CL 104 04/02/2020 0442   CO2 18 (L) 03/31/2020 0442   GLUCOSE 142 (H) 04/23/2020 0442   BUN 25 (H) 04/07/2020 0442   CREATININE 2.50 (H)  04/17/2020 0442   CALCIUM 8.1 (L) 04/04/2020 0442   PROT 6.7 04/01/2020 0910   ALBUMIN 2.7 (L) 04/13/2020 0910   AST 26 04/05/2020 0910   ALT 10 04/19/2020 0910   ALKPHOS 61 04/20/2020 0910   BILITOT 1.2 04/07/2020 0910   GFRNONAA 19 (L) 04/09/2020 0442   GFRAA 31 (L) 07/18/2016 0912   Lipase     Component Value Date/Time   LIPASE 32.0 04/06/2020 1535       Studies/Results: ECHOCARDIOGRAM COMPLETE  Result Date: 04/21/2020    ECHOCARDIOGRAM REPORT   Patient Name:   [facility] Date of Exam: 04/21/2020 Medical Rec #:  [id number]        Height:       62.0 in Accession #:    [id number]       Weight:       132.0 lb Date of Birth:  April 24, 1935       BSA:          1.602 m Patient Age:    85 years         BP:           137/77 mmHg Patient Gender: F                HR:           110 bpm. Exam Location:  Inpatient Procedure:  2D Echo Indications:    Chest Pain  History:        Patient has no prior history of Echocardiogram examinations.  Sonographer:    Mikki Santee RDCS (AE) Referring Phys: [id number] AMRIT ADHIKARI IMPRESSIONS  1. Left ventricular ejection fraction, by estimation, is 60 to 65%. The left ventricle has normal function. The left ventricle has no regional wall motion abnormalities. There is mild concentric left ventricular hypertrophy. Left ventricular diastolic parameters are consistent with Grade I diastolic dysfunction (impaired relaxation).  2. Right ventricular systolic function is normal. The right ventricular size is normal. There is normal pulmonary artery systolic pressure. The estimated right ventricular systolic pressure is 93.7 mmHg.  3. The mitral valve is normal in structure. No evidence of mitral valve regurgitation. No evidence of mitral stenosis.  4. The aortic valve is normal in structure. Aortic valve regurgitation is not visualized. No aortic stenosis is present.  5. The inferior vena cava is normal in size with greater than 50% respiratory variability, suggesting  right atrial pressure of 3 mmHg. FINDINGS  Left Ventricle: Left ventricular ejection fraction, by estimation, is 60 to 65%. The left ventricle has normal function. The left ventricle has no regional wall motion abnormalities. The left ventricular internal cavity size was normal in size. There is  mild concentric left ventricular hypertrophy. Left ventricular diastolic parameters are consistent with Grade I diastolic dysfunction (impaired relaxation). Normal left ventricular filling pressure. Right Ventricle: The right ventricular size is normal. No increase in right ventricular wall thickness. Right ventricular systolic function is normal. There is normal pulmonary artery systolic pressure. The tricuspid regurgitant velocity is 2.63 m/s, and  with an assumed right atrial pressure of 3 mmHg, the estimated right ventricular systolic pressure is 90.2 mmHg. Left Atrium: Left atrial size was normal in size. Right Atrium: Right atrial size was normal in size. Pericardium: There is no evidence of pericardial effusion. Mitral Valve: The mitral valve is normal in structure. No evidence of mitral valve regurgitation. No evidence of mitral valve stenosis. Tricuspid Valve: The tricuspid valve is normal in structure. Tricuspid valve regurgitation is mild . No evidence of tricuspid stenosis. Aortic Valve: The aortic valve is normal in structure. Aortic valve regurgitation is not visualized. No aortic stenosis is present. Pulmonic Valve: The pulmonic valve was normal in structure. Pulmonic valve regurgitation is not visualized. No evidence of pulmonic stenosis. Aorta: The aortic root is normal in size and structure. Venous: The inferior vena cava is normal in size with greater than 50% respiratory variability, suggesting right atrial pressure of 3 mmHg. IAS/Shunts: No atrial level shunt detected by color flow Doppler.  LEFT VENTRICLE PLAX 2D LVIDd:         2.70 cm  Diastology LVIDs:         2.00 cm  LV e' medial:    5.00 cm/s LV  PW:         1.30 cm  LV E/e' medial:  10.9 LV IVS:        1.30 cm  LV e' lateral:   7.94 cm/s LVOT diam:     2.20 cm  LV E/e' lateral: 6.8 LV SV:         81 LV SV Index:   51 LVOT Area:     3.80 cm  LEFT ATRIUM           Index       RIGHT ATRIUM           Index LA diam:  2.70 cm 1.69 cm/m  RA Area:     13.30 cm LA Vol (A2C): 31.9 ml 19.91 ml/m RA Volume:   28.40 ml  17.73 ml/m LA Vol (A4C): 38.6 ml 24.09 ml/m  AORTIC VALVE LVOT Vmax:   112.00 cm/s LVOT Vmean:  68.000 cm/s LVOT VTI:    0.213 m  AORTA Ao Root diam: 2.80 cm MITRAL VALVE               TRICUSPID VALVE MV Area (PHT): 4.75 cm    TR Peak grad:   27.7 mmHg MV Decel Time: 160 msec    TR Vmax:        263.00 cm/s MV E velocity: 54.33 cm/s MV A velocity: 98.47 cm/s  SHUNTS MV E/A ratio:  0.55        Systemic VTI:  0.21 m                            Systemic Diam: 2.20 cm Dani Gobble Croitoru MD Electronically signed by Sanda Klein MD Signature Date/Time: 04/21/2020/2:59:10 PM    Final     Anti-infectives: Anti-infectives (From admission, onward)   Start     Dose/Rate Route Frequency Ordered Stop   04/16/2020 1200  piperacillin-tazobactam (ZOSYN) IVPB 2.25 g        2.25 g 100 mL/hr over 30 Minutes Intravenous Every 8 hours 04/19/2020 1138         Assessment/Plan Acute blood loss anemia Chronic kidney disease Hyperlipidemia Aortic atherosclerosis Hypokalemia Probable moderate protein calorie malnutrition - prealbumin 7.2 (12/26)   Sigmoid diverticulitis with small fluid collection Partial Large bowel obstruction secondary to sigmoid stricture - Repeat CT 12/25 was essentially stable - tiny fluid collection; not increased in size.  - PO intake remains poor and leukocytosis increased from 11 to 14 today - abdominal film this AM - concerned that this may end up requiring operative intervention but will see what abdominal film looks like - if patient requires operative intervention this would most likely be a Hartmann's  procedure  See consult note regarding her colonoscopy history (attempted colonoscopy in 2007 aborted due to narrowing in sigmoid, BE shortly thereafter also showed narrowing, from what I can tell had no addl f/u regarding that area of narrowing but pt does not give history of thin stools, bloating, etc)    Ideally needs evaluation of colon (colonscopy, virtual colonoscopy) - rec outpt GI follow up in about 6 weeks.   FEN: IVF, CLD VTE: SCDs ID: renal dosed Zosyn 12/21>>  LOS: 8 days    Norm Parcel , [facility] 04/19/2020, 8:41 AM Please see Amion for pager number during day hours 7:00am-4:30pm

## 2020-04-22 NOTE — Anesthesia Preprocedure Evaluation (Addendum)
Anesthesia Evaluation  Patient identified by MRN, date of birth, ID band Patient awake    Reviewed: Allergy & Precautions, NPO status , Patient's Chart, lab work & pertinent test results, reviewed documented beta blocker date and time   History of Anesthesia Complications Negative for: history of anesthetic complications  Airway Mallampati: II  TM Distance: >3 FB Neck ROM: Full    Dental  (+) Missing,    Pulmonary former smoker,    Pulmonary exam normal        Cardiovascular hypertension, Pt. on medications and Pt. on home beta blockers Normal cardiovascular exam     Neuro/Psych CVA negative psych ROS   GI/Hepatic Neg liver ROS, GERD  ,GI bleed   Endo/Other  negative endocrine ROS  Renal/GU Renal InsufficiencyRenal disease (Cr 2.5)  negative genitourinary   Musculoskeletal negative musculoskeletal ROS (+)   Abdominal   Peds  Hematology  (+) anemia , Hgb 7.7, plt 514   Anesthesia Other Findings Day of surgery medications reviewed with patient.  Reproductive/Obstetrics negative OB ROS                            Anesthesia Physical Anesthesia Plan  ASA: III and emergent  Anesthesia Plan: General   Post-op Pain Management:    Induction: Intravenous  PONV Risk Score and Plan: 3 and Treatment may vary due to age or medical condition, Ondansetron and Dexamethasone  Airway Management Planned: Oral ETT  Additional Equipment: None  Intra-op Plan:   Post-operative Plan: Extubation in OR  Informed Consent: I have reviewed the patients History and Physical, chart, labs and discussed the procedure including the risks, benefits and alternatives for the proposed anesthesia with the patient or authorized representative who has indicated his/her understanding and acceptance.     Dental advisory given  Plan Discussed with: CRNA  Anesthesia Plan Comments:       Anesthesia Quick  Evaluation

## 2020-04-22 NOTE — Progress Notes (Signed)
CRITICAL VALUE ALERT  Critical Value:  Troponin 1.06  Date & Time Notied:  04/05/2020 @ 5075  Provider Notified: M. Sharlet Salina, NP  Orders Received/Actions taken: awaiting

## 2020-04-22 NOTE — Progress Notes (Signed)
CRITICAL VALUE ALERT  Critical Value: Results for Martha Ortega, Martha Ortega (MRN [id number]) as of 04/07/2020 23:09  Ref. Range 03/31/2020 21:48 04/13/2020 22:01 03/30/2020 22:19 04/17/2020 22:34 04/23/2020 22:34  Lactic Acid, Venous Latest Ref Range: 0.5 - 1.9 mmol/L    2.5 [facility])     Date & Time Notied:  04/02/2020 2310  Provider Notified: M. Sharlet Salina, NP hospital provider on call   Orders Received/Actions taken: awaiting

## 2020-04-22 NOTE — Transfer of Care (Signed)
Immediate Anesthesia Transfer of Care Note  Patient: Martha Ortega  Procedure(s) Performed: TOTAL ABDOMINAL COLECTOMY WITH ILEOSTOMY REPAIR OF STOMACH INJURY (N/A Abdomen)  Patient Location: PACU  Anesthesia Type:General  Level of Consciousness: awake, alert  and oriented  Airway & Oxygen Therapy: Patient Spontanous Breathing and Patient connected to face mask  Post-op Assessment: Report given to RN and Post -op Vital signs reviewed and stable  Post vital signs: Reviewed and stable  Last Vitals:  Vitals Value Taken Time  BP 150/116 04/09/2020 1604  Temp    Pulse 100 04/13/2020 1607  Resp 32 04/09/2020 1609  SpO2 90 % 03/27/2020 1607  Vitals shown include unvalidated device data.  Last Pain:  Vitals:   04/20/2020 1036  TempSrc:   PainSc: 5       Patients Stated Pain Goal: 2 (30/07/62 2633)  Complications: No complications documented.

## 2020-04-22 NOTE — Op Note (Signed)
Preoperative diagnosis: Diverticulitis of colon with stricture, possible left ureteral injury  Postoperative diagnosis: Same, entrapment of left ureter, no significant ureteral injury  Principal procedure: Left ureteral exploration, left ureterolysis  Surgeon: Serita Degroote  First Assistant: Kinsinger  Anesthesia: General endotracheal  Estimated blood loss: Per Dr. Kieth Brightly  Specimen: None  Drains: None  Indications: 85 year old female undergoing abdominal colectomy with ileostomy for significant diverticulitis and stricture.  During the surgical procedure, there was significant inflammation and mass-effect around the left ureter.  Ureteral injury was suspected during the general surgical procedure/dissection of the colon in the pelvis.  Urologic consultation is requested.  Findings: Proximal and mid ureter were normal, without significant hydronephrosis.  There was adequate peristalsis.  In the mid/distal ureteral area, there was a stump of tissue left from the dissection abutting the anterior/medial aspect of the ureter.  There was significant fixation of the ureter to this area.  However, there was no transection of the ureter on careful dissection.  There was no significant dissection distal to this area from the general surgical procedure, and further dissection of the ureter distally was not warranted.  Description of procedure: I was called during the general surgical procedure to the operating room, as there was concern about injury to the left ureter.  Dr. Kieth Brightly and Dema Severin had dissected proximally and identified the ureter.  I then dissected the ureter free from the stump of the fallopian tube/pelvic tissue that was not necessarily identified as any specific structure.  The ureter was closely affixed to this, and was gently dissected from this.  It was evident during this dissection that there was no significant transection of the ureter, and it was not skeletonized.  There was  adequate serosa along most of the ureter.  It was totally freed from the surrounding tissue such that the ureter was totally identified along the course of the questionable injury.  The dissection was carried out for approximately 3 to 4 cm.  It was evident that the ureter was normal distal to this area of dissection, and it was not followed further distally as this was unnecessary in my opinion.  At this point I did not feel that stenting was necessary from either performing a ureterotomy or from a retrograde fashion.  My portion of the procedure was then completed.  Dr. Kieth Brightly then proceeded with ileostomy formation and abdominal closure.

## 2020-04-22 NOTE — Op Note (Signed)
Preoperative diagnosis: sigmoid stricture  Postoperative diagnosis: sigmoid stricture, dilated proximal colon with contained perforation  Procedure: total abdominal colectomy with end ileostomy, repair of stomach injury  Surgeon: Gurney Maxin, M.D.  Asst: Annye English  Intraoperative consult: Dahlstedt  Anesthesia: general  Indications for procedure: ARLYCE CIRCLE is a 85 y.o. year old female with symptoms of obstipation, worsening renal failure, nausea and vomiting after failing medical therapy. On imaging her cecum was distended to 10 cm so decision was made to proceed to surgery for resection of the stricture.Marland Kitchen  Description of procedure: The patient was brought into the operative suite. Anesthesia was administered with General endotracheal anesthesia. WHO checklist was applied. The patient was then placed in supine position. The area was prepped and draped in the usual sterile fashion.  Next, a left subcostal incision was made. A 82mm trocar was used to gain access to the peritoneal cavity by optical entry technique. Pneumoperitoneum was applied with a high flow and low pressure. The laparoscope was reinserted to confirm position. There were adhesions of the omentum to the abdominal wall. There was some fibrinous tissue in the left gutter as well as right gutter. Marcaine was placed along the right side in a TAP fashion.  3 trocars were placed one in the right upper abdomen, one in the right lower abdomen, one in the infraumbilical space. Harmonic scalpel was used to free the omentum from the abdominal wall and off the sigmoid colon. The colon was fused the abdominal wall and decision was made to open as laparoscopic techniques were not enough to deal with the level of inflammation.  A lower midline incision was made. Cautery was used to divide the subcutaneous tissues and the fascia was opened. A Balfour retractor was placed. Blunt dissection was used to free the distal left colon from  the abdominal wall. A plane was found that was used to further bluntly dissect the sigmoid colon free from the side wall. There was a contained perforation that was entered along this dissection. There was a small abscess with purulence and feculence entered during this dissection. This allowed the sigmoid colon to be completely freed. The rectosigmoid junction was identified and a window was created through the mesentery. A green load contour stapler was used to divide the rectosigmoid junction.  A ligasure was used to divide the mesentery of the sigmoid colon staying close to the colon wall. This was continued to the descending colon. Blunt dissection and cautery was used to free the left colon and spleen from the area. A place was chosen for colostomy in the left upper quadrant. A circular incision was made and the fascia was divided.and the sigmoid colon was attempted to be brought through the incision. However, we could not bring enough colon to healthy colon, the sigmoid was brought back into the abdomen. The contained perforation area was used to decompress the proximal colon. The transverse colon was purple in colon and dilated. The cecum was very dilated on manipulation to free it from an omental attachment the cecum tore. The feces were removed with suction. The tear was closed with 2-0 silk figure of 8. In looking at the area it appeared to be a contained perforation site. The cecum was very dilated and thin walled. Due to the diseased appearance and the perforation, decision was made to remove the colon in its entirety and proceed with end ileostomy. The right colon was freed from the side wall. Ligasure was used to divide the mesentery vessels. As  division went to the transverse colon the stomach was injured with the ligasure. The colon was removed. The injured stomatch was inspected. It was the inferior edge at the proximal antrum with a 3 mm full thickness defect. The injured area was removed with  75 mm blue load GIA stapler.  The abdomen was irrigated warm saline. The rectal stump was inspected and intact. Next the iliac artery was inspected and left ureter identified proximally. It appeared to go near an area that was divided by ligasure. Urology was called for intraoperative consult. As dissection it was clear the ureter was intact, please see Dr. Alan Ripper note for further details.  A right sided upper abdominal circular incision with division through the fascia of the rectus. The end of ileum brought through the incision and clamped in place. The left upper quadrant incision was closed with running 0 PDS on the fascia. A 19 fr blake drain was placed via the LLQ port with tip in the pelvis. The fascia was closed with 0 PDS in running fashion. The skin was left open and packed with saline gauze.  The ileostomy was matured with 3-0 vicryl with Brooking on 3 sides. Additional 3-0 vicryl sutures were placed to appose the ileum edge to the skin. An ostomy appliance was placed.  Findings: stricture of sigmoid colon with intramural abscess, dilated transverse colon and right colon, purulent and feculent peritonitis  Specimen: colon  Implant: 19 fr blake drain in LLQ   Blood loss: 150 ml  Local anesthesia: 20 ml marcaine   Complications: injury to stomach  Gurney Maxin, M.D. General, Bariatric, & Minimally Invasive Surgery [facility]

## 2020-04-22 NOTE — Anesthesia Procedure Notes (Signed)
Procedure Name: Intubation Performed by: Rosaland Lao, CRNA Pre-anesthesia Checklist: Patient identified, Emergency Drugs available, Suction available and Patient being monitored Patient Re-evaluated:Patient Re-evaluated prior to induction Oxygen Delivery Method: Circle system utilized Preoxygenation: Pre-oxygenation with 100% oxygen Induction Type: IV induction Ventilation: Mask ventilation without difficulty Laryngoscope Size: Miller and 2 Grade View: Grade II Tube type: Oral Tube size: 7.0 mm Number of attempts: 1 Airway Equipment and Method: Stylet and Oral airway Placement Confirmation: ETT inserted through vocal cords under direct vision,  positive ETCO2 and breath sounds checked- equal and bilateral Secured at: 22 cm Tube secured with: Tape Dental Injury: Teeth and Oropharynx as per pre-operative assessment

## 2020-04-22 NOTE — Progress Notes (Signed)
PT Cancellation Note  Patient Details Name: Martha Ortega MRN: [id number] DOB: Mar 20, 1935   Cancelled Treatment:      undergoing abdominal colectomy with ileostomy for significant diverticulitis and stricture   Will attempt to see another day   Nathanial Rancher 03/25/2020, 3:21 PM

## 2020-04-23 ENCOUNTER — Inpatient Hospital Stay (HOSPITAL_COMMUNITY): Payer: Medicare Other

## 2020-04-23 ENCOUNTER — Encounter (HOSPITAL_COMMUNITY): Payer: Self-pay | Admitting: General Surgery

## 2020-04-23 DIAGNOSIS — N179 Acute kidney failure, unspecified: Secondary | ICD-10-CM

## 2020-04-23 DIAGNOSIS — T8119XA Other postprocedural shock, initial encounter: Secondary | ICD-10-CM | POA: Diagnosis not present

## 2020-04-23 DIAGNOSIS — A419 Sepsis, unspecified organism: Secondary | ICD-10-CM | POA: Diagnosis not present

## 2020-04-23 DIAGNOSIS — N184 Chronic kidney disease, stage 4 (severe): Secondary | ICD-10-CM | POA: Diagnosis not present

## 2020-04-23 DIAGNOSIS — K668 Other specified disorders of peritoneum: Secondary | ICD-10-CM

## 2020-04-23 DIAGNOSIS — R000 Tachycardia, unspecified: Secondary | ICD-10-CM

## 2020-04-23 DIAGNOSIS — R6521 Severe sepsis with septic shock: Secondary | ICD-10-CM

## 2020-04-23 DIAGNOSIS — T8119XD Other postprocedural shock, subsequent encounter: Secondary | ICD-10-CM

## 2020-04-23 DIAGNOSIS — R778 Other specified abnormalities of plasma proteins: Secondary | ICD-10-CM

## 2020-04-23 DIAGNOSIS — S3710XA Unspecified injury of ureter, initial encounter: Secondary | ICD-10-CM

## 2020-04-23 DIAGNOSIS — D62 Acute posthemorrhagic anemia: Secondary | ICD-10-CM | POA: Diagnosis not present

## 2020-04-23 DIAGNOSIS — K56609 Unspecified intestinal obstruction, unspecified as to partial versus complete obstruction: Secondary | ICD-10-CM | POA: Diagnosis not present

## 2020-04-23 DIAGNOSIS — S3710XD Unspecified injury of ureter, subsequent encounter: Secondary | ICD-10-CM

## 2020-04-23 DIAGNOSIS — K5721 Diverticulitis of large intestine with perforation and abscess with bleeding: Secondary | ICD-10-CM | POA: Diagnosis not present

## 2020-04-23 LAB — CBC WITH DIFFERENTIAL/PLATELET
Abs Immature Granulocytes: 0.13 10*3/uL — ABNORMAL HIGH (ref 0.00–0.07)
Basophils Absolute: 0.1 10*3/uL (ref 0.0–0.1)
Basophils Relative: 1 %
Eosinophils Absolute: 0 10*3/uL (ref 0.0–0.5)
Eosinophils Relative: 0 %
HCT: 28.5 % — ABNORMAL LOW (ref 36.0–46.0)
Hemoglobin: 9.6 g/dL — ABNORMAL LOW (ref 12.0–15.0)
Immature Granulocytes: 1 %
Lymphocytes Relative: 6 %
Lymphs Abs: 0.7 10*3/uL (ref 0.7–4.0)
MCH: 29 pg (ref 26.0–34.0)
MCHC: 33.7 g/dL (ref 30.0–36.0)
MCV: 86.1 fL (ref 80.0–100.0)
Monocytes Absolute: 0.2 10*3/uL (ref 0.1–1.0)
Monocytes Relative: 2 %
Neutro Abs: 11.2 10*3/uL — ABNORMAL HIGH (ref 1.7–7.7)
Neutrophils Relative %: 90 %
Platelets: 399 10*3/uL (ref 150–400)
RBC: 3.31 MIL/uL — ABNORMAL LOW (ref 3.87–5.11)
RDW: 17.1 % — ABNORMAL HIGH (ref 11.5–15.5)
WBC: 12.3 10*3/uL — ABNORMAL HIGH (ref 4.0–10.5)
nRBC: 0 % (ref 0.0–0.2)

## 2020-04-23 LAB — BPAM RBC
Blood Product Expiration Date: 202201302359
Blood Product Expiration Date: 202201302359
ISSUE DATE / TIME: 202112291257
ISSUE DATE / TIME: 202112291257
Unit Type and Rh: 5100
Unit Type and Rh: 5100

## 2020-04-23 LAB — TYPE AND SCREEN
ABO/RH(D): O POS
Antibody Screen: NEGATIVE
Unit division: 0
Unit division: 0

## 2020-04-23 LAB — RENAL FUNCTION PANEL
Albumin: 2.4 g/dL — ABNORMAL LOW (ref 3.5–5.0)
Anion gap: 12 (ref 5–15)
BUN: 25 mg/dL — ABNORMAL HIGH (ref 8–23)
CO2: 17 mmol/L — ABNORMAL LOW (ref 22–32)
Calcium: 7.5 mg/dL — ABNORMAL LOW (ref 8.9–10.3)
Chloride: 106 mmol/L (ref 98–111)
Creatinine, Ser: 2.54 mg/dL — ABNORMAL HIGH (ref 0.44–1.00)
GFR, Estimated: 18 mL/min — ABNORMAL LOW (ref >60)
Glucose, Bld: 95 mg/dL (ref 70–99)
Phosphorus: 3.9 mg/dL (ref 2.5–4.6)
Potassium: 4.1 mmol/L (ref 3.5–5.1)
Sodium: 135 mmol/L (ref 135–145)

## 2020-04-23 LAB — IRON AND TIBC: Iron: 41 ug/dL (ref 28–170)

## 2020-04-23 LAB — HEMOGLOBIN A1C
Hgb A1c MFr Bld: 5.2 % (ref 4.8–5.6)
Mean Plasma Glucose: 102.54 mg/dL

## 2020-04-23 LAB — LACTIC ACID, PLASMA: Lactic Acid, Venous: 1.8 mmol/L (ref 0.5–1.9)

## 2020-04-23 LAB — MAGNESIUM: Magnesium: 1.6 mg/dL — ABNORMAL LOW (ref 1.7–2.4)

## 2020-04-23 LAB — PROCALCITONIN: Procalcitonin: 38.94 ng/mL

## 2020-04-23 LAB — FERRITIN: Ferritin: 605 ng/mL — ABNORMAL HIGH (ref 11–307)

## 2020-04-23 LAB — TROPONIN I (HIGH SENSITIVITY)
Troponin I (High Sensitivity): 128 ng/L (ref <18)
Troponin I (High Sensitivity): 139 ng/L (ref <18)

## 2020-04-23 LAB — VITAMIN B12: Vitamin B-12: 522 pg/mL (ref 180–914)

## 2020-04-23 LAB — CREATININE, URINE, RANDOM: Creatinine, Urine: 122.11 mg/dL

## 2020-04-23 LAB — SODIUM, URINE, RANDOM: Sodium, Ur: <10 mmol/L

## 2020-04-23 MED ORDER — SODIUM CHLORIDE 0.9 % IV SOLN: INTRAVENOUS | Status: DC

## 2020-04-23 MED ORDER — STERILE WATER FOR INJECTION IV SOLN
INTRAVENOUS | Status: DC
  Filled 2020-04-23 (×2): qty 150

## 2020-04-23 MED ORDER — ALBUMIN HUMAN 25 % IV SOLN
25.0000 g | Freq: Once | INTRAVENOUS | Status: AC
  Administered 2020-04-23: 01:00:00 25 g via INTRAVENOUS
  Filled 2020-04-23: qty 100

## 2020-04-23 MED ORDER — LACTATED RINGERS IV BOLUS: 500.0000 mL | Freq: Once | INTRAVENOUS | Status: DC

## 2020-04-23 MED ORDER — FAMOTIDINE IN NACL 20-0.9 MG/50ML-% IV SOLN
20.0000 mg | Freq: Every day | INTRAVENOUS | Status: DC
  Administered 2020-04-23 – 2020-04-26 (×4): 20 mg via INTRAVENOUS
  Filled 2020-04-23 (×5): qty 50

## 2020-04-23 MED ORDER — NOREPINEPHRINE 4 MG/250ML-% IV SOLN
2.0000 ug/min | INTRAVENOUS | Status: DC
  Administered 2020-04-23: 20:00:00 7 ug/min via INTRAVENOUS
  Administered 2020-04-23: 01:00:00 2 ug/min via INTRAVENOUS
  Administered 2020-04-23: 16:00:00 8 ug/min via INTRAVENOUS
  Administered 2020-04-24: 2 ug/min via INTRAVENOUS
  Filled 2020-04-23 (×4): qty 250

## 2020-04-23 MED ORDER — MAGNESIUM SULFATE 4 GM/100ML IV SOLN
4.0000 g | Freq: Once | INTRAVENOUS | Status: AC
  Administered 2020-04-23: 08:00:00 4 g via INTRAVENOUS
  Filled 2020-04-23: qty 100

## 2020-04-23 MED ORDER — SODIUM CHLORIDE 0.9 % IV SOLN
250.0000 mL | INTRAVENOUS | Status: DC
  Administered 2020-04-23 – 2020-04-29 (×4): 250 mL via INTRAVENOUS

## 2020-04-23 MED ORDER — LACTATED RINGERS IV BOLUS
1000.0000 mL | Freq: Once | INTRAVENOUS | Status: AC
  Administered 2020-04-23: 01:00:00 1000 mL via INTRAVENOUS

## 2020-04-23 MED ORDER — METOPROLOL TARTRATE 5 MG/5ML IV SOLN
2.5000 mg | Freq: Three times a day (TID) | INTRAVENOUS | Status: DC
  Administered 2020-04-23: 11:00:00 2.5 mg via INTRAVENOUS
  Filled 2020-04-23: qty 5

## 2020-04-23 MED ORDER — NOREPINEPHRINE 4 MG/250ML-% IV SOLN: 0.0000 ug/min | INTRAVENOUS | Status: DC

## 2020-04-23 NOTE — Progress Notes (Addendum)
Subjective No acute events. Noted she got scanned overnight for pneumoperitoneum on cxr which in setting of surgery 9 hrs prior is completely expected. Awake, alert and reports she is overall feeling reasonably well aside from being tired. Son at bedside. We reviewed procedure, findings, surgery and plan moving forward. They expressed understanding. She denies n/v with NG in place. She reports good pain control.  Objective: Vital signs in last 24 hours: Temp:  [96.1 F (35.6 C)-98.3 F (36.8 C)] 98 F (36.7 C) (12/30 0330) Pulse Rate:  [61-137] 133 (12/30 0645) Resp:  [11-38] 23 (12/30 0645) BP: (75-151)/(15-124) 94/55 (12/30 0645) SpO2:  [87 %-100 %] 93 % (12/30 0645) Last BM Date: 04/17/20  Intake/Output from previous day: 12/29 0701 - 12/30 0700 In: 6585.9 [I.V.:3205.3; Blood:627; IV Piggyback:2753.6] Out: 1325 [Urine:245; Emesis/NG output:550; Drains:180; Blood:150] Intake/Output this shift: No intake/output data recorded.  Gen: NAD, comfortable CV: tachycardic rate, regular rhythm Pulm: Normal work of breathing Abd: Soft, appropriately ttp; nondistended; JP serosang; NG bilious Ext: SCDs in place  Lab Results: CBC  Recent Labs    03/30/2020 2234 04/23/20 0251  WBC 8.4 12.3*  HGB 9.5* 9.6*  HCT 28.5* 28.5*  PLT 411* 399   BMET Recent Labs    04/16/2020 2234 04/23/20 0251  NA 137 135  K 4.1 4.1  CL 109 106  CO2 17* 17*  GLUCOSE 99 95  BUN 24* 25*  CREATININE 2.53* 2.54*  CALCIUM 7.4* 7.5*   PT/INR No results for input(s): LABPROT, INR in the last 72 hours. ABG No results for input(s): PHART, HCO3 in the last 72 hours.  Invalid input(s): PCO2, PO2  Studies/Results:  Anti-infectives: Anti-infectives (From admission, onward)   Start     Dose/Rate Route Frequency Ordered Stop   04/09/2020 1200  piperacillin-tazobactam (ZOSYN) IVPB 2.25 g        2.25 g 100 mL/hr over 30 Minutes Intravenous Every 8 hours 03/25/2020 1138         Assessment/Plan: Patient  Active Problem List   Diagnosis Date Noted  . Severe sepsis with septic shock ([place]) 04/23/2020  . Postoperative hypovolemic shock 04/23/2020  . Pneumoperitoneum 04/23/2020  . Renal failure (ARF), acute on chronic ([place]) 04/23/2020  . Elevated troponin 04/23/2020  . Large bowel obstruction ([place])   . Cerebrovascular accident (CVA) ([place])   . Hypomagnesemia   . Hypokalemia   . Sepsis ([place])   . Pericolonic abscess due to diverticulitis 04/21/2020  . Diverticulitis of sigmoid colon with abscess 03/29/2020  . Aortic atherosclerosis ([place]) 04/03/2020  . ABLA (acute blood loss anemia) 04/08/2020  . Blood in stool   . Abdominal pain 04/06/2020  . CKD (chronic kidney disease), stage IV ([place]) 04/06/2020  . Mild protein-calorie malnutrition ([place]) 04/06/2020  . Osteopenia 07/26/2015  . Estrogen deficiency 06/23/2015  . History of CVA (cerebrovascular accident) 03/02/2015  . Encounter for Medicare annual wellness exam 09/23/2013  . Spinal stenosis of lumbar region 09/23/2013  . GERD (gastroesophageal reflux disease) 12/12/2011  . Renal insufficiency 11/26/2008  . ALLERGIC RHINITIS 09/10/2007  . Hyperlipidemia 09/08/2006  . Essential hypertension 09/08/2006  . POSTNASAL DRIP SYNDROME 09/08/2006   s/p Procedure(s): TOTAL ABDOMINAL COLECTOMY WITH END ILEOSTOMY; GASTRIC REPAIR 04/10/2020  -Renal US showed no hydronephrosis -Oliguria, Cr 2.54 - would considering nephrology consultation as volume status will be tricky particularly if confounded by cardiac event; currently 14L net positive since admission -Cont JP drain to bulb suction -Troponins up-trending; medicine following; potentially having cardiology weigh in -NPO, MIVF, NGT to  low intermittent suction -Would recommend starting TPN - anticipate she will have protracted ileus and additionally has had many days with very poor appetite -PPx: SCDs; ok for chemical dvt ppx; if cardiology feels strongly about heparin drip, would monitor hemoglobin  closely and do NOT bolus heparin  -I was able to update her and her son at bedside this morning, answer their questions and review her care thus far   LOS: 9 days   Sharon Mt. Dema Severin, M.D. [facility] Use [email or web address] to contact on call provider

## 2020-04-23 NOTE — Consult Note (Signed)
NAME:  Martha Ortega, MRN:  [id number], DOB:  01-24-1935, LOS: 9 ADMISSION DATE:  04/15/2020, CONSULTATION DATE:  04/17/2020 REFERRING MD:  Eugenie Filler, MD CHIEF COMPLAINT:  Belly pain   Brief History:  85 year old with chronic diverticulitis with bowel obstruction from sigmoid stricture that failed medical management s/p ex lap, total colectomy, end ileostomy 12/29 who is hypotensive post-op.   History of Present Illness:  Admitted 12/21 with LLQ pain and bloody stool. CT abdomen/pelivs on admission with diverticulitis of the distal descending colon complicated by a sinus tract and 18 mm collection in the low pericolic gutter. Managed conservatively with NG tube. Liquid diet, IVF. Repeat CT AP 12/25 appeared mildly worse with wall thickening, no frank perforation. Was not taking PO so taken to OR 12/29 for surgery. Now hypotensive despite IVF boluses.   Past Medical History:  HTN Diverticulitis  Significant Hospital Events:  Ex lap 12/29  Consults:  Surgery PCCM  Procedures:  Ex lap, total colectomy and end ileostomy 12/29  Significant Diagnostic Tests:  CT abdomen 12/29  Micro Data:  12/2p UCx, BcX x 2 pending  Antimicrobials:  zosyn  Interim History / Subjective:  n/a  Objective   Blood pressure (!) 82/46, pulse (!) 124, temperature (!) 97.5 F (36.4 C), temperature source Oral, resp. rate (!) 29, height 5\' 2"  (1.575 m), weight 59.9 kg, SpO2 97 %.        Intake/Output Summary (Last 24 hours) at 04/23/2020 0133 Last data filed at 04/23/2020 0026 Gross per 24 hour  Intake 6441.47 ml  Output 525 ml  Net 5916.47 ml   Filed Weights   04/08/2020 1333  Weight: 59.9 kg    Examination: General: frail, in pain Eyes: EOMI, no icterus Mouth: dry mucous membranes Lungs: CTAB, no icterus Cardiovascular: tachy, no murmurs Abdomen: TTP, ostomy present, incision CDI Extremities: warm, no edema Neuro: alert, responds appropriately, in pain  Resolved Hospital  Problem list   n/a  Assessment & Plan:  Septic and hypovolemic shock: Minimal UOP and elevated lactic acid. Tachy, leukocytosis. Dry on exam in setting of bowel obstruction, poor to no PO intake. Source of sepsis intraabdominal s/p surgery. Hgb stable. Bps low despite IVF boluses.  --addl 1L LR +  50 mg 25% albumin --NE, MAP > 65  Pneumoperitoneum: Suspect post-operative change. Possible source of hypotension. --TRH to alert surgery team  AKI, oliguria: 100 CC UOP over 5 hours. Related to shock as above --IVF --Avoid nephrotoxic agents  Bowel obstruction due to sigmoid stricture with contained perforation s/p ex-lap and total colectomy and end ileostomy: --NG to suction --NPO --Appreciate surgery assitance  Best practice (evaluated daily)  Per primary  Goals of Care:  Per primary  Labs   CBC: Recent Labs  Lab 04/19/20 0505 04/20/20 0417 04/21/20 0549 04/08/2020 0442 03/27/2020 2234  WBC 12.9* 12.3* 11.6* 14.2* 8.4  NEUTROABS  --  8.9* 9.4* 12.7* 7.4  HGB 7.4* 7.4* 7.2* 7.7* 9.5*  HCT 22.9* 23.1* 22.0* 23.6* 28.5*  MCV 83.9 84.9 83.7 81.9 87.4  PLT 434* 472* 430* 514* 411*    Basic Metabolic Panel: Recent Labs  Lab 04/19/20 0505 04/20/20 0417 04/21/20 0549 03/26/2020 0442 04/15/2020 2234  NA 140 139 139 133* 137  K 4.2 3.9 4.4 4.3 4.1  CL 110 109 108 104 109  CO2 20* 19* 19* 18* 17*  GLUCOSE 109* 104* 102* 142* 99  BUN 13 15 19  25* 24*  CREATININE 2.04* 1.96* 2.03* 2.50* 2.53*  CALCIUM 8.4* 8.2* 8.3* 8.1* 7.4*  MG 1.7 1.8  --   --   --    GFR: Estimated Creatinine Clearance: 13.3 mL/min (A) (by C-G formula based on SCr of 2.53 mg/dL (H)). Recent Labs  Lab 04/18/20 0509 04/19/20 0505 04/20/20 0417 04/21/20 0549 04/07/2020 0442 04/16/2020 2234 04/23/20 0023  PROCALCITON  --   --   --   --   --  33.28  --   WBC 10.2   < > 12.3* 11.6* 14.2* 8.4  --   LATICACIDVEN 1.2  --   --   --   --  2.5* 1.8   < > = values in this interval not displayed.    Liver  Function Tests: Recent Labs  Lab 03/28/2020 2234  AST 33  ALT 14  ALKPHOS 30*  BILITOT 0.9  PROT 4.0*  ALBUMIN 1.8*   No results for input(s): LIPASE, AMYLASE in the last 168 hours. No results for input(s): AMMONIA in the last 168 hours.  ABG No results found for: PHART, PCO2ART, PO2ART, HCO3, TCO2, ACIDBASEDEF, O2SAT   Coagulation Profile: Recent Labs  Lab 04/16/20 0531  INR 1.5*    Cardiac Enzymes: No results for input(s): CKTOTAL, CKMB, CKMBINDEX, TROPONINI in the last 168 hours.  HbA1C: Hgb A1c MFr Bld  Date/Time Value Ref Range Status  11/25/2010 09:33 AM 5.5 4.6 - 6.5 % Final    Comment:    Glycemic Control Guidelines for People with Diabetes:Non Diabetic:  <6%Goal of Therapy: <7%Additional Action Suggested:  >8%     CBG: No results for input(s): GLUCAP in the last 168 hours.  Review of Systems:   Unable to obtain based on patient factors   Past Medical History:  Martha Ortega,  has a past medical history of Abnormal blood findings, Allergic rhinitis, History of diverticulitis of colon, Hyperglycemia (09), Hyperlipidemia, Hypertension, and Stroke (cerebrum) ([place]).   Surgical History:   Past Surgical History:  Procedure Laterality Date  . ABDOMINAL HYSTERECTOMY    . APPENDECTOMY     Patient states not sure if this is true  . barium swallow  10/18/03  . CARDIOVASCULAR STRESS TEST  07/21/04  . CYSTECTOMY  93   R axilla  . CYSTECTOMY  97   R breast     Social History:   reports that Martha Ortega quit smoking about 42 years ago. Martha Ortega has never used smokeless tobacco. Martha Ortega reports that Martha Ortega does not drink alcohol and does not use drugs.   Family History:  Her family history includes Arthritis in her mother and sister; Diabetes in her sister; Hypertension in her father; Lupus in her sister; Prostate cancer in her brother; Stroke in her father.   Allergies Allergies  Allergen Reactions  . Amlodipine Swelling    Throat and mouth swelling  . Fluticasone Propionate      REACTION: does not help  . Loratadine     REACTION: does not work  . Lovastatin     REACTION: was not effective  . Omeprazole Swelling    Throat, lips, and mouth swelling  . Zocor [Simvastatin - High Dose]     Mouth and throat swelled     Home Medications  Prior to Admission medications   Medication Sig Start Date End Date Taking? Authorizing Provider  aspirin 81 MG tablet Take 81 mg by mouth daily.   Yes [provider]  Calcium Carbonate-Vit D-Min (CALCIUM 1200 PO) Take 1 tablet by mouth daily.   Yes [provider]  hydrALAZINE (  APRESOLINE) 50 MG tablet Take 50 mg by mouth in the morning and at bedtime. 11/08/19  Yes [provider]  losartan (COZAAR) 50 MG tablet Take 50 mg by mouth daily. 02/11/20  Yes [provider]  metoprolol succinate (TOPROL-XL) 50 MG 24 hr tablet TAKE 1 TABLET DAILY WITH OR IMMEDIATELY FOLLOWING A MEAL 10/18/19  Yes Tower, Marne A, MD  rosuvastatin (CRESTOR) 20 MG tablet TAKE 1 TABLET DAILY 12/05/19  Yes Tower, Wynelle Fanny, MD  amoxicillin-clavulanate (AUGMENTIN) 875-125 MG tablet Take 1 tablet by mouth 2 (two) times daily. Patient not taking: No sig reported 04/06/20   Venia Carbon, MD     Critical care time:     CRITICAL CARE Performed by: Lanier Clam   Total critical care time: 35 minutes  Critical care time was exclusive of separately billable procedures and treating other patients.  Critical care was necessary to treat or prevent imminent or life-threatening deterioration.  Critical care was time spent personally by me on the following activities: development of treatment plan with patient and/or surrogate as well as nursing, discussions with consultants, evaluation of patient's response to treatment, examination of patient, obtaining history from patient or surrogate, ordering and performing treatments and interventions, ordering and review of laboratory studies, ordering and review of radiographic  studies, pulse oximetry and re-evaluation of patient's condition.

## 2020-04-23 NOTE — Progress Notes (Addendum)
Pt with persistent hypotension despite 2 liter bolus.  CXR showed pneumoperitoneum and recommended CT of abdomen and pelvis, which also revealed pneumoperitoneum.  Dr Silas Flood with PCCM was in hospital to evaluate another patient and agreed to see Martha Ortega.  He started her on Levophed with improvement.    Troponin elevated and Lactic acid elevated initially but WNL on 2nd sample.  Procalcitonin elevated at 33. EKG showed sinus tachycardia.  Urinalysis negative, culture pending.  Blood cultures pending.  Spoke with Dr Kieth Brightly, General surgery to advise of current status, labs, and imaging results.  He saw CT images and stated lab changes likely due to extensive surgery today.  He did not recommend any new orders.

## 2020-04-23 NOTE — Plan of Care (Signed)
Overnight, hypotension treated with fluid boluses, albumin, and pressors. Patient remains in moderate to severe pain due to inability to give opioids every two hours due to hypotension. Patient remains tachycardic and providers are aware, but don't have any other meds to lower that would not further lower blood pressure. Patient has remained alert and oriented overnight. Foley in place from OR with minimal urinary output. NG in left nare with intermittent low wall suction and significant output. JP drain in place with serosanguinous drainage as expected post-op. No other wounds beyond surgical incisions.   Problem: Education: Goal: Required Educational Video(s) Outcome: Progressing   Problem: Clinical Measurements: Goal: Ability to maintain clinical measurements within normal limits will improve Outcome: Progressing Goal: Postoperative complications will be avoided or minimized Outcome: Progressing   Problem: Skin Integrity: Goal: Demonstration of wound healing without infection will improve Outcome: Progressing   Problem: Education: Goal: Ability to identify signs and symptoms of gastrointestinal bleeding will improve Outcome: Progressing   Problem: Bowel/Gastric: Goal: Will show no signs and symptoms of gastrointestinal bleeding Outcome: Progressing   Problem: Fluid Volume: Goal: Will show no signs and symptoms of excessive bleeding Outcome: Progressing   Problem: Clinical Measurements: Goal: Complications related to the disease process, condition or treatment will be avoided or minimized Outcome: Progressing   Problem: Education: Goal: Knowledge of General Education information will improve Description: Including pain rating scale, medication(s)/side effects and non-pharmacologic comfort measures Outcome: Progressing   Problem: Health Behavior/Discharge Planning: Goal: Ability to manage health-related needs will improve Outcome: Progressing   Problem: Clinical  Measurements: Goal: Ability to maintain clinical measurements within normal limits will improve Outcome: Progressing Goal: Will remain free from infection Outcome: Progressing Goal: Diagnostic test results will improve Outcome: Progressing Goal: Respiratory complications will improve Outcome: Progressing Goal: Cardiovascular complication will be avoided Outcome: Progressing   Problem: Activity: Goal: Risk for activity intolerance will decrease Outcome: Progressing   Problem: Nutrition: Goal: Adequate nutrition will be maintained Outcome: Progressing   Problem: Coping: Goal: Level of anxiety will decrease Outcome: Progressing   Problem: Elimination: Goal: Will not experience complications related to bowel motility Outcome: Progressing Goal: Will not experience complications related to urinary retention Outcome: Progressing   Problem: Pain Managment: Goal: General experience of comfort will improve Outcome: Progressing   Problem: Safety: Goal: Ability to remain free from injury will improve Outcome: Progressing   Problem: Skin Integrity: Goal: Risk for impaired skin integrity will decrease Outcome: Progressing

## 2020-04-23 NOTE — Progress Notes (Signed)
CRITICAL VALUE ALERT  Critical Value:  Troponin 128  Date & Time Noted:  04/23/20 5462  Provider Notified: M. Sharlet Salina, NP  Orders Received/Actions taken: awaiting

## 2020-04-23 NOTE — Progress Notes (Addendum)
PROGRESS NOTE    Martha Ortega  ZOX:[id number] DOB: 1935/01/23 DOA: 04/21/2020 PCP: [name], MD    Chief Complaint  Patient presents with  . Blood In Stools  . Abdominal Pain    Brief Narrative:  Patient is 85 year old female with history of nonhemorrhagic CVA, CKD stage IV, hypertension, hyperlipidemia, diverticulitis, status post appendicectomy and hysterectomy who presented to the emergency room with 2 weeks history of intermittent left lower quadrant pain, blood in the stool.  She was started on Augmentin as an outpatient for presumed diverticulitis by her PCP but it did not help.  On presentation, she was tachycardic and tachypneic, had leukocytosis, lactic acidosis.  Imaging showed recurrent descending colon diverticulitis complicated by sinus tract and collection in the lower paracolic catheter.  She was started on IV antibiotics, GI and general surgery consulted.  She had a prolonged hospital course was persistent abdominal pain, no bowel movement.    Assessment & Plan:   Principal Problem:   Severe sepsis with septic shock (HCC) Active Problems:   Postoperative hypovolemic shock   Pneumoperitoneum   Hyperlipidemia   CKD (chronic kidney disease), stage IV (HCC)   Diverticulitis of sigmoid colon with abscess   Aortic atherosclerosis (HCC)   Blood in stool   ABLA (acute blood loss anemia)   Pericolonic abscess due to diverticulitis   Large bowel obstruction (HCC)   Cerebrovascular accident (CVA) ([place])   Hypomagnesemia   Hypokalemia   Sepsis ([place])   Renal failure (ARF), acute on chronic (HCC)   Elevated troponin   Ureter injury  1 severe sepsis with septic shock and hypovolemic shock Patient noted postop to go into septic shock and hypovolemic shock with minimal urine output, elevated lactic acid levels which are trended down.  Patient noted to be tachycardic with a leukocytosis.  Patient with poor oral intake.  Patient pancultured results pending.  Repeat  CT abdomen and pelvis done concerning for pneumoperitoneum which per general surgery is expected.  Patient received 2 to 3 L bolus however still hypotensive and subsequently started on Levophed drip.  Blood pressure improved this morning.  Placed on hydration.  PCCM consulted and patient received 25% albumin.  PCCM following and appreciate input and recommendations.  2.  Pneumoperitoneum Per general surgery suspected postop changes.  Per general surgery.  3. sepsis likely secondary to recurrent diverticulitis/sigmoid stricture, POA Patient met criteria for sepsis with tachycardia, tachypnea, leukocytosis.  Patient pancultured with cultures pending.  Continue Parikh IV antibiotics.  General surgery following.    4.  Recurrent diverticulitis with sigmoid stricture and bowel obstruction Complicated by sinus tract and fluid collections in the lower paracolic gutter.  Repeat CT abdomen and pelvis done April 18, 2020 with sigmoid diverticulosis with marked wall thickening and pericolic inflammatory changes of the proximal sigmoid colon, no evidence of abscess or free intraperitoneal perforation.  Leukocytosis trended down.  Patient noted not to have had a bowel movement in several days despite MiraLAX and suppository.  Patient underwent abdominal films with residual contrast within the colon proximal to the sigmoid reflecting a degree of persistent obstruction, distention of the cecum to 9.7 cm.  Patient seen by general surgery this morning was taken to the OR where she underwent total abdominal colectomy with end ileostomy, repair of stomach injury secondary to sigmoid stricture, dilated proximal colon with contained perforation per Dr. Kieth Brightly (03/28/2020).  Patient on IV Zosyn.  Patient currently in stepdown unit.  Patient noted to have developed shock overnight currently requiring  pressors with elevated procalcitonin levels.  Continue empiric IV Zosyn.  Blood cultures repeated and pending.  Per general  surgery.  5.  Entrapment of left ureter Patient noted to have possible left ureteral injury with entrapment of left ureter.  Urology was consulted during surgery and patient underwent left ureteral exploration, left ureterolysis per Dr. Diona Fanti 04/09/2020.  Per urology.  6.  Hypokalemia/hypomagnesemia Repleted.  Potassium at 4.1.  Magnesium at 1.6.  Magnesium sulfate 4 g IV x1.  Repeat labs in the morning.  7.  Normocytic anemia/suspected GI bleed Hemoglobin noted to be in the range of 7 which has remained stable.  Anemia panel done with low iron patient status post IV iron infusion.  Patient noted to have had hematochezia at home likely associated with acute recurrent diverticulitis.  GI was consulted during this hospitalization and no plans for intervention at this time.  Transfusion threshold hemoglobin <7.  Hemoglobin currently stable at 9.6.  Outpatient follow-up with GI.  8.  Hypertension Stable.  Patient currently n.p.o.  Hydralazine and Cozaar on hold.  Patient noted to develop shock overnight.  Discontinue oral metoprolol and placed on IV metoprolol 2.5 mg every 8 hours as patient seems to have a rebound tachycardia.  Follow.   9.  History of nonhemorrhagic CVA Noted to be on aspirin and statin at home prior to admission.  Aspirin on hold due to concern for possible GI bleed.  Patient currently n.p.o.  DC statin and resume when tolerating oral intake.    10.  Acute on chronic kidney disease stage IV/acidosis Patient with a bump in creatinine up to 2.54 from 2.09 on admission.  Baseline creatinine 1.7-2.  Likely a prerenal azotemia as patient noted to have developed shock postoperatively requiring pressors.  Urinalysis protein negative, nitrite negative, leukocytes negative with a specific gravity of 1.019.  Check a urine sodium.  Patient with a urine output of 245 cc over the past 24 hours.  Patient noted to be +14.462 L during this hospitalization.  Patient also hypotensive requiring  pressors.  Labs this morning with a bicarb of 17, creatinine of 2.54.  IV fluids were resumed this morning however will place on a bicarb drip at 75 cc an hour.  Foley catheter placed.  Consult with nephrology for further evaluation and management.  Cozaar on hold.  Will likely not resume Cozaar on discharge.  Follow.  11.  Elevated troponin Likely demand ischemia postoperatively as patient noted to have developed shock.  Patient denies any acute chest pain.  EKG done with a sinus tachycardia.  2D echo done preoperatively with 60 to 65%, no wall motion abnormalities, grade 1 diastolic dysfunction.  As patient developed shock.  Will consult with cardiology for further evaluation and management.  Start low-dose IV beta-blocker.  Follow.    DVT prophylaxis: SCDs Code Status: Full Family Communication: Updated patient and son at bedside. Disposition:   Status is: Inpatient    Dispo: The patient is from: Home              Anticipated d/c is to: To be determined              Anticipated d/c date is: To be determined to              Patient currently postop, noted to develop hypotension and shock, currently in stepdown unit, on IV pressors.  Not stable for discharge.        Consultants:   Gastroenterology: Dr. Havery Moros 04/13/2020  General surgery: Dr. Redmond Pulling 04/18/2020  PCCM: Dr. Silas Flood 04/23/2020  Procedures:   CT abdomen and pelvis 04/06/2020, 04/18/2020, 04/23/2020  Abdominal films 04/13/2020  2 D ECHO 04/21/2020  Total abd colectomy with end ileostomy, repair of stomach injury --per Dr Kieth Brightly 04/05/2020  Left ureteral exploration and left ureterolysis --Per urology Dr Diona Fanti 04/09/2020    Antimicrobials:   IV Zosyn 04/17/2020>>>>   Subjective: Alert.  NG tube in place.  Denies any shortness of breath.  Denies any chest pain.  Overall states she is feeling better.  Events overnight noted patient noted to develop significant hypotension, placed on pressors,  IV fluids.   Objective: Vitals:   04/23/20 0645 04/23/20 0730 04/23/20 0800 04/23/20 0900  BP: (!) 94/55  (!) 122/54 120/62  Pulse: (!) 133  (!) 137 (!) 138  Resp: (!) 23  (!) 30 (!) 30  Temp:  97.8 F (36.6 C)    TempSrc:  Oral    SpO2: 93%  92% 90%  Weight:      Height:        Intake/Output Summary (Last 24 hours) at 04/23/2020 1020 Last data filed at 04/23/2020 0800 Gross per 24 hour  Intake 6616.52 ml  Output 1325 ml  Net 5291.52 ml   Filed Weights   04/21/2020 1333  Weight: 59.9 kg    Examination:  General exam: In stepdown unit.  Alert.  NG tube in place. Respiratory system: Lungs clear to auscultation bilaterally anterior lung fields.  No wheezes, no crackles, no rhonchi.   Cardiovascular system: Tachycardia.  No JVD, no murmurs rubs or gallops.  Trace bilateral lower extremity edema. Gastrointestinal system: Abdomen is nondistended, soft, JP drain in place, and ileostomy noted with minimal sanguinous fluid, hypoactive bowel sounds.  Central nervous system: Alert.  Moving extremities spontaneously.  No focal neurological deficits.  Extremities: Symmetric 5 x 5 power. Skin: No rashes, lesions or ulcers Psychiatry: Judgement and insight fair.  Mood and affect appropriate.    Data Reviewed: I have personally reviewed following labs and imaging studies  CBC: Recent Labs  Lab 04/20/20 0417 04/21/20 0549 04/13/2020 0442 03/26/2020 2234 04/23/20 0251  WBC 12.3* 11.6* 14.2* 8.4 12.3*  NEUTROABS 8.9* 9.4* 12.7* 7.4 11.2*  HGB 7.4* 7.2* 7.7* 9.5* 9.6*  HCT 23.1* 22.0* 23.6* 28.5* 28.5*  MCV 84.9 83.7 81.9 87.4 86.1  PLT 472* 430* 514* 411* 280    Basic Metabolic Panel: Recent Labs  Lab 04/19/20 0505 04/20/20 0417 04/21/20 0549 04/01/2020 0442 04/03/2020 2234 04/23/20 0251  NA 140 139 139 133* 137 135  K 4.2 3.9 4.4 4.3 4.1 4.1  CL 110 109 108 104 109 106  CO2 20* 19* 19* 18* 17* 17*  GLUCOSE 109* 104* 102* 142* 99 95  BUN _0 25* 24* 25*  CREATININE  2.04* 1.96* 2.03* 2.50* 2.53* 2.54*  CALCIUM 8.4* 8.2* 8.3* 8.1* 7.4* 7.5*  MG 1.7 1.8  --   --   --  1.6*  PHOS  --   --   --   --   --  3.9    GFR: Estimated Creatinine Clearance: 13.3 mL/min (A) (by C-G formula based on SCr of 2.54 mg/dL (H)).  Liver Function Tests: Recent Labs  Lab 04/04/2020 2234 04/23/20 0251  AST 33  --   ALT 14  --   ALKPHOS 30*  --   BILITOT 0.9  --   PROT 4.0*  --   ALBUMIN 1.8* 2.4*    CBG: No results  for input(s): GLUCAP in the last 168 hours.   Recent Results (from the past 240 hour(s))  Resp Panel by RT-PCR (Flu A&B, Covid) Nasopharyngeal Swab     Status: None   Collection Time: 04/24/2020  9:13 AM   Specimen: Nasopharyngeal Swab; Nasopharyngeal(NP) swabs in vial transport medium  Result Value Ref Range Status   SARS Coronavirus 2 by RT PCR NEGATIVE NEGATIVE Final    Comment: (NOTE) SARS-CoV-2 target nucleic acids are NOT DETECTED.  The SARS-CoV-2 RNA is generally detectable in upper respiratory specimens during the acute phase of infection. The lowest concentration of SARS-CoV-2 viral copies this assay can detect is 138 copies/mL. A negative result does not preclude SARS-Cov-2 infection and should not be used as the sole basis for treatment or other patient management decisions. A negative result may occur with  improper specimen collection/handling, submission of specimen other than nasopharyngeal swab, presence of viral mutation(s) within the areas targeted by this assay, and inadequate number of viral copies(<138 copies/mL). A negative result must be combined with clinical observations, patient history, and epidemiological information. The expected result is Negative.  Fact Sheet for Patients:  EntrepreneurPulse.com.au  Fact Sheet for Healthcare Providers:  IncredibleEmployment.be  This test is no t yet approved or cleared by the [place] FDA and  has been authorized for detection and/or  diagnosis of SARS-CoV-2 by FDA under an Emergency Use Authorization (EUA). This EUA will remain  in effect (meaning this test can be used) for the duration of the COVID-19 declaration under Section 564(b)(1) of the Act, 21 U.S.C.section 360bbb-3(b)(1), unless the authorization is terminated  or revoked sooner.       Influenza A by PCR NEGATIVE NEGATIVE Final   Influenza B by PCR NEGATIVE NEGATIVE Final    Comment: (NOTE) The Xpert Xpress SARS-CoV-2/FLU/RSV plus assay is intended as an aid in the diagnosis of influenza from Nasopharyngeal swab specimens and should not be used as a sole basis for treatment. Nasal washings and aspirates are unacceptable for Xpert Xpress SARS-CoV-2/FLU/RSV testing.  Fact Sheet for Patients: EntrepreneurPulse.com.au  Fact Sheet for Healthcare Providers: IncredibleEmployment.be  This test is not yet approved or cleared by the [place] FDA and has been authorized for detection and/or diagnosis of SARS-CoV-2 by FDA under an Emergency Use Authorization (EUA). This EUA will remain in effect (meaning this test can be used) for the duration of the COVID-19 declaration under Section 564(b)(1) of the Act, 21 U.S.C. section 360bbb-3(b)(1), unless the authorization is terminated or revoked.  Performed at [facility], [place] [address] [place], [place] [address]   MRSA PCR Screening     Status: None   Collection Time: 04/11/2020  7:50 PM   Specimen: Nasopharyngeal  Result Value Ref Range Status   MRSA by PCR NEGATIVE NEGATIVE Final    Comment:        The GeneXpert MRSA Assay (FDA approved for NASAL specimens only), is one component of a comprehensive MRSA colonization surveillance program. It is not intended to diagnose MRSA infection nor to guide or monitor treatment for MRSA infections. Performed at [facility], [place] [address] [place], [place] [address]           Radiology Studies: CT ABDOMEN PELVIS WO CONTRAST  Result Date: 04/23/2020 CLINICAL DATA:  Elevated lactate it acid and hypotension. Status post total colectomy. EXAM: CT ABDOMEN AND PELVIS WITHOUT CONTRAST TECHNIQUE: Multidetector CT imaging of the abdomen and pelvis was performed following the standard protocol without IV contrast. COMPARISON:  CT abdomen pelvis 04/18/2020 FINDINGS: LOWER CHEST: Intermediate sized right and small left pleural effusion. Right basilar consolidation. HEPATOBILIARY: Multiple hepatic cysts are unchanged. Distended gallbladder without biliary dilatation. PANCREAS: Normal pancreas. No ductal dilatation or peripancreatic fluid collection. SPLEEN: Normal. ADRENALS/URINARY TRACT: The adrenal glands are normal. No hydronephrosis, nephroureterolithiasis or solid renal mass. The urinary bladder is normal for degree of distention STOMACH/BOWEL: Status post recent total colectomy. There is multifocal pneumoperitoneum throughout the abdomen. The greatest concentration of free air is in the right upper quadrant. Residual rectum is unremarkable. There is a right-sided ileostomy. No small bowel dilatation. There is mild mesenteric edema and free fluid within the abdomen. VASCULAR/LYMPHATIC: There is calcific atherosclerosis of the abdominal aorta. No lymphadenopathy. REPRODUCTIVE: Status post hysterectomy. No adnexal mass. MUSCULOSKELETAL. No bony spinal canal stenosis or focal osseous abnormality. OTHER: Packing material in ventral abdominal incisions. Gas tracking along the tissue superficial to the right greater than left anterior abdominal wall. IMPRESSION: 1. Status post recent total colectomy with multifocal pneumoperitoneum throughout the abdomen. The greatest concentration of free air is in the right upper quadrant. This is favored to be post-operative. 2. Dilated gallbladder is nonspecific in this setting. 3. Intermediate sized right and small left pleural effusions with  right basilar consolidation. Aortic Atherosclerosis (ICD10-I70.0). Electronically Signed   By: Ulyses Jarred M.D.   On: 04/23/2020 00:17   US RENAL  Result Date: 04/23/2020 CLINICAL DATA:  85 year old female status post total colectomy. Query hydronephrosis, ureteral trauma. EXAM: RENAL / URINARY TRACT ULTRASOUND COMPLETE COMPARISON:  CT Abdomen and Pelvis 04/04/2020 and earlier. FINDINGS: Right Kidney: Renal measurements: 8.1 x 4.7 x 5.7 cm = volume: 114 mL. Echogenic right renal cortex (image 2). No right hydronephrosis or renal lesion. Left Kidney: Renal measurements: 8.0 x 4.1 x 4.1 cm = volume: 69 mL. Left renal cortex also appears increased in echogenicity relative to the spleen (image 14), similar to the right. No left hydronephrosis or renal lesion. Bladder: Decompressed, Foley catheter balloon visible. Other: None. IMPRESSION: Echogenic kidneys consistent with chronic medical renal disease. No hydronephrosis. Electronically Signed   By: Genevie Ann M.D.   On: 04/23/2020 08:11   DG CHEST PORT 1 VIEW  Result Date: 04/21/2020 CLINICAL DATA:  Hypotension EXAM: PORTABLE CHEST 1 VIEW COMPARISON:  04/12/2020, 04/18/2020 FINDINGS: Single frontal view of the chest demonstrates enteric catheter passing below diaphragm, tip excluded by collimation. The side port projects over the gastric body. Cardiac silhouette is unremarkable. There is elevation of the right hemidiaphragm. Rounded lucencies projecting over the right upper quadrant do not correspond to any loops of bowel on preceding CT exam. Given findings on prior CT study, I cannot exclude pneumoperitoneum. Repeat CT or left lateral decubitus radiographs of the abdomen may be useful. Patchy areas of consolidation at the lung bases likely reflect atelectasis. Small right effusion is suspected. No pneumothorax. IMPRESSION: 1. Rounded gas lucencies projecting over right upper quadrant, suspicious for pneumoperitoneum. Repeat CT of the abdomen and pelvis may be  useful. 2. Bibasilar consolidation, likely atelectasis. Trace right pleural effusion. 3. Enteric catheter as above. Critical Value/emergent results were called by telephone at the time of interpretation on 04/24/2020 at 10:51 pm to provider [facility] , who verbally acknowledged these results. Electronically Signed   By: Randa Ngo M.D.   On: 04/04/2020 22:55   DG Abd Portable 1V  Result Date: 04/23/2020 CLINICAL DATA:  Large bowel obstruction EXAM: PORTABLE ABDOMEN - 1 VIEW COMPARISON:  None. FINDINGS: Residual contrast within cecum, ascending colon,  transverse colon, and descending colon as well as distal small bowel. There is distension of the cecum to 9.7 cm. IMPRESSION: Residual contrast within the colon proximal to the sigmoid reflecting a degree of persistent obstruction. Distension of the cecum to 9.7 cm. Electronically Signed   By: Macy Mis M.D.   On: 04/12/2020 09:27   ECHOCARDIOGRAM COMPLETE  Result Date: 04/21/2020    ECHOCARDIOGRAM REPORT   Patient Name:   MACALA BALDONADO [facility] Date of Exam: 04/21/2020 Medical Rec #:  [id number]        Height:       62.0 in Accession #:    [id number]       Weight:       132.0 lb Date of Birth:  1934/04/30       BSA:          1.602 m Patient Age:    81 years         BP:           137/77 mmHg Patient Gender: F                HR:           110 bpm. Exam Location:  Inpatient Procedure: 2D Echo Indications:    Chest Pain  History:        Patient has no prior history of Echocardiogram examinations.  Sonographer:    Mikki Santee RDCS (AE) Referring Phys: [id number] AMRIT ADHIKARI IMPRESSIONS  1. Left ventricular ejection fraction, by estimation, is 60 to 65%. The left ventricle has normal function. The left ventricle has no regional wall motion abnormalities. There is mild concentric left ventricular hypertrophy. Left ventricular diastolic parameters are consistent with Grade I diastolic dysfunction (impaired relaxation).  2. Right ventricular systolic  function is normal. The right ventricular size is normal. There is normal pulmonary artery systolic pressure. The estimated right ventricular systolic pressure is 30.8 mmHg.  3. The mitral valve is normal in structure. No evidence of mitral valve regurgitation. No evidence of mitral stenosis.  4. The aortic valve is normal in structure. Aortic valve regurgitation is not visualized. No aortic stenosis is present.  5. The inferior vena cava is normal in size with greater than 50% respiratory variability, suggesting right atrial pressure of 3 mmHg. FINDINGS  Left Ventricle: Left ventricular ejection fraction, by estimation, is 60 to 65%. The left ventricle has normal function. The left ventricle has no regional wall motion abnormalities. The left ventricular internal cavity size was normal in size. There is  mild concentric left ventricular hypertrophy. Left ventricular diastolic parameters are consistent with Grade I diastolic dysfunction (impaired relaxation). Normal left ventricular filling pressure. Right Ventricle: The right ventricular size is normal. No increase in right ventricular wall thickness. Right ventricular systolic function is normal. There is normal pulmonary artery systolic pressure. The tricuspid regurgitant velocity is 2.63 m/s, and  with an assumed right atrial pressure of 3 mmHg, the estimated right ventricular systolic pressure is 65.7 mmHg. Left Atrium: Left atrial size was normal in size. Right Atrium: Right atrial size was normal in size. Pericardium: There is no evidence of pericardial effusion. Mitral Valve: The mitral valve is normal in structure. No evidence of mitral valve regurgitation. No evidence of mitral valve stenosis. Tricuspid Valve: The tricuspid valve is normal in structure. Tricuspid valve regurgitation is mild . No evidence of tricuspid stenosis. Aortic Valve: The aortic valve is normal in structure. Aortic valve regurgitation is not visualized. No aortic stenosis is present.  Pulmonic Valve: The pulmonic valve was normal in structure. Pulmonic valve regurgitation is not visualized. No evidence of pulmonic stenosis. Aorta: The aortic root is normal in size and structure. Venous: The inferior vena cava is normal in size with greater than 50% respiratory variability, suggesting right atrial pressure of 3 mmHg. IAS/Shunts: No atrial level shunt detected by color flow Doppler.  LEFT VENTRICLE PLAX 2D LVIDd:         2.70 cm  Diastology LVIDs:         2.00 cm  LV e' medial:    5.00 cm/s LV PW:         1.30 cm  LV E/e' medial:  10.9 LV IVS:        1.30 cm  LV e' lateral:   7.94 cm/s LVOT diam:     2.20 cm  LV E/e' lateral: 6.8 LV SV:         81 LV SV Index:   51 LVOT Area:     3.80 cm  LEFT ATRIUM           Index       RIGHT ATRIUM           Index LA diam:      2.70 cm 1.69 cm/m  RA Area:     13.30 cm LA Vol (A2C): 31.9 ml 19.91 ml/m RA Volume:   28.40 ml  17.73 ml/m LA Vol (A4C): 38.6 ml 24.09 ml/m  AORTIC VALVE LVOT Vmax:   112.00 cm/s LVOT Vmean:  68.000 cm/s LVOT VTI:    0.213 m  AORTA Ao Root diam: 2.80 cm MITRAL VALVE               TRICUSPID VALVE MV Area (PHT): 4.75 cm    TR Peak grad:   27.7 mmHg MV Decel Time: 160 msec    TR Vmax:        263.00 cm/s MV E velocity: 54.33 cm/s MV A velocity: 98.47 cm/s  SHUNTS MV E/A ratio:  0.55        Systemic VTI:  0.21 m                            Systemic Diam: 2.20 cm Dani Gobble Croitoru MD Electronically signed by Sanda Klein MD Signature Date/Time: 04/21/2020/2:59:10 PM    Final         Scheduled Meds: . bisacodyl  10 mg Rectal Daily  . chlorhexidine  15 mL Mouth Rinse BID  . Chlorhexidine Gluconate Cloth  6 each Topical Daily  . lip balm  1 application Topical BID  . mouth rinse  15 mL Mouth Rinse q12n4p  . metoprolol tartrate  2.5 mg Intravenous Q8H  . polyethylene glycol  17 g Oral BID   Continuous Infusions: . sodium chloride 10 mL/hr at 04/23/20 0800  . calcium gluconate    . lactated ringers 999 mL/hr at 04/23/20 0036   . magnesium sulfate bolus IVPB Stopped (04/23/20 1022)  . methocarbamol (ROBAXIN) IV Stopped (03/28/2020 2300)  . norepinephrine (LEVOPHED) Adult infusion 4 mcg/min (04/23/20 0800)  . piperacillin-tazobactam Stopped (04/23/20 0420)  .  sodium bicarbonate (isotonic) infusion in sterile water       LOS: 9 days    Time spent: 35 minutes    Irine Seal, MD [facility]   To contact the attending provider between 7A-7P or the covering provider during after hours 7P-7A, please log into the web site [email or web address]  and access using universal [place] password for that web site. If you do not have the password, please call the hospital operator.  04/23/2020, 10:20 AM

## 2020-04-23 NOTE — Progress Notes (Signed)
NAME:  Martha Ortega, MRN:  [id number], DOB:  10/24/1934, LOS: 9 ADMISSION DATE:  04/15/2020, CONSULTATION DATE: 04/24/2020 REFERRING MD: Dr. Irine Seal, CHIEF COMPLAINT: Abdominal pain  Brief History:  85 year old with chronic diverticulitis with bowel obstruction from sigmoid stricture that failed medical management s/p ex lap, total colectomy, end ileostomy 12/29 who is hypotensive post-op.   History of Present Illness:  Admitted 12/21 with LLQ pain and bloody stool. CT abdomen/pelivs on admission with diverticulitis of the distal descending colon complicated by a sinus tract and 18 mm collection in the low pericolic gutter. Managed conservatively with NG tube. Liquid diet, IVF. Repeat CT AP 12/25 appeared mildly worse with wall thickening, no frank perforation. Was not taking PO so taken to OR 12/29 for surgery. Now hypotensive despite IVF boluses.    Past Medical History:  Hypertension Diverticulitis  Significant Hospital Events:  Exploratory laparotomy 12/29  Consults:  General surgery PCCM  Procedures:  Ex lap, total colectomy and end ileostomy 12/29  Significant Diagnostic Tests:  CT abdomen 12/29  Micro Data:  12/2p UCx, BcX x 2 pending  Antimicrobials:  Zosyn  Interim History / Subjective:  No overnight events Feels comfortable this morning Mild abdominal discomfort  Objective   Blood pressure 120/62, pulse (!) 138, temperature 97.8 F (36.6 C), temperature source Oral, resp. rate (!) 30, height 5\' 2"  (1.575 m), weight 59.9 kg, SpO2 90 %.        Intake/Output Summary (Last 24 hours) at 04/23/2020 1022 Last data filed at 04/23/2020 0800 Gross per 24 hour  Intake 6616.52 ml  Output 1325 ml  Net 5291.52 ml   Filed Weights   04/05/2020 1333  Weight: 59.9 kg    Examination: General: Frail, elderly lady, comfortable HENT: Moist oral mucosa Lungs: Clear breath sounds Cardiovascular: S1-S2 appreciated Abdomen: Bowel sounds hypoactive,  postsurgical changes,, ostomy present Extremities: Warm and dry Neuro: Alert, moves all extremities GU: [facility] Problem list     Assessment & Plan:  Septic shock Hypovolemic shock -Secondary to intra-abdominal sepsis -Continue fluid resuscitation -Continue pressors-on Levophed -Continue current antibiotics -She is afebrile with stable CBC, antibiotics may need escalation depending on cultures  Pneumoperitoneum -Postoperative changes -Currently on Zosyn  Acute kidney injury -Maintain normotension -Adequate renal perfusion -Avoid nephrotoxic's -Continue IV fluids  Bowel obstruction due to sigmoid stricture and contained perforation S/p exploratory laparotomy and total colectomy and end ileostomy History of diverticulitis -Keep NG tube to suction -N.p.o. at present -Continue supportive measures -  History of hypertension -Hold off on blood pressure medications at present  Acute blood loss anemia -Transfuse per protocol  Best practice (evaluated daily)  Diet: N.p.o. Pain/Anxiety/Delirium protocol (if indicated): Tylenol, morphine as needed VAP protocol (if indicated): Not indicated DVT prophylaxis: SCD GI prophylaxis: Pepcid Glucose control: SSI Mobility: Bedrest Disposition: ICU  Goals of Care:  Last date of multidisciplinary goals of care discussion: Family and staff present: Family member at bedside Summary of discussion:  Follow up goals of care discussion due:  Code Status: Full code  Labs   CBC: Recent Labs  Lab 04/20/20 0417 04/21/20 0549 04/19/2020 0442 04/17/2020 2234 04/23/20 0251  WBC 12.3* 11.6* 14.2* 8.4 12.3*  NEUTROABS 8.9* 9.4* 12.7* 7.4 11.2*  HGB 7.4* 7.2* 7.7* 9.5* 9.6*  HCT 23.1* 22.0* 23.6* 28.5* 28.5*  MCV 84.9 83.7 81.9 87.4 86.1  PLT 472* 430* 514* 411* 284    Basic Metabolic Panel: Recent Labs  Lab 04/19/20 0505 04/20/20 0417 04/21/20  1937 03/29/2020 0442 04/08/2020 2234 04/23/20 0251  NA 140 139  139 133* 137 135  K 4.2 3.9 4.4 4.3 4.1 4.1  CL 110 109 108 104 109 106  CO2 20* 19* 19* 18* 17* 17*  GLUCOSE 109* 104* 102* 142* 99 95  BUN 13 15 19  25* 24* 25*  CREATININE 2.04* 1.96* 2.03* 2.50* 2.53* 2.54*  CALCIUM 8.4* 8.2* 8.3* 8.1* 7.4* 7.5*  MG 1.7 1.8  --   --   --  1.6*  PHOS  --   --   --   --   --  3.9   GFR: Estimated Creatinine Clearance: 13.3 mL/min (A) (by C-G formula based on SCr of 2.54 mg/dL (H)). Recent Labs  Lab 04/18/20 0509 04/19/20 0505 04/21/20 0549 03/26/2020 0442 03/26/2020 2234 04/23/20 0023 04/23/20 0251  PROCALCITON  --   --   --   --  33.28  --  38.94  WBC 10.2   < > 11.6* 14.2* 8.4  --  12.3*  LATICACIDVEN 1.2  --   --   --  2.5* 1.8  --    < > = values in this interval not displayed.    Liver Function Tests: Recent Labs  Lab 04/07/2020 2234 04/23/20 0251  AST 33  --   ALT 14  --   ALKPHOS 30*  --   BILITOT 0.9  --   PROT 4.0*  --   ALBUMIN 1.8* 2.4*   No results for input(s): LIPASE, AMYLASE in the last 168 hours. No results for input(s): AMMONIA in the last 168 hours.  ABG No results found for: PHART, PCO2ART, PO2ART, HCO3, TCO2, ACIDBASEDEF, O2SAT   Coagulation Profile: No results for input(s): INR, PROTIME in the last 168 hours.  Cardiac Enzymes: No results for input(s): CKTOTAL, CKMB, CKMBINDEX, TROPONINI in the last 168 hours.  HbA1C: Hgb A1c MFr Bld  Date/Time Value Ref Range Status  11/25/2010 09:33 AM 5.5 4.6 - 6.5 % Final    Comment:    Glycemic Control Guidelines for People with Diabetes:Non Diabetic:  <6%Goal of Therapy: <7%Additional Action Suggested:  >8%     CBG: No results for input(s): GLUCAP in the last 168 hours.  Review of Systems:   Some abdominal discomfort  Past Medical History:  She,  has a past medical history of Abnormal blood findings, Allergic rhinitis, History of diverticulitis of colon, Hyperglycemia (09), Hyperlipidemia, Hypertension, and Stroke (cerebrum) ([place]).   Surgical History:   Past  Surgical History:  Procedure Laterality Date   ABDOMINAL HYSTERECTOMY     APPENDECTOMY     Patient states not sure if this is true   barium swallow  10/18/03   CARDIOVASCULAR STRESS TEST  07/21/04   COLON RESECTION N/A 04/10/2020   Procedure: TOTAL ABDOMINAL COLECTOMY WITH ILEOSTOMY REPAIR OF STOMACH INJURY;  Surgeon: Kieth Brightly, Arta Bruce, MD;  Location: [facility];  Service: General;  Laterality: N/A;   CYSTECTOMY  93   R axilla   CYSTECTOMY  97   R breast     Social History:   reports that she quit smoking about 42 years ago. She has never used smokeless tobacco. She reports that she does not drink alcohol and does not use drugs.   Family History:  Her family history includes Arthritis in her mother and sister; Diabetes in her sister; Hypertension in her father; Lupus in her sister; Prostate cancer in her brother; Stroke in her father.   Allergies Allergies  Allergen Reactions   Amlodipine  Swelling    Throat and mouth swelling   Fluticasone Propionate     REACTION: does not help   Loratadine     REACTION: does not work   Lovastatin     REACTION: was not effective   Omeprazole Swelling    Throat, lips, and mouth swelling   Zocor [Simvastatin - High Dose]     Mouth and throat swelled     Home Medications  Prior to Admission medications   Medication Sig Start Date End Date Taking? Authorizing Provider  aspirin 81 MG tablet Take 81 mg by mouth daily.   Yes [provider]  Calcium Carbonate-Vit D-Min (CALCIUM 1200 PO) Take 1 tablet by mouth daily.   Yes [provider]  hydrALAZINE (APRESOLINE) 50 MG tablet Take 50 mg by mouth in the morning and at bedtime. 11/08/19  Yes [provider]  losartan (COZAAR) 50 MG tablet Take 50 mg by mouth daily. 02/11/20  Yes [provider]  metoprolol succinate (TOPROL-XL) 50 MG 24 hr tablet TAKE 1 TABLET DAILY WITH OR IMMEDIATELY FOLLOWING A MEAL 10/18/19  Yes Tower, Marne A, MD  rosuvastatin  (CRESTOR) 20 MG tablet TAKE 1 TABLET DAILY 12/05/19  Yes Tower, Wynelle Fanny, MD  amoxicillin-clavulanate (AUGMENTIN) 875-125 MG tablet Take 1 tablet by mouth 2 (two) times daily. Patient not taking: No sig reported 04/06/20   Venia Carbon, MD    The patient is critically ill with multiple organ systems failure and requires high complexity decision making for assessment and support, frequent evaluation and titration of therapies, application of advanced monitoring technologies and extensive interpretation of multiple databases. Critical Care Time devoted to patient care services described in this note independent of APP/resident time (if applicable)  is 35 minutes.   Sherrilyn Rist MD [facility] Personal pager: [phone] If unanswered, please page CCM On-call: [phone]

## 2020-04-23 NOTE — TOC Progression Note (Signed)
Transition of Care [facility]) - Progression Note    Patient Details  Name: Martha Ortega MRN: [id number] Date of Birth: 1934-12-08  Transition of Care [facility]) CM/SW Contact  Leeroy Cha, RN Phone Number: 04/23/2020, 7:22 AM  Clinical Narrative:    85 year old with chronic diverticulitis with bowel obstruction from sigmoid stricture that failed medical management s/p ex lap, total colectomy, end ileostomy 12/29 who is hypotensive post-op.   History of Present Illness:  Admitted 12/21 with LLQ pain and bloody stool. CT abdomen/pelivs on admission with diverticulitis of the distal descending colon complicated by a sinus tract and 18 mm collection in the low pericolic gutter. Managed conservatively with NG tube. Liquid diet, IVF. Repeat CT AP 12/25 appeared mildly worse with wall thickening, no frank perforation. Was not taking PO so taken to OR 12/29 for surgery. Now hypotensive despite IVF boluses.   Past Medical History:  HTN Diverticulitis  Significant Hospital Events:  Ex lap 12/29/   PLAN: home versus snf  widowed and lives alone. Progression: Iv robaxin, iv levophed-bp 94/55, iv zosyn  Expected Discharge Plan: [place] Barriers to Discharge: Continued Medical Work up  Expected Discharge Plan and Services Expected Discharge Plan: [place] Choice: [place] arrangements for the past 2 months: [place]: PT [place]: [place] Date [place]: 04/20/20 Time [place]: 1350 Representative spoke with at [place]: [place] ([place]) Interventions    Readmission Risk Interventions No flowsheet data found.

## 2020-04-23 NOTE — Consult Note (Signed)
Renal Service Consult Note [facility]  Martha Ortega 04/23/2020 Sol Blazing, MD Requesting Physician: Dr Grandville Silos, D.   Reason for Consult: Renal failure HPI: The patient is a 85 y.o. year-old w/ hx of CVA, HTN, HL, CKD stage IV presented on 12/21 w/ abd pain for 2 weeks, not improving w/ po abx, some BRBPR. CT showed diverticulitis of the distal desc colon w/ sinus tract and fluid collection. Treated w/ IV abx and NG at first. Repeat CT 12/25 appeared a bit worse but no frank perforation. Could not eat so taken to OR on 12/29 for surgery which included total colectomy w/ end ileostomy and gastric repair.  Post-op pt went into shock and moved to ICU w/ assoc decline in UOP and AKI. Given fluid boluses and then started on IV pressors for BP support.  Asked to see for renal failure.   170 cc UOP recorded today.  No UOP recorded for most of this stay, probably was not collected.  I/O are +14L and wt's are up 8-9 kg since admission.  Pt seen in ICU, levo gtt at 4-6 ug/min, BP's 80's- 110's.   ON bicarb at 75/hr. No IV contrast , nsaids, acei/ aRB for this hosp stay. Pt seen in ICU room, no c/o's, NG tube in.     ROS  denies CP  no joint pain   no HA  no blurry vision  no rash  no diarrhea  no nausea/ vomiting    Past Medical History  Past Medical History:  Diagnosis Date  . Abnormal blood findings    baseline cr 1.3, watching for renal insufficiency  . Allergic rhinitis   . History of diverticulitis of colon   . Hyperglycemia 09   mild  . Hyperlipidemia   . Hypertension   . Stroke (cerebrum) [facility])    Past Surgical History  Past Surgical History:  Procedure Laterality Date  . ABDOMINAL HYSTERECTOMY    . APPENDECTOMY     Patient states not sure if this is true  . barium swallow  10/18/03  . CARDIOVASCULAR STRESS TEST  07/21/04  . COLON RESECTION N/A 04/07/2020   Procedure: TOTAL ABDOMINAL COLECTOMY WITH ILEOSTOMY REPAIR OF STOMACH INJURY;  Surgeon:  Kieth Brightly, Arta Bruce, MD;  Location: [facility];  Service: General;  Laterality: N/A;  . CYSTECTOMY  93   R axilla  . CYSTECTOMY  97   R breast   Family History  Family History  Problem Relation Age of Onset  . Arthritis Mother   . Arthritis Sister   . Lupus Sister        ?  . Diabetes Sister   . Prostate cancer Brother   . Stroke Father   . Hypertension Father        ?   Social History  reports that she quit smoking about 42 years ago. She has never used smokeless tobacco. She reports that she does not drink alcohol and does not use drugs. Allergies  Allergies  Allergen Reactions  . Amlodipine Swelling    Throat and mouth swelling  . Fluticasone Propionate     REACTION: does not help  . Loratadine     REACTION: does not work  . Lovastatin     REACTION: was not effective  . Omeprazole Swelling    Throat, lips, and mouth swelling  . Zocor [Simvastatin - High Dose]     Mouth and throat swelled   Home medications Prior to Admission medications   Medication  Sig Start Date End Date Taking? Authorizing Provider  aspirin 81 MG tablet Take 81 mg by mouth daily.   Yes [provider]  Calcium Carbonate-Vit D-Min (CALCIUM 1200 PO) Take 1 tablet by mouth daily.   Yes [provider]  hydrALAZINE (APRESOLINE) 50 MG tablet Take 50 mg by mouth in the morning and at bedtime. 11/08/19  Yes [provider]  losartan (COZAAR) 50 MG tablet Take 50 mg by mouth daily. 02/11/20  Yes [provider]  metoprolol succinate (TOPROL-XL) 50 MG 24 hr tablet TAKE 1 TABLET DAILY WITH OR IMMEDIATELY FOLLOWING A MEAL 10/18/19  Yes Tower, Marne A, MD  rosuvastatin (CRESTOR) 20 MG tablet TAKE 1 TABLET DAILY 12/05/19  Yes Tower, Wynelle Fanny, MD  amoxicillin-clavulanate (AUGMENTIN) 875-125 MG tablet Take 1 tablet by mouth 2 (two) times daily. Patient not taking: No sig reported 04/06/20   Viviana Simpler I, MD     Vitals:   04/23/20 1403 04/23/20 1415 04/23/20 1430 04/23/20  1445  BP: (!) 107/46 (!) 89/61 93/71 (!) 90/47  Pulse: (!) 105 (!) 104 (!) 103 (!) 103  Resp: _0 Temp:      TempSrc:      SpO2: 99% 100% 100% 100%  Weight:      Height:       Exam Gen elderly female,  pt groggy but awakens easily No rash, cyanosis or gangrene Sclera anicteric, NG tube in place  No jvd or bruits Chest dec'd at R base, L side clear RRR no MRG Abd large midline wound dressed, and mid abd ostomy intact, +BS GU foley cath draining normal appearing yellowish clear urine small amts MS no joint effusions or deformity Ext 1-2+ bilat LE edema, no wounds or ulcers Neuro is alert, nonfocal    Home meds:  - asa 81/  crestor 20  - hydralazine 50 bid/ losartan 50 qd/ toprol xl 50 qd  - prn's/ vitamins/ supplements     Date  Creat  eGFR    2008- 2013 1.2- 1.4    2014- 2018 1.3- 1.7 36- 52, stage IIIb    2019  1.80  35    2020  1.78  33, stage IIIb    10/2019 2.06  28, stage IV    04/06/20 2.38  18, stage IV    04/10/2020 2.09  23    04/23/20 2.54  18    UA 12/29 - negative   UNa <10, UCr 122    CT abd pelv no contrast 12/20 - ADRENALS/URINARY TRACT: The adrenal glands are normal. No hydronephrosis, nephroureterolithiasis or solid renal mass. The urinary bladder is normal for degree of distention    CXR 12/29 - IMPRESSION: 1. Rounded gas lucencies projecting over right upper quadrant, suspicious for pneumoperitoneum. Repeat CT of the abdomen and pelvis may be useful. 2. Bibasilar consolidation, likely atelectasis. Trace right pleural effusion. 3. Enteric catheter as above.      I/O since admit = 16.6 L in and 1.9 L out = + 14.7 L     Wt's admit 59kg >> today 68kg   Assessment/ Plan: 1. AKI on CKD IV - b/l creat 2.0 from July 2021, eGFR 28. AKI likely due to post-op shock/ hypoperfusion.  No nephrotoxins noted.  On pressors. UA negative and renal US/ CT shows no obstruction. Admit creat 1.8 >> up to 2.5 today, making some urine. Urine Na low. Will cont IVF"s for  now, lower to 65 /hr. Will follow.  2. Diverticulitis - sp total colectomy on 12/29 3. Shock - postop, getting IVF's and pressor support, on IV abx w/ Zosyn 4. H/o CVA 5. Anemia - Hb in 9- 10 range, transfuse prn      Kelly Splinter  MD 04/23/2020, 3:21 PM  Recent Labs  Lab 04/21/2020 2234 04/23/20 0251  WBC 8.4 12.3*  HGB 9.5* 9.6*   Recent Labs  Lab 04/15/2020 2234 04/23/20 0251  K 4.1 4.1  BUN 24* 25*  CREATININE 2.53* 2.54*  CALCIUM 7.4* 7.5*  PHOS  --  3.9

## 2020-04-23 NOTE — Anesthesia Postprocedure Evaluation (Signed)
Anesthesia Post Note  Patient: Martha Ortega  Procedure(s) Performed: TOTAL ABDOMINAL COLECTOMY WITH ILEOSTOMY REPAIR OF STOMACH INJURY (N/A Abdomen)     Patient location during evaluation: PACU Anesthesia Type: General Level of consciousness: awake and alert and oriented Pain management: pain level controlled Vital Signs Assessment: post-procedure vital signs reviewed and stable Respiratory status: spontaneous breathing, nonlabored ventilation and respiratory function stable Cardiovascular status: stable Postop Assessment: no apparent nausea or vomiting Anesthetic complications: no   No complications documented.               Brennan Bailey

## 2020-04-23 NOTE — Consult Note (Signed)
Cardiology Consultation:   Patient ID: SHANYIA STINES MRN: [id number]; DOB: May 17, 1934  Admit date: 03/27/2020 Date of Consult: 04/23/2020  Primary Care Provider: [name], MD [facility] Cardiologist: Buford Dresser, MD [facility] Electrophysiologist:  None    Patient Profile:   Martha Ortega is a 85 y.o. female with a hx of nonhemorrhagic CVA, CKD stage IV followed by nephrology, HTN, HLD, and diverticulitis  who is being seen today for the evaluation of elevated troponin at the request of Dr. Grandville Silos.  History of Present Illness:   Martha Ortega has no significant cardiac history. No prior to admission ischemic evaluation or echo found in chart review. She takes a daily 81 mg Aspirin. She also takes medications for hypertension and hyperlipidemia, which is followed by her PCP.  The patient presented to the ED at [facility] on 03/31/2020 with 2 weeks of intermittent left lower abdominal pain and new bloody stools. Her PCP had given her antibiotics for presumed diverticulitis, however pain was still present. She had severe pain the morning of her admission which brought her into the ER.   In the ER she was tachycardic, afebrile, tachypneic and O2 was stable. Fecal occult positive. She had leukocytosis and lactic acidosis with AKI. CT showed recurrent descending colon diverticulitis complicated by sinus tract and collection in the lower paracolic catheter. She was started on IV abx and admitted. GI and genereal surgery saw. [name], liquid diet and IVF. Repeat CT 12/15 showed mildly worse with wall thickening and no frank perforation. The patient was taken to the OR 12/29. Patient is now hypotensive despite IVF. CCM also following.   On my interview, she states that she has not had any chest pain. Breathing has been stable. Most bothersome has been the NG tub, which causes her throat to be sore. She also endorses feeling thirsty. Her  abdominal discomfort is stable.  Past Medical History:  Diagnosis Date  . Abnormal blood findings    baseline cr 1.3, watching for renal insufficiency  . Allergic rhinitis   . History of diverticulitis of colon   . Hyperglycemia 09   mild  . Hyperlipidemia   . Hypertension   . Stroke (cerebrum) [facility])     Past Surgical History:  Procedure Laterality Date  . ABDOMINAL HYSTERECTOMY    . APPENDECTOMY     Patient states not sure if this is true  . barium swallow  10/18/03  . CARDIOVASCULAR STRESS TEST  07/21/04  . COLON RESECTION N/A 04/13/2020   Procedure: TOTAL ABDOMINAL COLECTOMY WITH ILEOSTOMY REPAIR OF STOMACH INJURY;  Surgeon: Kieth Brightly, Arta Bruce, MD;  Location: [facility] ORS;  Service: General;  Laterality: N/A;  . CYSTECTOMY  93   R axilla  . CYSTECTOMY  97   R breast     Home Medications:  Prior to Admission medications   Medication Sig Start Date End Date Taking? Authorizing Provider  aspirin 81 MG tablet Take 81 mg by mouth daily.   Yes [provider]  Calcium Carbonate-Vit D-Min (CALCIUM 1200 PO) Take 1 tablet by mouth daily.   Yes [provider]  hydrALAZINE (APRESOLINE) 50 MG tablet Take 50 mg by mouth in the morning and at bedtime. 11/08/19  Yes [provider]  losartan (COZAAR) 50 MG tablet Take 50 mg by mouth daily. 02/11/20  Yes [provider]  metoprolol succinate (TOPROL-XL) 50 MG 24 hr tablet TAKE 1 TABLET DAILY WITH OR IMMEDIATELY FOLLOWING A  MEAL 10/18/19  Yes Tower, Wynelle Fanny, MD  rosuvastatin (CRESTOR) 20 MG tablet TAKE 1 TABLET DAILY 12/05/19  Yes Tower, Wynelle Fanny, MD  amoxicillin-clavulanate (AUGMENTIN) 875-125 MG tablet Take 1 tablet by mouth 2 (two) times daily. Patient not taking: No sig reported 04/06/20   Venia Carbon, MD    Inpatient Medications: Scheduled Meds: . bisacodyl  10 mg Rectal Daily  . chlorhexidine  15 mL Mouth Rinse BID  . Chlorhexidine Gluconate Cloth  6 each Topical Daily  . lip balm  1  application Topical BID  . mouth rinse  15 mL Mouth Rinse q12n4p   Continuous Infusions: . sodium chloride 10 mL/hr at 04/23/20 1356  . famotidine (PEPCID) IV Stopped (04/23/20 1201)  . lactated ringers 999 mL/hr at 04/23/20 0036  . methocarbamol (ROBAXIN) IV Stopped (04/21/2020 2300)  . norepinephrine (LEVOPHED) Adult infusion 7 mcg/min (04/23/20 1356)  . piperacillin-tazobactam Stopped (04/23/20 1304)  .  sodium bicarbonate (isotonic) infusion in sterile water 75 mL/hr at 04/23/20 1055   PRN Meds: acetaminophen **OR** acetaminophen, alum & mag hydroxide-simeth, diphenhydrAMINE, HYDROcodone-acetaminophen, lactated ringers, lip balm, magic mouthwash, methocarbamol (ROBAXIN) IV, morphine injection, ondansetron **OR** ondansetron (ZOFRAN) IV, phenol  Allergies:    Allergies  Allergen Reactions  . Amlodipine Swelling    Throat and mouth swelling  . Fluticasone Propionate     REACTION: does not help  . Loratadine     REACTION: does not work  . Lovastatin     REACTION: was not effective  . Omeprazole Swelling    Throat, lips, and mouth swelling  . Zocor [Simvastatin - High Dose]     Mouth and throat swelled    Social History:   Social History   Socioeconomic History  . Marital status: Widowed    Spouse name: Not on file  . Number of children: Not on file  . Years of education: Not on file  . Highest education level: Not on file  Occupational History  . Occupation: unemployed  Tobacco Use  . Smoking status: Former Smoker    Quit date: 04/25/1978    Years since quitting: 42.0  . Smokeless tobacco: Never Used  Vaping Use  . Vaping Use: Never used  Substance and Sexual Activity  . Alcohol use: No    Alcohol/week: 0.0 standard drinks  . Drug use: No  . Sexual activity: Not Currently  Other Topics Concern  . Not on file  Social History Narrative   Widowed, not working, son lives close by   Regular exercise: walking videos 3-4 times weekly            Social  Determinants of Health   Financial Resource Strain: Not on file  Food Insecurity: Not on file  Transportation Needs: Not on file  Physical Activity: Not on file  Stress: Not on file  Social Connections: Not on file  Intimate Partner Violence: Not on file    Family History:   Family History  Problem Relation Age of Onset  . Arthritis Mother   . Arthritis Sister   . Lupus Sister        ?  . Diabetes Sister   . Prostate cancer Brother   . Stroke Father   . Hypertension Father        ?     ROS:  Please see the history of present illness.  Constitutional: Feels poorly overall, does not recall fevers or chills. HENT: Negative for ear pain and hearing loss.   Eyes: Negative for  loss of vision and eye pain.  Respiratory: Negative for cough, sputum, wheezing.   Cardiovascular: See HPI. Gastrointestinal: Positive for abdominal discomfort and hematochezia, as above Genitourinary: Negative for dysuria and hematuria.  Musculoskeletal: Negative for falls and myalgias.  Skin: Negative for itching and rash.  Neurological: Negative for focal weakness, focal sensory changes and loss of consciousness.  Endo/Heme/Allergies: Does not bruise/bleed easily.  All other ROS reviewed and negative.     Physical Exam/Data:   Vitals:   04/23/20 1330 04/23/20 1345 04/23/20 1400 04/23/20 1403  BP: (!) 92/39 (!) 112/49  (!) 107/46  Pulse: (!) 111 (!) 111 (!) 105 (!) 105  Resp: 14 14 11 11   Temp:      TempSrc:      SpO2: 99% 99% 100% 99%  Weight:      Height:        Intake/Output Summary (Last 24 hours) at 04/23/2020 1438 Last data filed at 04/23/2020 1356 Gross per 24 hour  Intake 5196.55 ml  Output 1400 ml  Net 3796.55 ml   Last 3 Weights 04/23/2020 03/31/2020 04/06/2020  Weight (lbs) 149 lb 11.1 oz 132 lb 132 lb  Weight (kg) 67.9 kg 59.875 kg 59.875 kg     Body mass index is 27.38 kg/m.  General:  Frail appearing elderly woman, NG tube in place HEENT: NG tube in place with dark  material draining Neck: no JVD appreciated Endocrine:  No thryomegaly Vascular: radial pulses 2+ bilaterally Cardiac:  normal S1, S2; RRR; no murmur appreciated Lungs:  clear to auscultation bilaterally, no wheezing, rhonchi or rales  Abd: soft, mildly tender  Ext: no edema Musculoskeletal:  Moves all 4 limbs independently Skin: warm and dry  Neuro:  no focal abnormalities noted Psych:  Normal affect   EKG:  The EKG was personally reviewed and demonstrates:  ST, 114bpm, low voltage, nonspecific T wave changes Telemetry:  Telemetry was personally reviewed and demonstrates:  Sinus tachycardia/normal sinus rhythm  Relevant CV Studies:  Echo 04/21/20  1. Left ventricular ejection fraction, by estimation, is 60 to 65%. The  left ventricle has normal function. The left ventricle has no regional  wall motion abnormalities. There is mild concentric left ventricular  hypertrophy. Left ventricular diastolic  parameters are consistent with Grade I diastolic dysfunction (impaired  relaxation).  2. Right ventricular systolic function is normal. The right ventricular  size is normal. There is normal pulmonary artery systolic pressure. The  estimated right ventricular systolic pressure is 10.2 mmHg.  3. The mitral valve is normal in structure. No evidence of mitral valve  regurgitation. No evidence of mitral stenosis.  4. The aortic valve is normal in structure. Aortic valve regurgitation is  not visualized. No aortic stenosis is present.  5. The inferior vena cava is normal in size with greater than 50%  respiratory variability, suggesting right atrial pressure of 3 mmHg.    Laboratory Data:  High Sensitivity Troponin:   Recent Labs  Lab 04/12/2020 2234 04/23/20 0023 04/23/20 0251  TROPONINIHS 106* 128* 139*     Chemistry Recent Labs  Lab 04/13/2020 0442 04/04/2020 2234 04/23/20 0251  NA 133* 137 135  K 4.3 4.1 4.1  CL 104 109 106  CO2 18* 17* 17*  GLUCOSE 142* 99 95  BUN  25* 24* 25*  CREATININE 2.50* 2.53* 2.54*  CALCIUM 8.1* 7.4* 7.5*  GFRNONAA 19* 18* 18*  ANIONGAP 11 11 12     Recent Labs  Lab 04/05/2020 2234 04/23/20 0251  PROT 4.0*  --  ALBUMIN 1.8* 2.4*  AST 33  --   ALT 14  --   ALKPHOS 30*  --   BILITOT 0.9  --    Hematology Recent Labs  Lab 04/16/2020 0442 04/17/2020 2234 04/23/20 0251  WBC 14.2* 8.4 12.3*  RBC 2.88* 3.26*  3.75* 3.31*  HGB 7.7* 9.5* 9.6*  HCT 23.6* 28.5* 28.5*  MCV 81.9 87.4 86.1  MCH 26.7 29.1 29.0  MCHC 32.6 33.3 33.7  RDW 15.7* 17.1* 17.1*  PLT 514* 411* 399   BNPNo results for input(s): BNP, PROBNP in the last 168 hours.  DDimer No results for input(s): DDIMER in the last 168 hours.   Radiology/Studies:  CT ABDOMEN PELVIS WO CONTRAST  Result Date: 04/23/2020 CLINICAL DATA:  Elevated lactate it acid and hypotension. Status post total colectomy. EXAM: CT ABDOMEN AND PELVIS WITHOUT CONTRAST TECHNIQUE: Multidetector CT imaging of the abdomen and pelvis was performed following the standard protocol without IV contrast. COMPARISON:  CT abdomen pelvis 04/18/2020 FINDINGS: LOWER CHEST: Intermediate sized right and small left pleural effusion. Right basilar consolidation. HEPATOBILIARY: Multiple hepatic cysts are unchanged. Distended gallbladder without biliary dilatation. PANCREAS: Normal pancreas. No ductal dilatation or peripancreatic fluid collection. SPLEEN: Normal. ADRENALS/URINARY TRACT: The adrenal glands are normal. No hydronephrosis, nephroureterolithiasis or solid renal mass. The urinary bladder is normal for degree of distention STOMACH/BOWEL: Status post recent total colectomy. There is multifocal pneumoperitoneum throughout the abdomen. The greatest concentration of free air is in the right upper quadrant. Residual rectum is unremarkable. There is a right-sided ileostomy. No small bowel dilatation. There is mild mesenteric edema and free fluid within the abdomen. VASCULAR/LYMPHATIC: There is calcific  atherosclerosis of the abdominal aorta. No lymphadenopathy. REPRODUCTIVE: Status post hysterectomy. No adnexal mass. MUSCULOSKELETAL. No bony spinal canal stenosis or focal osseous abnormality. OTHER: Packing material in ventral abdominal incisions. Gas tracking along the tissue superficial to the right greater than left anterior abdominal wall. IMPRESSION: 1. Status post recent total colectomy with multifocal pneumoperitoneum throughout the abdomen. The greatest concentration of free air is in the right upper quadrant. This is favored to be post-operative. 2. Dilated gallbladder is nonspecific in this setting. 3. Intermediate sized right and small left pleural effusions with right basilar consolidation. Aortic Atherosclerosis (ICD10-I70.0). Electronically Signed   By: Ulyses Jarred M.D.   On: 04/23/2020 00:17   US RENAL  Result Date: 04/23/2020 CLINICAL DATA:  85 year old female status post total colectomy. Query hydronephrosis, ureteral trauma. EXAM: RENAL / URINARY TRACT ULTRASOUND COMPLETE COMPARISON:  CT Abdomen and Pelvis 04/15/2020 and earlier. FINDINGS: Right Kidney: Renal measurements: 8.1 x 4.7 x 5.7 cm = volume: 114 mL. Echogenic right renal cortex (image 2). No right hydronephrosis or renal lesion. Left Kidney: Renal measurements: 8.0 x 4.1 x 4.1 cm = volume: 69 mL. Left renal cortex also appears increased in echogenicity relative to the spleen (image 14), similar to the right. No left hydronephrosis or renal lesion. Bladder: Decompressed, Foley catheter balloon visible. Other: None. IMPRESSION: Echogenic kidneys consistent with chronic medical renal disease. No hydronephrosis. Electronically Signed   By: Genevie Ann M.D.   On: 04/23/2020 08:11   DG CHEST PORT 1 VIEW  Result Date: 04/02/2020 CLINICAL DATA:  Hypotension EXAM: PORTABLE CHEST 1 VIEW COMPARISON:  04/19/2020, 04/18/2020 FINDINGS: Single frontal view of the chest demonstrates enteric catheter passing below diaphragm, tip excluded by  collimation. The side port projects over the gastric body. Cardiac silhouette is unremarkable. There is elevation of the right hemidiaphragm. Rounded lucencies projecting over the right  upper quadrant do not correspond to any loops of bowel on preceding CT exam. Given findings on prior CT study, I cannot exclude pneumoperitoneum. Repeat CT or left lateral decubitus radiographs of the abdomen may be useful. Patchy areas of consolidation at the lung bases likely reflect atelectasis. Small right effusion is suspected. No pneumothorax. IMPRESSION: 1. Rounded gas lucencies projecting over right upper quadrant, suspicious for pneumoperitoneum. Repeat CT of the abdomen and pelvis may be useful. 2. Bibasilar consolidation, likely atelectasis. Trace right pleural effusion. 3. Enteric catheter as above. Critical Value/emergent results were called by telephone at the time of interpretation on 04/21/2020 at 10:51 pm to provider [facility] , who verbally acknowledged these results. Electronically Signed   By: Randa Ngo M.D.   On: 03/30/2020 22:55   DG Abd Portable 1V  Result Date: 04/21/2020 CLINICAL DATA:  Large bowel obstruction EXAM: PORTABLE ABDOMEN - 1 VIEW COMPARISON:  None. FINDINGS: Residual contrast within cecum, ascending colon, transverse colon, and descending colon as well as distal small bowel. There is distension of the cecum to 9.7 cm. IMPRESSION: Residual contrast within the colon proximal to the sigmoid reflecting a degree of persistent obstruction. Distension of the cecum to 9.7 cm. Electronically Signed   By: Macy Mis M.D.   On: 04/20/2020 09:27   ECHOCARDIOGRAM COMPLETE  Result Date: 04/21/2020    ECHOCARDIOGRAM REPORT   Patient Name:   CESIA [facility] Date of Exam: 04/21/2020 Medical Rec #:  [id number]        Height:       62.0 in Accession #:    [id number]       Weight:       132.0 lb Date of Birth:  May 16, 1934       BSA:          1.602 m Patient Age:    34 years         BP:            137/77 mmHg Patient Gender: F                HR:           110 bpm. Exam Location:  Inpatient Procedure: 2D Echo Indications:    Chest Pain  History:        Patient has no prior history of Echocardiogram examinations.  Sonographer:    Mikki Santee RDCS (AE) Referring Phys: [id number] AMRIT ADHIKARI IMPRESSIONS  1. Left ventricular ejection fraction, by estimation, is 60 to 65%. The left ventricle has normal function. The left ventricle has no regional wall motion abnormalities. There is mild concentric left ventricular hypertrophy. Left ventricular diastolic parameters are consistent with Grade I diastolic dysfunction (impaired relaxation).  2. Right ventricular systolic function is normal. The right ventricular size is normal. There is normal pulmonary artery systolic pressure. The estimated right ventricular systolic pressure is 30.8 mmHg.  3. The mitral valve is normal in structure. No evidence of mitral valve regurgitation. No evidence of mitral stenosis.  4. The aortic valve is normal in structure. Aortic valve regurgitation is not visualized. No aortic stenosis is present.  5. The inferior vena cava is normal in size with greater than 50% respiratory variability, suggesting right atrial pressure of 3 mmHg. FINDINGS  Left Ventricle: Left ventricular ejection fraction, by estimation, is 60 to 65%. The left ventricle has normal function. The left ventricle has no regional wall motion abnormalities. The left ventricular internal cavity size was normal in size. There is  mild concentric left ventricular hypertrophy. Left ventricular diastolic parameters are consistent with Grade I diastolic dysfunction (impaired relaxation). Normal left ventricular filling pressure. Right Ventricle: The right ventricular size is normal. No increase in right ventricular wall thickness. Right ventricular systolic function is normal. There is normal pulmonary artery systolic pressure. The tricuspid regurgitant velocity is 2.63  m/s, and  with an assumed right atrial pressure of 3 mmHg, the estimated right ventricular systolic pressure is 41.6 mmHg. Left Atrium: Left atrial size was normal in size. Right Atrium: Right atrial size was normal in size. Pericardium: There is no evidence of pericardial effusion. Mitral Valve: The mitral valve is normal in structure. No evidence of mitral valve regurgitation. No evidence of mitral valve stenosis. Tricuspid Valve: The tricuspid valve is normal in structure. Tricuspid valve regurgitation is mild . No evidence of tricuspid stenosis. Aortic Valve: The aortic valve is normal in structure. Aortic valve regurgitation is not visualized. No aortic stenosis is present. Pulmonic Valve: The pulmonic valve was normal in structure. Pulmonic valve regurgitation is not visualized. No evidence of pulmonic stenosis. Aorta: The aortic root is normal in size and structure. Venous: The inferior vena cava is normal in size with greater than 50% respiratory variability, suggesting right atrial pressure of 3 mmHg. IAS/Shunts: No atrial level shunt detected by color flow Doppler.  LEFT VENTRICLE PLAX 2D LVIDd:         2.70 cm  Diastology LVIDs:         2.00 cm  LV e' medial:    5.00 cm/s LV PW:         1.30 cm  LV E/e' medial:  10.9 LV IVS:        1.30 cm  LV e' lateral:   7.94 cm/s LVOT diam:     2.20 cm  LV E/e' lateral: 6.8 LV SV:         81 LV SV Index:   51 LVOT Area:     3.80 cm  LEFT ATRIUM           Index       RIGHT ATRIUM           Index LA diam:      2.70 cm 1.69 cm/m  RA Area:     13.30 cm LA Vol (A2C): 31.9 ml 19.91 ml/m RA Volume:   28.40 ml  17.73 ml/m LA Vol (A4C): 38.6 ml 24.09 ml/m  AORTIC VALVE LVOT Vmax:   112.00 cm/s LVOT Vmean:  68.000 cm/s LVOT VTI:    0.213 m  AORTA Ao Root diam: 2.80 cm MITRAL VALVE               TRICUSPID VALVE MV Area (PHT): 4.75 cm    TR Peak grad:   27.7 mmHg MV Decel Time: 160 msec    TR Vmax:        263.00 cm/s MV E velocity: 54.33 cm/s MV A velocity: 98.47 cm/s   SHUNTS MV E/A ratio:  0.55        Systemic VTI:  0.21 m                            Systemic Diam: 2.20 cm Dani Gobble Croitoru MD Electronically signed by Sanda Klein MD Signature Date/Time: 04/21/2020/2:59:10 PM    Final    Assessment and Plan:   Sepsis, with diverticulitis/GI as source, with hypovolemic shock - since surgery, she has had hypotension, minimal UOP  and lactic acidosis. - CT concerning for pneumoperitoneum which gen surgery said was expected - Hypotensive despite IVF. On norepinephrine drip currently.  - lactic acidosis trending down - still has leukocytosis and tachycardia. Sinus tachycardia is expected given her clinical picture, likely compensatory. Would avoid use of beta blockers while on IV pressors - procal elevated and BC repeated - IV abx per primary team - PCCM consulted and patient was administered albumin  Recurrent diverticulitis s/p ex-lap and total colectomy and end ileostomy now with bowel obstruction due to sigmoid stricture with contained perforation - NPO at present. NG tube - Gen surgery following and recommend TPN  Elevated troponin - HS troponin elevated in the setting of septic and hypovolemic shock; asymptomatic, normal echo supports demand ischemia over ACS - Patient has no prior significant cardiac history.  - Pre-op echo 12/28 showed preserved EF, G1DD - her risk factors for CAD include HTN and HLD.  - EKG showed ST with no ischemic changes. Heart rates continually elevated and patient was started on low dose IV metoprolol, which I have discontinued (see above re: sinus tachydcardia). Elevated rates exacerbated by acute illness.  - In the setting of anemia/suspected GBI and no cardiac symptoms with normal echo, would not start IV heparin.  AKI with oliguira - Baseline creatiine 1.7-2 - Patient is +14L since admission - creatinine 2.54 today - Bicarb 17, on bicarb drip - Low urine output likely related to shock - also on IVF  Hypertension,  history of, now with hypotension - antihypertensives held in the setting of shock - was on hydralazine 50mg  BID, Toprol 50mg  daily, losartan 50mg  daily as an outpatient  Hyperlipidemia - LDL 63 11/01/19 - was on rosuvastatin as an outpatient. Resume when able  Electrolyte abnormalities - Keep Mag >2 and K>4  Anemia with suspected GIB - Aspirin held - Hgb has remained in the 7 range.Today 9.6 - GI following and no plans for intervention - no indication for heparin at this time  Recommendations shared with Dr. Grandville Silos.   For questions or updates, please contact [place] Please consult [email or web address] for contact info under   Signed, Buford Dresser, MD  04/23/2020 2:38 PM

## 2020-04-24 ENCOUNTER — Inpatient Hospital Stay (HOSPITAL_COMMUNITY): Payer: Medicare Other

## 2020-04-24 ENCOUNTER — Encounter (HOSPITAL_COMMUNITY): Payer: Self-pay | Admitting: Internal Medicine

## 2020-04-24 DIAGNOSIS — R6521 Severe sepsis with septic shock: Secondary | ICD-10-CM | POA: Diagnosis not present

## 2020-04-24 DIAGNOSIS — A419 Sepsis, unspecified organism: Secondary | ICD-10-CM | POA: Diagnosis not present

## 2020-04-24 DIAGNOSIS — K56609 Unspecified intestinal obstruction, unspecified as to partial versus complete obstruction: Secondary | ICD-10-CM | POA: Diagnosis not present

## 2020-04-24 DIAGNOSIS — N184 Chronic kidney disease, stage 4 (severe): Secondary | ICD-10-CM | POA: Diagnosis not present

## 2020-04-24 DIAGNOSIS — D62 Acute posthemorrhagic anemia: Secondary | ICD-10-CM | POA: Diagnosis not present

## 2020-04-24 DIAGNOSIS — K5721 Diverticulitis of large intestine with perforation and abscess with bleeding: Secondary | ICD-10-CM | POA: Diagnosis not present

## 2020-04-24 LAB — GLUCOSE, CAPILLARY
Glucose-Capillary: 159 mg/dL — ABNORMAL HIGH (ref 70–99)
Glucose-Capillary: 180 mg/dL — ABNORMAL HIGH (ref 70–99)

## 2020-04-24 LAB — CBC WITH DIFFERENTIAL/PLATELET
Abs Immature Granulocytes: 0.38 10*3/uL — ABNORMAL HIGH (ref 0.00–0.07)
Basophils Absolute: 0.1 10*3/uL (ref 0.0–0.1)
Basophils Relative: 0 %
Eosinophils Absolute: 0 10*3/uL (ref 0.0–0.5)
Eosinophils Relative: 0 %
HCT: 25.3 % — ABNORMAL LOW (ref 36.0–46.0)
Hemoglobin: 8.3 g/dL — ABNORMAL LOW (ref 12.0–15.0)
Immature Granulocytes: 2 %
Lymphocytes Relative: 4 %
Lymphs Abs: 0.7 10*3/uL (ref 0.7–4.0)
MCH: 28.4 pg (ref 26.0–34.0)
MCHC: 32.8 g/dL (ref 30.0–36.0)
MCV: 86.6 fL (ref 80.0–100.0)
Monocytes Absolute: 0.6 10*3/uL (ref 0.1–1.0)
Monocytes Relative: 3 %
Neutro Abs: 16.1 10*3/uL — ABNORMAL HIGH (ref 1.7–7.7)
Neutrophils Relative %: 91 %
Platelets: 330 10*3/uL (ref 150–400)
RBC: 2.92 MIL/uL — ABNORMAL LOW (ref 3.87–5.11)
RDW: 17.7 % — ABNORMAL HIGH (ref 11.5–15.5)
WBC: 17.9 10*3/uL — ABNORMAL HIGH (ref 4.0–10.5)
nRBC: 0.3 % — ABNORMAL HIGH (ref 0.0–0.2)

## 2020-04-24 LAB — RENAL FUNCTION PANEL
Albumin: 2 g/dL — ABNORMAL LOW (ref 3.5–5.0)
Anion gap: 16 — ABNORMAL HIGH (ref 5–15)
BUN: 32 mg/dL — ABNORMAL HIGH (ref 8–23)
CO2: 20 mmol/L — ABNORMAL LOW (ref 22–32)
Calcium: 7.3 mg/dL — ABNORMAL LOW (ref 8.9–10.3)
Chloride: 101 mmol/L (ref 98–111)
Creatinine, Ser: 3.48 mg/dL — ABNORMAL HIGH (ref 0.44–1.00)
GFR, Estimated: 13 mL/min — ABNORMAL LOW (ref >60)
Glucose, Bld: 107 mg/dL — ABNORMAL HIGH (ref 70–99)
Phosphorus: 4.5 mg/dL (ref 2.5–4.6)
Potassium: 4.3 mmol/L (ref 3.5–5.1)
Sodium: 137 mmol/L (ref 135–145)

## 2020-04-24 LAB — URINE CULTURE: Culture: NO GROWTH

## 2020-04-24 LAB — MAGNESIUM: Magnesium: 3.1 mg/dL — ABNORMAL HIGH (ref 1.7–2.4)

## 2020-04-24 LAB — PROCALCITONIN: Procalcitonin: 51.24 ng/mL

## 2020-04-24 MED ORDER — STERILE WATER FOR INJECTION IV SOLN
INTRAVENOUS | Status: DC
  Filled 2020-04-24 (×2): qty 150

## 2020-04-24 MED ORDER — VANCOMYCIN HCL 1500 MG/300ML IV SOLN
1500.0000 mg | Freq: Once | INTRAVENOUS | Status: AC
  Administered 2020-04-24: 1500 mg via INTRAVENOUS
  Filled 2020-04-24: qty 300

## 2020-04-24 MED ORDER — VANCOMYCIN VARIABLE DOSE PER UNSTABLE RENAL FUNCTION (PHARMACIST DOSING): Status: DC

## 2020-04-24 MED ORDER — INSULIN ASPART 100 UNIT/ML ~~LOC~~ SOLN
0.0000 [IU] | SUBCUTANEOUS | Status: DC
  Administered 2020-04-24 – 2020-04-25 (×5): 1 [IU] via SUBCUTANEOUS
  Administered 2020-04-25 (×2): 2 [IU] via SUBCUTANEOUS
  Administered 2020-04-26 – 2020-04-27 (×7): 1 [IU] via SUBCUTANEOUS

## 2020-04-24 MED ORDER — LACTATED RINGERS IV BOLUS
500.0000 mL | Freq: Once | INTRAVENOUS | Status: AC
  Administered 2020-04-24: 500 mL via INTRAVENOUS

## 2020-04-24 MED ORDER — AMIODARONE LOAD VIA INFUSION
150.0000 mg | Freq: Once | INTRAVENOUS | Status: AC
  Administered 2020-04-24: 150 mg via INTRAVENOUS
  Filled 2020-04-24: qty 83.34

## 2020-04-24 MED ORDER — ALBUMIN HUMAN 25 % IV SOLN
25.0000 g | Freq: Once | INTRAVENOUS | Status: AC
  Administered 2020-04-24: 25 g via INTRAVENOUS
  Filled 2020-04-24: qty 100

## 2020-04-24 MED ORDER — METOPROLOL TARTRATE 5 MG/5ML IV SOLN
2.5000 mg | Freq: Once | INTRAVENOUS | Status: AC
  Administered 2020-04-24: 2.5 mg via INTRAVENOUS
  Filled 2020-04-24: qty 5

## 2020-04-24 MED ORDER — TRAVASOL 10 % IV SOLN
INTRAVENOUS | Status: AC
  Filled 2020-04-24: qty 432

## 2020-04-24 MED ORDER — AMIODARONE HCL IN DEXTROSE 360-4.14 MG/200ML-% IV SOLN
60.0000 mg/h | INTRAVENOUS | Status: DC
  Administered 2020-04-24 (×2): 60 mg/h via INTRAVENOUS
  Filled 2020-04-24 (×2): qty 200

## 2020-04-24 MED ORDER — NOREPINEPHRINE 4 MG/250ML-% IV SOLN
0.0000 ug/min | INTRAVENOUS | Status: DC
  Administered 2020-04-24: 10 ug/min via INTRAVENOUS
  Administered 2020-04-24: 16 ug/min via INTRAVENOUS
  Administered 2020-04-25: 15 ug/min via INTRAVENOUS
  Filled 2020-04-24 (×2): qty 250

## 2020-04-24 MED ORDER — AMIODARONE HCL IN DEXTROSE 360-4.14 MG/200ML-% IV SOLN
30.0000 mg/h | INTRAVENOUS | Status: DC
  Administered 2020-04-25 – 2020-04-28 (×10): 30 mg/h via INTRAVENOUS
  Filled 2020-04-24 (×9): qty 200

## 2020-04-24 MED ORDER — LACTATED RINGERS IV SOLN: INTRAVENOUS | Status: DC

## 2020-04-24 MED ORDER — SODIUM CHLORIDE 0.9 % IV SOLN
200.0000 mg | Freq: Once | INTRAVENOUS | Status: AC
  Administered 2020-04-24: 200 mg via INTRAVENOUS
  Filled 2020-04-24: qty 200

## 2020-04-24 MED ORDER — SODIUM CHLORIDE 0.9 % IV SOLN
100.0000 mg | INTRAVENOUS | Status: DC
  Administered 2020-04-25 – 2020-04-27 (×3): 100 mg via INTRAVENOUS
  Filled 2020-04-24 (×3): qty 100

## 2020-04-24 NOTE — Progress Notes (Signed)
PROGRESS NOTE    Martha Ortega  AST:[id number] DOB: December 26, 1934 DOA: 04/07/2020 PCP: [name], MD    Chief Complaint  Patient presents with  . Blood In Stools  . Abdominal Pain    Brief Narrative:  Patient is 85 year old female with history of nonhemorrhagic CVA, CKD stage IV, hypertension, hyperlipidemia, diverticulitis, status post appendicectomy and hysterectomy who presented to the emergency room with 2 weeks history of intermittent left lower quadrant pain, blood in the stool.  She was started on Augmentin as an outpatient for presumed diverticulitis by her PCP but it did not help.  On presentation, she was tachycardic and tachypneic, had leukocytosis, lactic acidosis.  Imaging showed recurrent descending colon diverticulitis complicated by sinus tract and collection in the lower paracolic catheter.  She was started on IV antibiotics, GI and general surgery consulted.  She had a prolonged hospital course was persistent abdominal pain, no bowel movement.    Assessment & Plan:   Principal Problem:   Severe sepsis with septic shock (HCC) Active Problems:   Postoperative hypovolemic shock   Pneumoperitoneum   Hyperlipidemia   CKD (chronic kidney disease), stage IV (HCC)   Diverticulitis of sigmoid colon with abscess   Aortic atherosclerosis (HCC)   Blood in stool   ABLA (acute blood loss anemia)   Pericolonic abscess due to diverticulitis   Large bowel obstruction (HCC)   Cerebrovascular accident (CVA) ([place])   Hypomagnesemia   Hypokalemia   Sepsis ([place])   Renal failure (ARF), acute on chronic (HCC)   Elevated troponin   Ureter injury  1 severe sepsis with septic shock and hypovolemic shock Patient noted postop to go into septic shock and hypovolemic shock with minimal urine output, elevated lactic acid levels which are trended down.  Patient noted to be tachycardic with a leukocytosis.  Patient with poor oral intake.  Patient pancultured results pending.  Repeat  CT abdomen and pelvis done concerning for pneumoperitoneum which per general surgery is expected.  Patient received 2 to 3 L bolus however still hypotensive and subsequently started on Levophed drip.  Blood pressure improving on pressors.  Patient with a worsening procalcitonin levels.  Status post IV albumin.  Leukocytosis is worsening.  Currently on IV Zosyn.  IV Eraxis added this morning per PCCM.  PCCM following and appreciate input and recommendations.   2.  Pneumoperitoneum Per general surgery suspected postop changes.  Per general surgery.  3. sepsis likely secondary to recurrent diverticulitis/sigmoid stricture, POA Patient met criteria for sepsis with tachycardia, tachypnea, leukocytosis.  Patient pancultured with cultures pending.  Patient with a worsening procalcitonin level.  Worsening leukocytosis.  On IV Zosyn.  PCCM following, IV Eraxis added to regimen.  General surgery following.  4.  Recurrent diverticulitis with sigmoid stricture and bowel obstruction Complicated by sinus tract and fluid collections in the lower paracolic gutter.  Repeat CT abdomen and pelvis done April 18, 2020 with sigmoid diverticulosis with marked wall thickening and pericolic inflammatory changes of the proximal sigmoid colon, no evidence of abscess or free intraperitoneal perforation.  Leukocytosis trended down.  Patient noted not to have had a bowel movement in several days despite MiraLAX and suppository.  Patient underwent abdominal films with residual contrast within the colon proximal to the sigmoid reflecting a degree of persistent obstruction, distention of the cecum to 9.7 cm.  Patient seen by general surgery this morning was taken to the OR where she underwent total abdominal colectomy with end ileostomy, repair of stomach injury secondary to  sigmoid stricture, dilated proximal colon with contained perforation per Dr. Kieth Brightly (04/10/2020).  Patient on IV Zosyn.  Patient currently in stepdown unit.   Patient noted to have developed shock the night of 04/04/2020, currently requiring pressors with elevated procalcitonin levels.  Procalcitonin levels are rising.  Leukocytosis worsening.  Continue IV Zosyn.  IV Eraxis added per PCCM for antifungal coverage.  Repeat cultures pending.  Per general surgery.   5.  Entrapment of left ureter Patient noted to have possible left ureteral injury with entrapment of left ureter.  Urology was consulted during surgery and patient underwent left ureteral exploration, left ureterolysis per Dr. Diona Fanti 04/23/2020.  Per urology.  6.  Hypokalemia/hypomagnesemia Electrolytes repleted.  Potassium of 4.3.  Magnesium at 3.1.  Follow.  7.  Normocytic anemia/suspected GI bleed Hemoglobin noted to be in the range of 7 which has remained stable.  Anemia panel done with low iron patient status post IV iron infusion.  Patient noted to have had hematochezia at home likely associated with acute recurrent diverticulitis.  GI was consulted during this hospitalization and no plans for intervention at this time.  Transfusion threshold hemoglobin <7.  Hemoglobin currently at 8.3.  Outpatient follow-up with GI.  8.  Hypertension Patient noted to be hypotensive during his hospitalization currently requiring pressors.  Currently n.p.o.  Continue to hold antihypertensive medications.  IV metoprolol discontinued per cardiology recommendations due to hypotension.  Follow.    9.  History of nonhemorrhagic CVA Noted to be on aspirin and statin at home prior to admission.  Aspirin on hold due to concern for possible GI bleed.  Patient currently n.p.o. statin on hold and likely resume when tolerating oral intake.   10.  Acute on chronic kidney disease stage IV/acidosis Patient with worsening renal function with creatinine now up to 3.48 from  2.54 from 2.09 on admission.  Baseline creatinine 1.7-2.  Likely a prerenal azotemia as patient noted to have developed shock postoperatively  requiring pressors.  Urinalysis protein negative, nitrite negative, leukocytes negative with a specific gravity of 1.019.  Urine sodium < 10.  Urine creatinine 122.11.  Renal ultrasound negative for hydronephrosis.  Patient with a urine output of 370 cc over the past 24 hours.  Patient seen in consultation by nephrology who feel acute kidney injury likely due to postop shock/hypoperfusion.  Per nephrology patient with > 10kg up in weight.  IV fluids decreased to 40 cc/h.  Cozaar on hold.  Appreciate nephrology input and recommendations.   11.  Elevated troponin Likely demand ischemia postoperatively as patient noted to have developed shock.  Patient denies any acute chest pain.  EKG done with a sinus tachycardia.  2D echo done preoperatively with 60 to 65%, no wall motion abnormalities, grade 1 diastolic dysfunction.  As patient developed shock.  Patient seen in consultation by cardiology who are in agreement that likely demand ischemia.  Cardiology recommended discontinuation of IV metoprolol and do not recommend starting IV heparin.  Cardiology following.      DVT prophylaxis: SCDs Code Status: Full Family Communication: Updated patient and son at bedside. Disposition:   Status is: Inpatient    Dispo: The patient is from: Home              Anticipated d/c is to: To be determined              Anticipated d/c date is: To be determined to              Patient currently  postop, noted to develop hypotension and shock, currently in stepdown unit, on IV pressors, worsening renal function, worsening infection markers.  Not stable for discharge.        Consultants:   Gastroenterology: Dr. Havery Moros 04/16/2020  General surgery: Dr. Redmond Pulling 04/18/2020  PCCM: Dr. Silas Flood 04/23/2020  Cardiology: Dr. Harrell Gave 04/23/2020  Nephrology: Dr.Schertz 04/23/2020  Procedures:   CT abdomen and pelvis 04/12/2020, 04/18/2020, 04/23/2020  Abdominal films 03/27/2020  2 D ECHO  04/21/2020  Total abd colectomy with end ileostomy, repair of stomach injury --per Dr Kieth Brightly 03/27/2020  Left ureteral exploration and left ureterolysis --Per urology Dr Diona Fanti 04/08/2020  Renal ultrasound 04/23/2020  Antimicrobials:   IV Zosyn 03/27/2020>>>>  IV Eraxis 04/24/2020>>>>   Subjective: Patient laying in bed.  States overall feeling better.  NG tube in place.  Denies any shortness of breath.  Denies any chest pain.  Son at bedside.    Objective: Vitals:   04/24/20 0400 04/24/20 0500 04/24/20 0700 04/24/20 0800  BP: (!) 110/51 110/67 (!) 117/54 118/63  Pulse: (!) 112 (!) 112 (!) 118 (!) 120  Resp: 15 18 17  (!) 23  Temp:    98.3 F (36.8 C)  TempSrc:    Axillary  SpO2: 90% 92% 91% 98%  Weight:  69.3 kg    Height:        Intake/Output Summary (Last 24 hours) at 04/24/2020 0956 Last data filed at 04/24/2020 0618 Gross per 24 hour  Intake 1200.39 ml  Output 1185 ml  Net 15.39 ml   Filed Weights   04/03/2020 1333 04/23/20 1036 04/24/20 0500  Weight: 59.9 kg 67.9 kg 69.3 kg    Examination:  General exam: In stepdown unit.  Alert.  NG tube in place. Respiratory system: CTA B anterior lung fields.  No wheezes, no crackles, no rhonchi.  Normal respiratory effort.   Cardiovascular system: Tachycardia.  No JVD.  No murmurs rubs or gallops.  Trace to 1+ bilateral lower extremity edema. Gastrointestinal system: Abdomen is soft, nondistended, JP drain in place, and ileostomy with minimal bilious fluid.  Hypoactive bowel sounds. Central nervous system: Alert.  Moving extremities spontaneously.  No focal neurological deficits.  Extremities: Symmetric 5 x 5 power. Skin: No rashes, lesions or ulcers Psychiatry: Judgement and insight fair.  Mood and affect appropriate.    Data Reviewed: I have personally reviewed following labs and imaging studies  CBC: Recent Labs  Lab 04/21/20 0549 04/16/2020 0442 04/09/2020 2234 04/23/20 0251 04/24/20 0231  WBC 11.6*  14.2* 8.4 12.3* 17.9*  NEUTROABS 9.4* 12.7* 7.4 11.2* 16.1*  HGB 7.2* 7.7* 9.5* 9.6* 8.3*  HCT 22.0* 23.6* 28.5* 28.5* 25.3*  MCV 83.7 81.9 87.4 86.1 86.6  PLT 430* 514* 411* 399 341    Basic Metabolic Panel: Recent Labs  Lab 04/19/20 0505 04/20/20 0417 04/21/20 0549 04/05/2020 0442 03/28/2020 2234 04/23/20 0251 04/24/20 0231  NA 140 139 139 133* 137 135 137  K 4.2 3.9 4.4 4.3 4.1 4.1 4.3  CL 110 109 108 104 109 106 101  CO2 20* 19* 19* 18* 17* 17* 20*  GLUCOSE 109* 104* 102* 142* 99 95 107*  BUN 13 15 19  25* 24* 25* 32*  CREATININE 2.04* 1.96* 2.03* 2.50* 2.53* 2.54* 3.48*  CALCIUM 8.4* 8.2* 8.3* 8.1* 7.4* 7.5* 7.3*  MG 1.7 1.8  --   --   --  1.6* 3.1*  PHOS  --   --   --   --   --  3.9 4.5  GFR: Estimated Creatinine Clearance: 11.2 mL/min (A) (by C-G formula based on SCr of 3.48 mg/dL (H)).  Liver Function Tests: Recent Labs  Lab 04/12/2020 2234 04/23/20 0251 04/24/20 0231  AST 33  --   --   ALT 14  --   --   ALKPHOS 30*  --   --   BILITOT 0.9  --   --   PROT 4.0*  --   --   ALBUMIN 1.8* 2.4* 2.0*    CBG: No results for input(s): GLUCAP in the last 168 hours.   Recent Results (from the past 240 hour(s))  MRSA PCR Screening     Status: None   Collection Time: 03/25/2020  7:50 PM   Specimen: Nasopharyngeal  Result Value Ref Range Status   MRSA by PCR NEGATIVE NEGATIVE Final    Comment:        The GeneXpert MRSA Assay (FDA approved for NASAL specimens only), is one component of a comprehensive MRSA colonization surveillance program. It is not intended to diagnose MRSA infection nor to guide or monitor treatment for MRSA infections. Performed at [facility], [place] [address], [place], [place] [address]   Culture, Urine     Status: None   Collection Time: 04/13/2020  9:48 PM   Specimen: Urine, Catheterized  Result Value Ref Range Status   Specimen Description   Final    URINE, CATHETERIZED Performed at [facility] [address], [place], [place] [address]    Special Requests   Final    NONE Performed at [facility], [place] [address], [place], [place] [address]    Culture   Final    NO GROWTH Performed at [facility], [place] [address], [place], [place] [address]    Report Status 04/24/2020 FINAL  Final  Culture, blood (routine x 2)     Status: None (Preliminary result)   Collection Time: 03/27/2020 10:34 PM   Specimen: BLOOD  Result Value Ref Range Status   Specimen Description   Final    BLOOD BLOOD RIGHT ARM Performed at [facility] [address] [place], [place] [address]    Special Requests   Final    BOTTLES DRAWN AEROBIC AND ANAEROBIC Blood Culture adequate volume Performed at [facility] [address] [place], [place] [address]    Culture   Final    NO GROWTH 1 DAY Performed at [facility], [place] [address] [place], [place] [address]    Report Status PENDING  Incomplete  Culture, blood (routine x 2)     Status: None (Preliminary result)   Collection Time: 04/03/2020 10:34 PM   Specimen: BLOOD  Result Value Ref Range Status   Specimen Description   Final    BLOOD BLOOD LEFT HAND Performed at [facility] [address] [place], [place] [address]    Special Requests   Final    BOTTLES DRAWN AEROBIC ONLY Blood Culture adequate volume Performed at [facility] [address] [place], [place] [address]    Culture   Final    NO GROWTH 1 DAY Performed at [facility], [place] [address] [place], [place] [address]    Report Status PENDING  Incomplete         Radiology Studies: CT ABDOMEN PELVIS WO CONTRAST  Result Date: 04/23/2020 CLINICAL DATA:  Elevated lactate it acid and hypotension. Status post total colectomy. EXAM: CT  ABDOMEN AND PELVIS WITHOUT CONTRAST TECHNIQUE: Multidetector CT imaging of the abdomen and pelvis was performed following the standard  protocol without IV contrast. COMPARISON:  CT abdomen pelvis 04/18/2020 FINDINGS: LOWER CHEST: Intermediate sized right and small left pleural effusion. Right basilar consolidation. HEPATOBILIARY: Multiple hepatic cysts are unchanged. Distended gallbladder without biliary dilatation. PANCREAS: Normal pancreas. No ductal dilatation or peripancreatic fluid collection. SPLEEN: Normal. ADRENALS/URINARY TRACT: The adrenal glands are normal. No hydronephrosis, nephroureterolithiasis or solid renal mass. The urinary bladder is normal for degree of distention STOMACH/BOWEL: Status post recent total colectomy. There is multifocal pneumoperitoneum throughout the abdomen. The greatest concentration of free air is in the right upper quadrant. Residual rectum is unremarkable. There is a right-sided ileostomy. No small bowel dilatation. There is mild mesenteric edema and free fluid within the abdomen. VASCULAR/LYMPHATIC: There is calcific atherosclerosis of the abdominal aorta. No lymphadenopathy. REPRODUCTIVE: Status post hysterectomy. No adnexal mass. MUSCULOSKELETAL. No bony spinal canal stenosis or focal osseous abnormality. OTHER: Packing material in ventral abdominal incisions. Gas tracking along the tissue superficial to the right greater than left anterior abdominal wall. IMPRESSION: 1. Status post recent total colectomy with multifocal pneumoperitoneum throughout the abdomen. The greatest concentration of free air is in the right upper quadrant. This is favored to be post-operative. 2. Dilated gallbladder is nonspecific in this setting. 3. Intermediate sized right and small left pleural effusions with right basilar consolidation. Aortic Atherosclerosis (ICD10-I70.0). Electronically Signed   By: Ulyses Jarred M.D.   On: 04/23/2020 00:17   US RENAL  Result Date: 04/23/2020 CLINICAL DATA:  85 year old female status post total colectomy. Query hydronephrosis, ureteral trauma. EXAM: RENAL / URINARY TRACT ULTRASOUND  COMPLETE COMPARISON:  CT Abdomen and Pelvis 04/04/2020 and earlier. FINDINGS: Right Kidney: Renal measurements: 8.1 x 4.7 x 5.7 cm = volume: 114 mL. Echogenic right renal cortex (image 2). No right hydronephrosis or renal lesion. Left Kidney: Renal measurements: 8.0 x 4.1 x 4.1 cm = volume: 69 mL. Left renal cortex also appears increased in echogenicity relative to the spleen (image 14), similar to the right. No left hydronephrosis or renal lesion. Bladder: Decompressed, Foley catheter balloon visible. Other: None. IMPRESSION: Echogenic kidneys consistent with chronic medical renal disease. No hydronephrosis. Electronically Signed   By: Genevie Ann M.D.   On: 04/23/2020 08:11   DG CHEST PORT 1 VIEW  Result Date: 04/10/2020 CLINICAL DATA:  Hypotension EXAM: PORTABLE CHEST 1 VIEW COMPARISON:  04/21/2020, 04/18/2020 FINDINGS: Single frontal view of the chest demonstrates enteric catheter passing below diaphragm, tip excluded by collimation. The side port projects over the gastric body. Cardiac silhouette is unremarkable. There is elevation of the right hemidiaphragm. Rounded lucencies projecting over the right upper quadrant do not correspond to any loops of bowel on preceding CT exam. Given findings on prior CT study, I cannot exclude pneumoperitoneum. Repeat CT or left lateral decubitus radiographs of the abdomen may be useful. Patchy areas of consolidation at the lung bases likely reflect atelectasis. Small right effusion is suspected. No pneumothorax. IMPRESSION: 1. Rounded gas lucencies projecting over right upper quadrant, suspicious for pneumoperitoneum. Repeat CT of the abdomen and pelvis may be useful. 2. Bibasilar consolidation, likely atelectasis. Trace right pleural effusion. 3. Enteric catheter as above. Critical Value/emergent results were called by telephone at the time of interpretation on 04/02/2020 at 10:51 pm to provider [facility] , who verbally acknowledged these results. Electronically Signed    By: Randa Ngo M.D.   On: 04/07/2020 22:55  Scheduled Meds: . bisacodyl  10 mg Rectal Daily  . chlorhexidine  15 mL Mouth Rinse BID  . Chlorhexidine Gluconate Cloth  6 each Topical Daily  . lip balm  1 application Topical BID  . mouth rinse  15 mL Mouth Rinse q12n4p   Continuous Infusions: . sodium chloride 10 mL/hr at 04/24/20 0000  . famotidine (PEPCID) IV 20 mg (04/24/20 0931)  . methocarbamol (ROBAXIN) IV Stopped (04/07/2020 2300)  . norepinephrine (LEVOPHED) Adult infusion Stopped (04/24/20 0157)  . piperacillin-tazobactam Stopped (04/24/20 0520)  .  sodium bicarbonate (isotonic) infusion in sterile water 40 mL/hr at 04/24/20 8184     LOS: 10 days    Time spent: 40 minutes    Irine Seal, MD [facility]   To contact the attending provider between 7A-7P or the covering provider during after hours 7P-7A, please log into the web site [email or web address] and access using universal [place] password for that web site. If you do not have the password, please call the hospital operator.  04/24/2020, 9:56 AM

## 2020-04-24 NOTE — Progress Notes (Signed)
Patient sat on side of bed for about 15 minutes. She is trying with IS but is only at about 250 to 275. Continuing to encourage to breathe deep and cough. Levophed has been titrated off. So far BP is holding.

## 2020-04-24 NOTE — Progress Notes (Signed)
Nutrition Follow-up  RD working remotely.  DOCUMENTATION CODES:   Not applicable  INTERVENTION:  - TPN initiation and advancement per Pharmacist  NUTRITION DIAGNOSIS:   Increased nutrient needs related to acute illness (diverticulitis) as evidenced by estimated needs. -ongoing  GOAL:   Patient will meet greater than or equal to 90% of their needs -unmet/unable to meet at this time.   MONITOR:   Diet advancement,Labs,Weight trends,I & O's,Other (Comment) (TPN regimen)  REASON FOR ASSESSMENT:   Consult New TPN/TNA  ASSESSMENT:   85 year old female with past medical history of CVA on aspirin, CKD 4, hypertension, hyperlipidemia, diverticulitis, s/p appendectomy and hysterectomy who presented to the ED with 2 weeks of intermittent LLQ pain and blood in stool since this morning.  Diet changed from Soft to CLD on 12/29 at 0757 and to NPO on 12/29 at 0937; patient has remained NPO since that time. NGT placed in L nare on 12/29 and is to LIS.   Patient is POD #2 total colectomy with end ileostomy, repair of stomach injury. Also POD #2 for L ureteral exploration, L ureterolysis.   Weight has been trending up since admission on 12/21. Weight is +21 lb since that date. No information documented in the edema section of flow sheet.   Pending PICC/cental line placement for TPN initiation. Estimated nutrition needs have been updated. Will communicate this to Pharmacist.    Labs reviewed; BUN: 32 mg/dl, creatinine: 3.48 mg/dl, Ca: 7.3 mg/dl, Mg: 3.1 mg/dl, GFR: 13 ml/min.  Medications reviewed; 10 mg rectal dulcolax/day, 20 mg IV pepcid/day, sliding scale novolog.  IVF; LR @ 75 ml/hr.    NUTRITION - FOCUSED PHYSICAL EXAM:  unable to complete at this time.   Diet Order:   Diet Order            Diet NPO time specified Except for: Sips with Meds  Diet effective now                 EDUCATION NEEDS:   Education needs have been addressed  Skin:  Skin Assessment: Skin  Integrity Issues: Skin Integrity Issues:: Incisions Incisions: abdomen (12/29)  Last BM:  12/31 (20 ml from colostomy)  Height:   Ht Readings from Last 1 Encounters:  03/28/2020 5\' 2"  (1.575 m)    Weight:   Wt Readings from Last 1 Encounters:  04/24/20 69.3 kg    Estimated Nutritional Needs:  Kcal:  1700-1950 kcal Protein:  90-105 grams Fluid:  >/= 1.5 L/day      Jarome Matin, MS, RD, LDN, CNSC Inpatient Clinical Dietitian RD pager # available in [place]  After hours/weekend pager # available in [facility]

## 2020-04-24 NOTE — Progress Notes (Signed)
PHARMACY - TOTAL PARENTERAL NUTRITION CONSULT NOTE   Indication: prolonged ileus  Patient Measurements: Height: 5\' 2"  (157.5 cm) Weight: 69.3 kg (152 lb 12.5 oz) IBW/kg (Calculated) : 50.1 TPN AdjBW (KG): 59.9 Body mass index is 27.94 kg/m.  Assessment:  85 y/o F with a h/o CKD IV admitted with sigmoid stricture s/p colectomy with end ileostomy. Surgery expects protracted ileus in addition to the fact that patient has gone many days with poor appetite even prior to admission. Pharmacy consulted to initiate TPN.  Glucose / Insulin: CBGs stable/ no insulin while NPO Electrolytes: Lytes WNL except mg elevated Renal: SCr elevated and rising LFTs / TGs: pending in AM Prealbumin / albumin: pending/ 2 Intake / Output; MIVF: NPO/ oliguric; bicarb @ 40 ml/hr GI Imaging:  CT abdomen pelvis 12/30: 1. Status post recent total colectomy with multifocal pneumoperitoneum throughout the abdomen. The greatest concentration of free air is in the right upper quadrant. This is favored to be post-operative. 2. Dilated gallbladder is nonspecific in this setting. 3. Intermediate sized right and small left pleural effusions with right basilar consolidation. Surgeries / Procedures: 12/29 colectomy with end ileostomy  Central access: pending TPN start date: 12/31 tentatively  Nutritional Goals (per RD recommendation on pending): kCal: , Protein: , Fluid:  Goal TPN rate is  mL/hr (provides  g of protein and  kcals per day)  Current Nutrition:  NPO  Plan:  Attempting to obtain central access Start TPN at 40 mL/hr at 1800 to provide 960 ml with 936 kcal and 43.2 g protein Increase Na in TPN to provide enough volume for TPN compounder Electrolytes in TPN: 57mEq/L of Na, 39mEq/L of K, 55mEq/L of Ca, 75mEq/L of Mg, and 74mmol/L of Phos. Cl:Ac ratio 1:1 Add standard MVI and trace elements to TPN Initiate very sensitive q4h SSI and adjust as needed  Continue bicarb at 40 ml/hr per nephrology and assess  need for continued bicarb 1/1 Baseline labs in AM  Monitor TPN labs on Mon/Thurs  Ulice Dash D 04/24/2020,12:18 PM

## 2020-04-24 NOTE — Procedures (Signed)
Central Venous Catheter Insertion Procedure Note  TAHTIANA ROZIER  [id number]  05/28/1934  Date:04/24/20  Time:4:00 PM   Provider Performing:Shona Pardo A Diany Formosa   Procedure: Insertion of Non-tunneled Central Venous [phone]) with US guidance (25366)   Indication(s) Medication administration  Consent Risks of the procedure as well as the alternatives and risks of each were explained to the patient and/or caregiver.  Consent for the procedure was obtained and is signed in the bedside chart  Anesthesia Topical only with 1% lidocaine   Timeout Verified patient identification, verified procedure, site/side was marked, verified correct patient position, special equipment/implants available, medications/allergies/relevant history reviewed, required imaging and test results available.  Sterile Technique Maximal sterile technique including full sterile barrier drape, hand hygiene, sterile gown, sterile gloves, mask, hair covering, sterile ultrasound probe cover (if used).  Procedure Description Area of catheter insertion was cleaned with chlorhexidine and draped in sterile fashion.  With real-time ultrasound guidance a central venous catheter was placed into the right subclavian vein. Nonpulsatile blood flow and easy flushing noted in all ports.  The catheter was sutured in place and sterile dressing applied.  Complications/Tolerance None; patient tolerated the procedure well. Chest X-ray is ordered to verify placement for internal jugular or subclavian cannulation.   Chest x-ray is not ordered for femoral cannulation.  EBL Minimal  Specimen(s) None

## 2020-04-24 NOTE — Progress Notes (Signed)
Pt converted to afib RVR while working w/ telemtry. EKG obtained, 2.5 mg metoprolol given w/ minimal effect. Dr. Grandville Silos, Dr. Ander Slade, and cardiology made aware. Pt became hypotensive following metoprolol administration, necessitating levophed be restarted.

## 2020-04-24 NOTE — Progress Notes (Addendum)
Pharmacy Antibiotic Note  Martha Ortega is a 85 y.o. female admitted on 03/30/2020 with bowel obstruction from sigmoid stricture that failed medical management now s/p colectomy and end ileostomy. Patient is currently on Zosyn and Eraxis for intra-absdominal infection. Pharmacy has been consulted for empiric vancomycin dosing in the setting of worsening leukocytosis and PCT.   Plan: Vancomycin 1500 mg iv once.   Will follow renal function closely and dose by levels at this point in the setting of acute on chronic renal failure. Will f/u culture results and plans for antibiotics.   Height: 5\' 2"  (157.5 cm) Weight: 69.3 kg (152 lb 12.5 oz) IBW/kg (Calculated) : 50.1  Temp (24hrs), Avg:97.8 F (36.6 C), Min:97.5 F (36.4 C), Max:98.3 F (36.8 C)  Recent Labs  Lab 04/18/20 0509 04/19/20 0505 04/21/20 0549 04/13/2020 0442 04/24/2020 2234 04/23/20 0023 04/23/20 0251 04/24/20 0231  WBC 10.2   < > 11.6* 14.2* 8.4  --  12.3* 17.9*  CREATININE 1.86*   < > 2.03* 2.50* 2.53*  --  2.54* 3.48*  LATICACIDVEN 1.2  --   --   --  2.5* 1.8  --   --    < > = values in this interval not displayed.    Estimated Creatinine Clearance: 11.2 mL/min (A) (by C-G formula based on SCr of 3.48 mg/dL (H)).    Allergies  Allergen Reactions  . Amlodipine Swelling    Throat and mouth swelling  . Fluticasone Propionate     REACTION: does not help  . Loratadine     REACTION: does not work  . Lovastatin     REACTION: was not effective  . Omeprazole Swelling    Throat, lips, and mouth swelling  . Zocor [Simvastatin - High Dose]     Mouth and throat swelled    Zosyn 12/21>> Vancomycin 12/31 >> Eraxis 12/31 >>  12/29 BCx: ngtd 12/29 UCx: ngf 12/29 MRSA PCR: neg  Thank you for allowing pharmacy to be a part of this patient's care.  Ulice Dash D 04/24/2020 11:25 AM

## 2020-04-24 NOTE — Consult Note (Addendum)
[place] Nurse ostomy consult note Pt had ileostomy surgery performed on 12/29.  She is feeling poorly in ICU with an NG today; not ready for teaching and no family are present during the first post-op pouch change. Stoma type/location: Stoma is red and viable, slightly above skin level, 1 3/4 inches. Stoma is located in close proximity to full thickness post-op midline abd wound and it might be difficult to maintain a seal.  Peristomal assessment: intact skin surrounding Output: 20 cc of liquid brown stool with a small piece of formed stool Ostomy pouching: 1pc.  Education provided:  Pt states she lives with her son prior to admission. She did not watch the procedure or ask questions.   Applied one piece convex pouch Kellie Simmering # [phone]) and a barrier ring([place] [phone]) to attempt to maintain a seal.  WOC team will continue to follow and begin teaching sessions with pt and family when she is out of ICU.  2 extra barrier rings and pouches left at the bedside for staff nurses use.  Enrolled patient in [facility] program: NOT YET. Julien Girt MSN, RN, [place], [place], [place]

## 2020-04-24 NOTE — Progress Notes (Signed)
NAME:  Martha Ortega, MRN:  [id number], DOB:  1934/07/15, LOS: 60 ADMISSION DATE:  04/21/2020, CONSULTATION DATE: 04/13/2020 REFERRING MD: Dr. Irine Seal, CHIEF COMPLAINT: Abdominal pain  Brief History:  85 year old with chronic diverticulitis with bowel obstruction from sigmoid stricture that failed medical management s/p ex lap, total colectomy, end ileostomy 12/29 who is hypotensive post-op.   History of Present Illness:  Admitted 12/21 with LLQ pain and bloody stool. CT abdomen/pelivs on admission with diverticulitis of the distal descending colon complicated by a sinus tract and 18 mm collection in the low pericolic gutter. Managed conservatively with NG tube. Liquid diet, IVF. Repeat CT AP 12/25 appeared mildly worse with wall thickening, no frank perforation. Was not taking PO so taken to OR 12/29 for surgery. Now hypotensive despite IVF boluses.    Past Medical History:  Hypertension Diverticulitis  Significant Hospital Events:  Exploratory laparotomy 12/29  Consults:  General surgery PCCM  Procedures:  Ex lap, total colectomy and end ileostomy 12/29  Significant Diagnostic Tests:  CT abdomen 12/29  Micro Data:  12/2p UCx, BcX x 2 pending  Antimicrobials:  Zosyn 12/26>> Eraxis 12/31>> Vancomycin 12/31>>  Interim History / Subjective:  No overnight events Comfortable but still not able to take orally  Objective   Blood pressure (!) 110/48, pulse (!) 117, temperature 98.4 F (36.9 C), temperature source Axillary, resp. rate 19, height 5\' 2"  (1.575 m), weight 69.3 kg, SpO2 98 %.        Intake/Output Summary (Last 24 hours) at 04/24/2020 1410 Last data filed at 04/24/2020 1110 Gross per 24 hour  Intake 966.28 ml  Output 905 ml  Net 61.28 ml   Filed Weights   03/28/2020 1333 04/23/20 1036 04/24/20 0500  Weight: 59.9 kg 67.9 kg 69.3 kg    Examination: General: Frail, elderly, comfortable HENT: Moist oral mucosa Lungs: Decreased air movement but  clear Cardiovascular: S1-S2 appreciated Abdomen: Postsurgical changes, ostomy present Extremities: Warm and dry Neuro: Alert, moves all extremities, able to stand GU: Fair output  Resolved Hospital Problem list     Assessment & Plan:  Septic shock Hypovolemic shock -Secondary to intra-abdominal sepsis -Was able to wean off pressors -Continue antibiotics -Remains afebrile but leukocytosis did worsen -Added antifungal and vancomycin  Pneumoperitoneum -Postoperative changes -Continue Zosyn, added vancomycin and antifungal  Acute kidney injury -Maintain normotension -Avoid nephrotoxic's -Trend electrolytes -BUN/creatinine did trend a little higher -Continue fluid resuscitation  Bowel obstruction due to sigmoid stricture and contained perforation History of diverticulitis -Ileus -Ileus is expected to up with the prolonged -Will start TPN -Will need a central venous access  History of hypertension -Hold off on blood pressure medications at present  Acute blood loss anemia -Transfuse per protocol   Best practice (evaluated daily)  Diet: N.p.o. Pain/Anxiety/Delirium protocol (if indicated): Tylenol, morphine as needed VAP protocol (if indicated): Not indicated DVT prophylaxis: SCD GI prophylaxis: Pepcid Glucose control: SSI Mobility: Bedrest Disposition: ICU  Goals of Care:  Last date of multidisciplinary goals of care discussion: Family and staff present: Family member at bedside Summary of discussion:  Follow up goals of care discussion due:  Code Status: Full code  Labs   CBC: Recent Labs  Lab 04/21/20 0549 04/20/2020 0442 04/07/2020 2234 04/23/20 0251 04/24/20 0231  WBC 11.6* 14.2* 8.4 12.3* 17.9*  NEUTROABS 9.4* 12.7* 7.4 11.2* 16.1*  HGB 7.2* 7.7* 9.5* 9.6* 8.3*  HCT 22.0* 23.6* 28.5* 28.5* 25.3*  MCV 83.7 81.9 87.4 86.1 86.6  PLT 430* 514* 411*  399 283    Basic Metabolic Panel: Recent Labs  Lab 04/19/20 0505 04/20/20 0417 04/21/20 0549  04/08/2020 0442 04/09/2020 2234 04/23/20 0251 04/24/20 0231  NA 140 139 139 133* 137 135 137  K 4.2 3.9 4.4 4.3 4.1 4.1 4.3  CL 110 109 108 104 109 106 101  CO2 20* 19* 19* 18* 17* 17* 20*  GLUCOSE 109* 104* 102* 142* 99 95 107*  BUN 13 15 19  25* 24* 25* 32*  CREATININE 2.04* 1.96* 2.03* 2.50* 2.53* 2.54* 3.48*  CALCIUM 8.4* 8.2* 8.3* 8.1* 7.4* 7.5* 7.3*  MG 1.7 1.8  --   --   --  1.6* 3.1*  PHOS  --   --   --   --   --  3.9 4.5   GFR: Estimated Creatinine Clearance: 11.2 mL/min (A) (by C-G formula based on SCr of 3.48 mg/dL (H)). Recent Labs  Lab 04/18/20 0509 04/19/20 0505 04/21/2020 0442 03/29/2020 2234 04/23/20 0023 04/23/20 0251 04/24/20 0231  PROCALCITON  --   --   --  33.28  --  38.94 51.24  WBC 10.2   < > 14.2* 8.4  --  12.3* 17.9*  LATICACIDVEN 1.2  --   --  2.5* 1.8  --   --    < > = values in this interval not displayed.    Liver Function Tests: Recent Labs  Lab 04/10/2020 2234 04/23/20 0251 04/24/20 0231  AST 33  --   --   ALT 14  --   --   ALKPHOS 30*  --   --   BILITOT 0.9  --   --   PROT 4.0*  --   --   ALBUMIN 1.8* 2.4* 2.0*   No results for input(s): LIPASE, AMYLASE in the last 168 hours. No results for input(s): AMMONIA in the last 168 hours.  ABG No results found for: PHART, PCO2ART, PO2ART, HCO3, TCO2, ACIDBASEDEF, O2SAT   Coagulation Profile: No results for input(s): INR, PROTIME in the last 168 hours.  Cardiac Enzymes: No results for input(s): CKTOTAL, CKMB, CKMBINDEX, TROPONINI in the last 168 hours.  HbA1C: Hgb A1c MFr Bld  Date/Time Value Ref Range Status  04/23/2020 02:51 AM 5.2 4.8 - 5.6 % Final    Comment:    (NOTE) Pre diabetes:          5.7%-6.4%  Diabetes:              >6.4%  Glycemic control for   <7.0% adults with diabetes   11/25/2010 09:33 AM 5.5 4.6 - 6.5 % Final    Comment:    Glycemic Control Guidelines for People with Diabetes:Non Diabetic:  <6%Goal of Therapy: <7%Additional Action Suggested:  >8%     CBG: No  results for input(s): GLUCAP in the last 168 hours.  Review of Systems:   Some abdominal discomfort  Past Medical History:  She,  has a past medical history of Abnormal blood findings, Allergic rhinitis, History of diverticulitis of colon, Hyperglycemia (09), Hyperlipidemia, Hypertension, and Stroke (cerebrum) ([place]).   Surgical History:   Past Surgical History:  Procedure Laterality Date  . ABDOMINAL HYSTERECTOMY    . APPENDECTOMY     Patient states not sure if this is true  . barium swallow  10/18/03  . CARDIOVASCULAR STRESS TEST  07/21/04  . COLON RESECTION N/A 04/04/2020   Procedure: TOTAL ABDOMINAL COLECTOMY WITH ILEOSTOMY REPAIR OF STOMACH INJURY;  Surgeon: Kieth Brightly, Arta Bruce, MD;  Location: [facility];  Service: General;  Laterality: N/A;  . CYSTECTOMY  93   R axilla  . CYSTECTOMY  97   R breast     Social History:   reports that she quit smoking about 42 years ago. She has never used smokeless tobacco. She reports that she does not drink alcohol and does not use drugs.   Family History:  Her family history includes Arthritis in her mother and sister; Diabetes in her sister; Hypertension in her father; Lupus in her sister; Prostate cancer in her brother; Stroke in her father.   Allergies Allergies  Allergen Reactions  . Amlodipine Swelling    Throat and mouth swelling  . Fluticasone Propionate     REACTION: does not help  . Loratadine     REACTION: does not work  . Lovastatin     REACTION: was not effective  . Omeprazole Swelling    Throat, lips, and mouth swelling  . Zocor [Simvastatin - High Dose]     Mouth and throat swelled     Home Medications  Prior to Admission medications   Medication Sig Start Date End Date Taking? Authorizing Provider  aspirin 81 MG tablet Take 81 mg by mouth daily.   Yes [provider]  Calcium Carbonate-Vit D-Min (CALCIUM 1200 PO) Take 1 tablet by mouth daily.   Yes [provider]  hydrALAZINE (APRESOLINE) 50 MG  tablet Take 50 mg by mouth in the morning and at bedtime. 11/08/19  Yes [provider]  losartan (COZAAR) 50 MG tablet Take 50 mg by mouth daily. 02/11/20  Yes [provider]  metoprolol succinate (TOPROL-XL) 50 MG 24 hr tablet TAKE 1 TABLET DAILY WITH OR IMMEDIATELY FOLLOWING A MEAL 10/18/19  Yes Tower, Marne A, MD  rosuvastatin (CRESTOR) 20 MG tablet TAKE 1 TABLET DAILY 12/05/19  Yes Tower, Wynelle Fanny, MD  amoxicillin-clavulanate (AUGMENTIN) 875-125 MG tablet Take 1 tablet by mouth 2 (two) times daily. Patient not taking: No sig reported 04/06/20   Venia Carbon, MD    The patient is critically ill with multiple organ systems failure and requires high complexity decision making for assessment and support, frequent evaluation and titration of therapies, application of advanced monitoring technologies and extensive interpretation of multiple databases. Critical Care Time devoted to patient care services described in this note independent of APP/resident time (if applicable)  is 30 minutes.   Sherrilyn Rist MD [facility] Personal pager: [phone] If unanswered, please page CCM On-call: [phone]

## 2020-04-24 NOTE — Progress Notes (Signed)
2 Days Post-Op   Subjective/Chief Complaint:  1 - Rule Out Left Ureteral Injury - s/p difficult colectomy for severe diverticulitis 12/29. Intraoperative consult to r/o ureteral injury and none found with ureterolysis. FU Renal US 12/30 no hdyro. No high drain output.  2 - Acute on Chronic Renal Insufficiency -  baseline Cr 2's with rise to 3 peri-op. Has been NPO, pressors. Renal US and CT 12/30 w/o hydro or fluid collecitons.   Today "Martha Ortega" is stable. Pressors weaned. Imaging yesterday reassuiring no hydro or large pelvic fluid collecitons on CT or [name].    Objective: Vital signs in last 24 hours: Temp:  [97.5 F (36.4 C)-98.3 F (36.8 C)] 98.3 F (36.8 C) (12/31 0800) Pulse Rate:  [72-139] 112 (12/31 0500) Resp:  [9-31] 18 (12/31 0500) BP: (65-161)/(30-114) 110/67 (12/31 0500) SpO2:  [84 %-100 %] 92 % (12/31 0500) Weight:  [67.9 kg-69.3 kg] 69.3 kg (12/31 0500) Last BM Date: 04/17/20  Intake/Output from previous day: 12/30 0701 - 12/31 0700 In: 1231 [P.O.:280; I.V.:803.1; IV Piggyback:147.9] Out: 1185 [Urine:370; Emesis/NG output:725; Drains:85; Stool:5] Intake/Output this shift: No intake/output data recorded.  General appearance: alert and sleepy but arousable. Frail.  Eyes: negative Nose: Nares normal. Septum midline. Mucosa normal. No drainage or sinus tenderness. Throat: lips, mucosa, and tongue normal; teeth and gums normal Neck: supple, symmetrical, trachea midline Back: symmetric, no curvature. ROM normal. No CVA tenderness. Resp: Not labored on [place] O2 Cardio: HR 110s by monitor.  GI: midline surgical dressing place that is clear, no drainage. Surgical drain wtih scant serosanguinous fluid that is non-foul. Rt sided iliostomy pink. Miniial output in appliance.  Extremities: extremities normal, atraumatic, no cyanosis or edema Lymph nodes: Cervical, supraclavicular, and axillary nodes normal. Neurologic: Grossly normal  Lab Results:  Recent Labs     04/23/20 0251 04/24/20 0231  WBC 12.3* 17.9*  HGB 9.6* 8.3*  HCT 28.5* 25.3*  PLT 399 330   BMET Recent Labs    04/23/20 0251 04/24/20 0231  NA 135 137  K 4.1 4.3  CL 106 101  CO2 17* 20*  GLUCOSE 95 107*  BUN 25* 32*  CREATININE 2.54* 3.48*  CALCIUM 7.5* 7.3*   PT/INR No results for input(s): LABPROT, INR in the last 72 hours. ABG No results for input(s): PHART, HCO3 in the last 72 hours.  Invalid input(s): PCO2, PO2  Studies/Results: CT ABDOMEN PELVIS WO CONTRAST  Result Date: 04/23/2020 CLINICAL DATA:  Elevated lactate it acid and hypotension. Status post total colectomy. EXAM: CT ABDOMEN AND PELVIS WITHOUT CONTRAST TECHNIQUE: Multidetector CT imaging of the abdomen and pelvis was performed following the standard protocol without IV contrast. COMPARISON:  CT abdomen pelvis 04/18/2020 FINDINGS: LOWER CHEST: Intermediate sized right and small left pleural effusion. Right basilar consolidation. HEPATOBILIARY: Multiple hepatic cysts are unchanged. Distended gallbladder without biliary dilatation. PANCREAS: Normal pancreas. No ductal dilatation or peripancreatic fluid collection. SPLEEN: Normal. ADRENALS/URINARY TRACT: The adrenal glands are normal. No hydronephrosis, nephroureterolithiasis or solid renal mass. The urinary bladder is normal for degree of distention STOMACH/BOWEL: Status post recent total colectomy. There is multifocal pneumoperitoneum throughout the abdomen. The greatest concentration of free air is in the right upper quadrant. Residual rectum is unremarkable. There is a right-sided ileostomy. No small bowel dilatation. There is mild mesenteric edema and free fluid within the abdomen. VASCULAR/LYMPHATIC: There is calcific atherosclerosis of the abdominal aorta. No lymphadenopathy. REPRODUCTIVE: Status post hysterectomy. No adnexal mass. MUSCULOSKELETAL. No bony spinal canal stenosis or focal osseous abnormality. OTHER: Packing material  in ventral abdominal incisions.  Gas tracking along the tissue superficial to the right greater than left anterior abdominal wall. IMPRESSION: 1. Status post recent total colectomy with multifocal pneumoperitoneum throughout the abdomen. The greatest concentration of free air is in the right upper quadrant. This is favored to be post-operative. 2. Dilated gallbladder is nonspecific in this setting. 3. Intermediate sized right and small left pleural effusions with right basilar consolidation. Aortic Atherosclerosis (ICD10-I70.0). Electronically Signed   By: Ulyses Jarred M.D.   On: 04/23/2020 00:17   US RENAL  Result Date: 04/23/2020 CLINICAL DATA:  85 year old female status post total colectomy. Query hydronephrosis, ureteral trauma. EXAM: RENAL / URINARY TRACT ULTRASOUND COMPLETE COMPARISON:  CT Abdomen and Pelvis 04/18/2020 and earlier. FINDINGS: Right Kidney: Renal measurements: 8.1 x 4.7 x 5.7 cm = volume: 114 mL. Echogenic right renal cortex (image 2). No right hydronephrosis or renal lesion. Left Kidney: Renal measurements: 8.0 x 4.1 x 4.1 cm = volume: 69 mL. Left renal cortex also appears increased in echogenicity relative to the spleen (image 14), similar to the right. No left hydronephrosis or renal lesion. Bladder: Decompressed, Foley catheter balloon visible. Other: None. IMPRESSION: Echogenic kidneys consistent with chronic medical renal disease. No hydronephrosis. Electronically Signed   By: Genevie Ann M.D.   On: 04/23/2020 08:11   DG CHEST PORT 1 VIEW  Result Date: 04/01/2020 CLINICAL DATA:  Hypotension EXAM: PORTABLE CHEST 1 VIEW COMPARISON:  03/27/2020, 04/18/2020 FINDINGS: Single frontal view of the chest demonstrates enteric catheter passing below diaphragm, tip excluded by collimation. The side port projects over the gastric body. Cardiac silhouette is unremarkable. There is elevation of the right hemidiaphragm. Rounded lucencies projecting over the right upper quadrant do not correspond to any loops of bowel on preceding  CT exam. Given findings on prior CT study, I cannot exclude pneumoperitoneum. Repeat CT or left lateral decubitus radiographs of the abdomen may be useful. Patchy areas of consolidation at the lung bases likely reflect atelectasis. Small right effusion is suspected. No pneumothorax. IMPRESSION: 1. Rounded gas lucencies projecting over right upper quadrant, suspicious for pneumoperitoneum. Repeat CT of the abdomen and pelvis may be useful. 2. Bibasilar consolidation, likely atelectasis. Trace right pleural effusion. 3. Enteric catheter as above. Critical Value/emergent results were called by telephone at the time of interpretation on 03/29/2020 at 10:51 pm to provider [facility] , who verbally acknowledged these results. Electronically Signed   By: Randa Ngo M.D.   On: 04/07/2020 22:55   DG Abd Portable 1V  Result Date: 04/15/2020 CLINICAL DATA:  Large bowel obstruction EXAM: PORTABLE ABDOMEN - 1 VIEW COMPARISON:  None. FINDINGS: Residual contrast within cecum, ascending colon, transverse colon, and descending colon as well as distal small bowel. There is distension of the cecum to 9.7 cm. IMPRESSION: Residual contrast within the colon proximal to the sigmoid reflecting a degree of persistent obstruction. Distension of the cecum to 9.7 cm. Electronically Signed   By: Macy Mis M.D.   On: 03/26/2020 09:27    Anti-infectives: Anti-infectives (From admission, onward)   Start     Dose/Rate Route Frequency Ordered Stop   04/13/2020 1200  piperacillin-tazobactam (ZOSYN) IVPB 2.25 g        2.25 g 100 mL/hr over 30 Minutes Intravenous Every 8 hours 04/04/2020 1138        Assessment/Plan:  1 - Rule Out Left Ureteral Injury - fortunately no overt injury.   2 - Acute on Chronic Renal Insufficiency -  Likely pre-renal in setting of  necessary peri-op pressors, I suspect this wil returnt to basline as voluemstatus improves.  We will follow PRN at this poitn, please call with questions.     Martha Ortega 04/24/2020

## 2020-04-24 NOTE — Progress Notes (Addendum)
Subjective Vasopressors off. Awake, alert and reports she is overall feeling reasonably well - asking if the plan was for discharge home today. She denies n/v with NG in place. She reports good pain control.  Objective: Vital signs in last 24 hours: Temp:  [97.5 F (36.4 C)-98.2 F (36.8 C)] 97.6 F (36.4 C) (12/31 0300) Pulse Rate:  [72-139] 112 (12/31 0500) Resp:  [9-31] 18 (12/31 0500) BP: (65-161)/(30-114) 110/67 (12/31 0500) SpO2:  [84 %-100 %] 92 % (12/31 0500) Weight:  [67.9 kg-69.3 kg] 69.3 kg (12/31 0500) Last BM Date: 04/17/20  Intake/Output from previous day: 12/30 0701 - 12/31 0700 In: 1231 [P.O.:280; I.V.:803.1; IV Piggyback:147.9] Out: 1185 [Urine:370; Emesis/NG output:725; Drains:85; Stool:5] Intake/Output this shift: No intake/output data recorded.  Gen: NAD, comfortable CV: tachycardic rate, regular rhythm Pulm: Normal work of breathing Abd: Soft, minimally ttp; nondistended; ileostomy pink with sweat in appliance, JP serosang; NG bilious Ext: SCDs in place  Lab Results: CBC  Recent Labs    04/23/20 0251 04/24/20 0231  WBC 12.3* 17.9*  HGB 9.6* 8.3*  HCT 28.5* 25.3*  PLT 399 330   BMET Recent Labs    04/23/20 0251 04/24/20 0231  NA 135 137  K 4.1 4.3  CL 106 101  CO2 17* 20*  GLUCOSE 95 107*  BUN 25* 32*  CREATININE 2.54* 3.48*  CALCIUM 7.5* 7.3*   PT/INR No results for input(s): LABPROT, INR in the last 72 hours. ABG No results for input(s): PHART, HCO3 in the last 72 hours.  Invalid input(s): PCO2, PO2  Studies/Results:  Anti-infectives: Anti-infectives (From admission, onward)   Start     Dose/Rate Route Frequency Ordered Stop   04/20/2020 1200  piperacillin-tazobactam (ZOSYN) IVPB 2.25 g        2.25 g 100 mL/hr over 30 Minutes Intravenous Every 8 hours 04/13/2020 1138         Assessment/Plan: Patient Active Problem List   Diagnosis Date Noted  . Severe sepsis with septic shock ([place]) 04/23/2020  . Postoperative hypovolemic  shock 04/23/2020  . Pneumoperitoneum 04/23/2020  . Renal failure (ARF), acute on chronic ([place]) 04/23/2020  . Elevated troponin 04/23/2020  . Ureter injury   . Large bowel obstruction ([place])   . Cerebrovascular accident (CVA) ([place])   . Hypomagnesemia   . Hypokalemia   . Sepsis ([place])   . Pericolonic abscess due to diverticulitis 04/21/2020  . Diverticulitis of sigmoid colon with abscess 03/28/2020  . Aortic atherosclerosis ( [place]) 04/04/2020  . ABLA (acute blood loss anemia) 03/26/2020  . Blood in stool   . Abdominal pain 04/06/2020  . CKD (chronic kidney disease), stage IV ([place]) 04/06/2020  . Mild protein-calorie malnutrition ([place]) 04/06/2020  . Osteopenia 07/26/2015  . Estrogen deficiency 06/23/2015  . History of CVA (cerebrovascular accident) 03/02/2015  . Encounter for Medicare annual wellness exam 09/23/2013  . Spinal stenosis of lumbar region 09/23/2013  . GERD (gastroesophageal reflux disease) 12/12/2011  . Renal insufficiency 11/26/2008  . ALLERGIC RHINITIS 09/10/2007  . Hyperlipidemia 09/08/2006  . Essential hypertension 09/08/2006  . POSTNASAL DRIP SYNDROME 09/08/2006   s/p Procedure(s): TOTAL ABDOMINAL COLECTOMY WITH END ILEOSTOMY; GASTRIC REPAIR 04/20/2020  -Renal US 12/30 showed no hydronephrosis -Oliguria, Cr up to 3.48 today; jp remains low volume. Nephrology following with support, bicarb -Possible ACS vs demand ischemia noted - as per cardiology -Cont JP drain to bulb suction -NPO, MIVF, NGT to low intermittent suction -Would recommend starting TPN - anticipate she will have protracted ileus and additionally has had many  days with very poor appetite -PPx: SCDs, ok for chemical dvt ppx from our standpoint   LOS: 10 days   Sharon Mt. Dema Severin, M.D. [facility] Use [email or web address] to contact on call provider

## 2020-04-24 NOTE — Progress Notes (Signed)
[facility] Progress Note  Subjective: seen in ICU, NG tube in . Creat up 3.4, UOP 250 cc  Vitals:   04/24/20 0300 04/24/20 0345 04/24/20 0400 04/24/20 0500  BP:  (!) 108/47 (!) 110/51 110/67  Pulse:  (!) 112 (!) 112 (!) 112  Resp:  17 15 18   Temp: 97.6 F (36.4 C)     TempSrc: Oral     SpO2:  (!) 89% 90% 92%  Weight:    69.3 kg  Height:        Exam: Gen elderly female, NG tube in place  No jvd or bruits Chest dec'd at R base, L side clear RRR no MRG Abd large midline wound dressed, and mid abd ostomy intact, +BS GU foley cath w/ small amts clear urine Ext diffuse sig bilat LE edema Neuro is alert, nonfocal    Home meds:  - asa 81/  crestor 20  - hydralazine 50 bid/ losartan 50 qd/ toprol xl 50 qd  - prn's/ vitamins/ supplements     Date              Creat               eGFR    2008- 2013   1.2- 1.4    2014- 2018   1.3- 1.7            36- 52, stage IIIb    2019             1.80                 35    2020             1.78                 33, stage IIIb    10/2019          2.06                 28, stage IV    04/06/20       2.38                 18, stage IV    04/23/2020       2.09                 23    04/23/20       2.54                 18    UA 12/29 - negative   UNa <10, UCr 122    CT abd pelv no contrast 12/20 - ADRENALS/URINARY TRACT: The adrenal glands are normal. No hydronephrosis, nephroureterolithiasis or solid renal mass. The urinary bladder is normal for degree of distention    CXR 12/29 - IMPRESSION: 1. Rounded gas lucencies projecting over right upper quadrant, suspicious for pneumoperitoneum. Repeat CT of the abdomen and pelvis may be useful. 2. Bibasilar consolidation, likely atelectasis. Trace right pleural effusion. 3. Enteric catheter as above.     Assessment/ Plan: 1. AKI on CKD IV - b/l creat 2.0 from July 2021, eGFR 28. AKI likely due to post-op shock/ hypoperfusion.  No nephrotoxins noted.  On pressors. UA negative and renal US/  CT w/o obstruction. Admit creat 1.8 >> up to 2.5 yest and 3.4 today. Marginal UOP.  Urine Na low. No uremic signs. Lower IVF's to 40 cc/hr w/ sig edema and > 10kg up. Cont supportive care, hopefully will  recover soon.  2. Diverticulitis - sp total colectomy on 12/29 3. Shock - postop, getting IVF's and pressor support, on IV abx w/ Zosyn 4. H/o CVA 5. Anemia - Hb in 9- 10 range, transfuse prn 6. BP/ volume - BP's low to low-normal. Up 10kg by wt's, +edema LE's. CXR w/o edema.        Rob Jackalynn Art 04/24/2020, 7:10 AM   Recent Labs  Lab 04/23/20 0251 04/24/20 0231  K 4.1 4.3  BUN 25* 32*  CREATININE 2.54* 3.48*  CALCIUM 7.5* 7.3*  PHOS 3.9 4.5  HGB 9.6* 8.3*   Inpatient medications: . bisacodyl  10 mg Rectal Daily  . chlorhexidine  15 mL Mouth Rinse BID  . Chlorhexidine Gluconate Cloth  6 each Topical Daily  . lip balm  1 application Topical BID  . mouth rinse  15 mL Mouth Rinse q12n4p   . sodium chloride 10 mL/hr at 04/24/20 0000  . famotidine (PEPCID) IV Stopped (04/23/20 1201)  . methocarbamol (ROBAXIN) IV Stopped (04/04/2020 2300)  . norepinephrine (LEVOPHED) Adult infusion Stopped (04/24/20 0157)  . piperacillin-tazobactam Stopped (04/24/20 0520)  .  sodium bicarbonate (isotonic) infusion in sterile water 65 mL/hr at 04/24/20 0047   acetaminophen **OR** acetaminophen, alum & mag hydroxide-simeth, diphenhydrAMINE, HYDROcodone-acetaminophen, lip balm, magic mouthwash, methocarbamol (ROBAXIN) IV, morphine injection, ondansetron **OR** ondansetron (ZOFRAN) IV, phenol

## 2020-04-24 NOTE — Evaluation (Signed)
Physical Therapy Re-evaluation Patient Details Name: Martha Ortega MRN: [id number] DOB: 02-08-1935 Today's Date: 04/24/2020   History of Present Illness  Pt admitted with abdominal pain, blood in stool and sepsis 2* IB related to diverticulitis.  Pt with hx of CVA ~ 5 yrs ago with residual L UE and chest "tightness" and L foot "numbness.  Pt s/p total abdominal colectomy with end ileostomy 04/09/2020  Clinical Impression  Pt as above and with noted decline in performance since surgery 04/23/2020.  Pt with functional mobility limitations 2* generalized weakness, post-op pain, and balance deficits.  This date, pt HR elevated dramatically with limited activity - RN aware.  Pt still hopes to progress to dc home with family assist but will be dependent on acute stay progress.    Follow Up Recommendations Home health PT    Equipment Recommendations  None recommended by PT    Recommendations for Other Services OT consult     Precautions / Restrictions Precautions Precautions: Fall;Other (comment) Precaution Comments: New Ileostomy; JP drain on R Restrictions Weight Bearing Restrictions: No      Mobility  Bed Mobility Overal bed mobility: Needs Assistance Bed Mobility: Sit to Supine;Rolling Rolling: Mod assist     Sit to supine: Max assist;+2 for physical assistance;+2 for safety/equipment   General bed mobility comments: Increased time with cues for sequence and assist to mange LEs as well as control trunk    Transfers Overall transfer level: Needs assistance Equipment used: Rolling walker (2 wheeled) Transfers: Sit to/from [facility] Sit to Stand: Mod assist;+2 physical assistance;+2 safety/equipment Stand pivot transfers: Mod assist;+2 physical assistance;+2 safety/equipment;From elevated surface       General transfer comment: cues for LE management and use of UEs to self assist  Ambulation/Gait Ambulation/Gait assistance: Mod assist;+2  safety/equipment Gait Distance (Feet): 2 Feet Assistive device: Rolling walker (2 wheeled) Gait Pattern/deviations: Step-to pattern;Decreased step length - right;Decreased step length - left;Shuffle;Trunk flexed Gait velocity: decr   General Gait Details: cues for posture and position from RW.  Pt ltd by fatigue and elevated HR  Stairs            Wheelchair Mobility    Modified Rankin (Stroke Patients Only)       Balance Overall balance assessment: Needs assistance Sitting-balance support: No upper extremity supported;Feet supported Sitting balance-Leahy Scale: Fair     Standing balance support: Bilateral upper extremity supported Standing balance-Leahy Scale: Poor                               Pertinent Vitals/Pain Pain Assessment: Faces Faces Pain Scale: Hurts little more Pain Location: abdomen Pain Descriptors / Indicators: Aching;Sore Pain Intervention(s): Limited activity within patient's tolerance;Monitored during session    [place] expects to be discharged to:: Private residence Living Arrangements: Children Available Help at Discharge: Family Type of Home: House Home Access: Stairs to enter Entrance Stairs-Rails: Right Entrance Stairs-Number of Steps: 7 Home Layout: One level Home Equipment: Environmental consultant - 2 wheels;Bedside commode      Prior Function Level of Independence: Independent         Comments: Pt states ambulating sans assistive device     Hand Dominance        Extremity/Trunk Assessment   Upper Extremity Assessment Upper Extremity Assessment: LUE deficits/detail LUE Deficits / Details: c/o "tightness" with functional strength but decresased fine motor LUE Coordination: decreased fine motor    Lower Extremity Assessment Lower  Extremity Assessment: LLE deficits/detail LLE Deficits / Details: Strength within functional limits but with c/o foot numbness and noted mild steppage in gait LLE Sensation:  decreased proprioception;decreased light touch       Communication   Communication: No difficulties;HOH  Cognition Arousal/Alertness: Awake/alert Behavior During Therapy: WFL for tasks assessed/performed Overall Cognitive Status: Within Functional Limits for tasks assessed                                 General Comments: AxO x 2 very pleasant      General Comments      Exercises     Assessment/Plan    PT Assessment Patient needs continued PT services  PT Problem List Decreased strength;Decreased range of motion;Decreased activity tolerance;Decreased balance;Decreased mobility;Decreased knowledge of use of DME;Pain;Decreased safety awareness       PT Treatment Interventions DME instruction;Gait training;Stair training;Functional mobility training;Therapeutic activities;Therapeutic exercise;Patient/family education;Balance training    PT Goals (Current goals can be found in the Care Plan section)  Acute Rehab PT Goals Patient Stated Goal: HOME PT Goal Formulation: With patient Time For Goal Achievement: 05/29/2020 Potential to Achieve Goals: Good    Frequency Min 3X/week (abdomen)   Barriers to discharge        Co-evaluation               AM-PAC PT "6 Clicks" Mobility  Outcome Measure Help needed turning from your back to your side while in a flat bed without using bedrails?: A Lot Help needed moving from lying on your back to sitting on the side of a flat bed without using bedrails?: A Lot Help needed moving to and from a bed to a chair (including a wheelchair)?: A Lot Help needed standing up from a chair using your arms (e.g., wheelchair or bedside chair)?: A Lot Help needed to walk in hospital room?: A Lot Help needed climbing 3-5 steps with a railing? : Total 6 Click Score: 11    End of Session Equipment Utilized During Treatment: Gait belt Activity Tolerance: Patient limited by fatigue;Patient limited by pain Patient left: in bed;with  call bell/phone within reach;with nursing/sitter in room Nurse Communication: Mobility status PT Visit Diagnosis: Unsteadiness on feet (R26.81);Muscle weakness (generalized) (M62.81);Difficulty in walking, not elsewhere classified (R26.2);Pain    Time: 0630-1601 PT Time Calculation (min) (ACUTE ONLY): 30 min   Charges:   PT Evaluation $PT Re-evaluation: 1 Re-eval PT Treatments $Therapeutic Activity: 8-22 mins        Debe Coder PT Acute Rehabilitation Services Pager [phone] Office [phone]   Veera Stapleton 04/24/2020, 5:32 PM

## 2020-04-25 ENCOUNTER — Inpatient Hospital Stay (HOSPITAL_COMMUNITY): Payer: Medicare Other

## 2020-04-25 DIAGNOSIS — K56609 Unspecified intestinal obstruction, unspecified as to partial versus complete obstruction: Secondary | ICD-10-CM | POA: Diagnosis not present

## 2020-04-25 DIAGNOSIS — R6521 Severe sepsis with septic shock: Secondary | ICD-10-CM | POA: Diagnosis not present

## 2020-04-25 DIAGNOSIS — A419 Sepsis, unspecified organism: Secondary | ICD-10-CM | POA: Diagnosis not present

## 2020-04-25 DIAGNOSIS — K5721 Diverticulitis of large intestine with perforation and abscess with bleeding: Secondary | ICD-10-CM | POA: Diagnosis not present

## 2020-04-25 DIAGNOSIS — R778 Other specified abnormalities of plasma proteins: Secondary | ICD-10-CM | POA: Diagnosis not present

## 2020-04-25 DIAGNOSIS — N184 Chronic kidney disease, stage 4 (severe): Secondary | ICD-10-CM | POA: Diagnosis not present

## 2020-04-25 DIAGNOSIS — D62 Acute posthemorrhagic anemia: Secondary | ICD-10-CM | POA: Diagnosis not present

## 2020-04-25 DIAGNOSIS — N179 Acute kidney failure, unspecified: Secondary | ICD-10-CM | POA: Diagnosis not present

## 2020-04-25 DIAGNOSIS — I9589 Other hypotension: Secondary | ICD-10-CM | POA: Diagnosis not present

## 2020-04-25 DIAGNOSIS — I480 Paroxysmal atrial fibrillation: Secondary | ICD-10-CM | POA: Diagnosis not present

## 2020-04-25 LAB — CBC WITH DIFFERENTIAL/PLATELET
Abs Immature Granulocytes: 0.52 10*3/uL — ABNORMAL HIGH (ref 0.00–0.07)
Basophils Absolute: 0.1 10*3/uL (ref 0.0–0.1)
Basophils Relative: 0 %
Eosinophils Absolute: 0 10*3/uL (ref 0.0–0.5)
Eosinophils Relative: 0 %
HCT: 23.3 % — ABNORMAL LOW (ref 36.0–46.0)
Hemoglobin: 7.9 g/dL — ABNORMAL LOW (ref 12.0–15.0)
Immature Granulocytes: 2 %
Lymphocytes Relative: 5 %
Lymphs Abs: 1.2 10*3/uL (ref 0.7–4.0)
MCH: 28.9 pg (ref 26.0–34.0)
MCHC: 33.9 g/dL (ref 30.0–36.0)
MCV: 85.3 fL (ref 80.0–100.0)
Monocytes Absolute: 0.9 10*3/uL (ref 0.1–1.0)
Monocytes Relative: 4 %
Neutro Abs: 20.3 10*3/uL — ABNORMAL HIGH (ref 1.7–7.7)
Neutrophils Relative %: 89 %
Platelets: 294 10*3/uL (ref 150–400)
RBC: 2.73 MIL/uL — ABNORMAL LOW (ref 3.87–5.11)
RDW: 17.8 % — ABNORMAL HIGH (ref 11.5–15.5)
WBC: 22.9 10*3/uL — ABNORMAL HIGH (ref 4.0–10.5)
nRBC: 0.6 % — ABNORMAL HIGH (ref 0.0–0.2)

## 2020-04-25 LAB — TRIGLYCERIDES: Triglycerides: 132 mg/dL (ref <150)

## 2020-04-25 LAB — COMPREHENSIVE METABOLIC PANEL
ALT: 23 U/L (ref 0–44)
AST: 57 U/L — ABNORMAL HIGH (ref 15–41)
Albumin: 2 g/dL — ABNORMAL LOW (ref 3.5–5.0)
Alkaline Phosphatase: 83 U/L (ref 38–126)
Anion gap: 16 — ABNORMAL HIGH (ref 5–15)
BUN: 38 mg/dL — ABNORMAL HIGH (ref 8–23)
CO2: 22 mmol/L (ref 22–32)
Calcium: 7.3 mg/dL — ABNORMAL LOW (ref 8.9–10.3)
Chloride: 95 mmol/L — ABNORMAL LOW (ref 98–111)
Creatinine, Ser: 3.98 mg/dL — ABNORMAL HIGH (ref 0.44–1.00)
GFR, Estimated: 11 mL/min — ABNORMAL LOW (ref >60)
Glucose, Bld: 243 mg/dL — ABNORMAL HIGH (ref 70–99)
Potassium: 3.9 mmol/L (ref 3.5–5.1)
Sodium: 133 mmol/L — ABNORMAL LOW (ref 135–145)
Total Bilirubin: 0.8 mg/dL (ref 0.3–1.2)
Total Protein: 4.9 g/dL — ABNORMAL LOW (ref 6.5–8.1)

## 2020-04-25 LAB — RENAL FUNCTION PANEL
Albumin: 2 g/dL — ABNORMAL LOW (ref 3.5–5.0)
Anion gap: 14 (ref 5–15)
BUN: 40 mg/dL — ABNORMAL HIGH (ref 8–23)
CO2: 24 mmol/L (ref 22–32)
Calcium: 7.3 mg/dL — ABNORMAL LOW (ref 8.9–10.3)
Chloride: 96 mmol/L — ABNORMAL LOW (ref 98–111)
Creatinine, Ser: 3.82 mg/dL — ABNORMAL HIGH (ref 0.44–1.00)
GFR, Estimated: 11 mL/min — ABNORMAL LOW (ref >60)
Glucose, Bld: 245 mg/dL — ABNORMAL HIGH (ref 70–99)
Phosphorus: 4.2 mg/dL (ref 2.5–4.6)
Potassium: 3.9 mmol/L (ref 3.5–5.1)
Sodium: 134 mmol/L — ABNORMAL LOW (ref 135–145)

## 2020-04-25 LAB — GLUCOSE, CAPILLARY
Glucose-Capillary: 230 mg/dL — ABNORMAL HIGH (ref 70–99)
Glucose-Capillary: 240 mg/dL — ABNORMAL HIGH (ref 70–99)
Glucose-Capillary: 200 mg/dL — ABNORMAL HIGH (ref 70–99)
Glucose-Capillary: 185 mg/dL — ABNORMAL HIGH (ref 70–99)
Glucose-Capillary: 178 mg/dL — ABNORMAL HIGH (ref 70–99)
Glucose-Capillary: 152 mg/dL — ABNORMAL HIGH (ref 70–99)

## 2020-04-25 LAB — BASIC METABOLIC PANEL
Anion gap: 14 (ref 5–15)
BUN: 46 mg/dL — ABNORMAL HIGH (ref 8–23)
CO2: 24 mmol/L (ref 22–32)
Calcium: 7.5 mg/dL — ABNORMAL LOW (ref 8.9–10.3)
Chloride: 95 mmol/L — ABNORMAL LOW (ref 98–111)
Creatinine, Ser: 4.17 mg/dL — ABNORMAL HIGH (ref 0.44–1.00)
GFR, Estimated: 10 mL/min — ABNORMAL LOW (ref >60)
Glucose, Bld: 188 mg/dL — ABNORMAL HIGH (ref 70–99)
Potassium: 4 mmol/L (ref 3.5–5.1)
Sodium: 133 mmol/L — ABNORMAL LOW (ref 135–145)

## 2020-04-25 LAB — PREALBUMIN: Prealbumin: <5 mg/dL — ABNORMAL LOW (ref 18–38)

## 2020-04-25 LAB — MAGNESIUM: Magnesium: 2.7 mg/dL — ABNORMAL HIGH (ref 1.7–2.4)

## 2020-04-25 LAB — PROCALCITONIN: Procalcitonin: 38.17 ng/mL

## 2020-04-25 LAB — PHOSPHORUS: Phosphorus: 3.9 mg/dL (ref 2.5–4.6)

## 2020-04-25 MED ORDER — TRAVASOL 10 % IV SOLN
INTRAVENOUS | Status: AC
  Filled 2020-04-25: qty 480

## 2020-04-25 MED ORDER — FUROSEMIDE 10 MG/ML IJ SOLN
80.0000 mg | Freq: Two times a day (BID) | INTRAMUSCULAR | Status: DC
  Administered 2020-04-25 – 2020-04-30 (×10): 80 mg via INTRAVENOUS
  Filled 2020-04-25 (×10): qty 8

## 2020-04-25 NOTE — Progress Notes (Signed)
[facility] Progress Note  Subjective: seen in room, doesn't feel good, no specific c/o's.  No n/v no confusion. Bp's are normal, off of levo gtt at this time. I/O yest were 2.7 L in and 270 cc uop out, +GI losses. She is now 19 L +. CXR showing early edema R upper chest.   Vitals:   04/25/20 1300 04/25/20 1400 04/25/20 1500 04/25/20 1600  BP: (!) 139/57 135/64 116/62 (!) 133/46  Pulse: (!) 57 (!) 114 (!) 149 95  Resp: (!) 25 19 20 19  Temp:      TempSrc:      SpO2: 96% 99% 96% 92%  Weight:      Height:        Exam: Gen elderly female, NG tube in place , tired appearing No jvd or bruits Chest dec'd at R base, L side clear RRR no MRG Abd large midline wound dressed, and mid abd ostomy intact, +BS GU foley cath w/ small amts clear urine Ext diffuse sig bilat LE edema Neuro is alert, nonfocal    Home meds:  - asa 81/  crestor 20  - hydralazine 50 bid/ losartan 50 qd/ toprol xl 50 qd  - prn's/ vitamins/ supplements     Date              Creat               eGFR    2008- 2013   1.2- 1.4    2014- 2018   1.3- 1.7            36- 52, stage IIIb    2019             1.80                 35    2020             1.78                 33, stage IIIb    10/2019          2.06                 28, stage IV    04/06/20       2.38                 18, stage IV    04/10/2020       2.09                 23    04/23/20       2.54                 18    UA 12/29 - negative   UNa <10, UCr 122    CT abd pelv no contrast 12/20 - ADRENALS/URINARY TRACT: The adrenal glands are normal. No hydronephrosis, nephroureterolithiasis or solid renal mass. The urinary bladder is normal for degree of distention    CXR 12/29 - IMPRESSION: 1. Rounded gas lucencies projecting over right upper quadrant, suspicious for pneumoperitoneum. Repeat CT of the abdomen and pelvis may be useful. 2. Bibasilar consolidation, likely atelectasis. Trace right pleural effusion. 3. Enteric catheter as above.      Assessment/ Plan: 1. AKI on CKD IV - b/l creat 2.0 from July 2021, eGFR 28. AKI likely due to post-op shock/ hypoperfusion.  No nephrotoxins noted.  On pressors. UA negative and renal US/ CT w/o obstruction. Admit   creat 1.8 >> up to 2.5 > 3.4 > 3.8 today . Marginal UOP 200- 300 cc/d. Urine Na low. No uremic signs, however vol overload worsening. Off pressors now, will start IV lasix. May need RRT soon.  2. Diverticulitis - sp total colectomy on 12/29 3. Shock - postop, getting TNA, off pressors. Getting IV abx w/ Zosyn 4. H/o CVA 5. Anemia - Hb in 9- 10 range, transfuse prn 6. BP/ volume - BP's low to low-normal. Up 15kg now       Rob  04/25/2020, 4:47 PM   Recent Labs  Lab 04/24/20 0231 04/25/20 0442  K 4.3 3.9  3.9  BUN 32* 38*  40*  CREATININE 3.48* 3.98*  3.82*  CALCIUM 7.3* 7.3*  7.3*  PHOS 4.5 3.9  4.2  HGB 8.3* 7.9*   Inpatient medications: . bisacodyl  10 mg Rectal Daily  . chlorhexidine  15 mL Mouth Rinse BID  . Chlorhexidine Gluconate Cloth  6 each Topical Daily  . insulin aspart  0-6 Units Subcutaneous Q4H  . lip balm  1 application Topical BID  . mouth rinse  15 mL Mouth Rinse q12n4p  . vancomycin variable dose per unstable renal function (pharmacist dosing)   Does not apply See admin instructions   . sodium chloride Stopped (04/24/20 1705)  . amiodarone 30 mg/hr (04/25/20 1548)  . anidulafungin Stopped (04/25/20 1114)  . famotidine (PEPCID) IV Stopped (04/25/20 1104)  . methocarbamol (ROBAXIN) IV Stopped (03/28/2020 2300)  . norepinephrine (LEVOPHED) Adult infusion Stopped (04/25/20 0842)  . piperacillin-tazobactam Stopped (04/25/20 1205)  . TPN ADULT (ION) 40 mL/hr at 04/25/20 1248  . TPN ADULT (ION)     acetaminophen **OR** acetaminophen, alum & mag hydroxide-simeth, diphenhydrAMINE, HYDROcodone-acetaminophen, lip balm, magic mouthwash, methocarbamol (ROBAXIN) IV, morphine injection, ondansetron **OR** ondansetron (ZOFRAN) IV,  phenol       

## 2020-04-25 NOTE — Progress Notes (Signed)
Progress Note  Patient Name: Martha Ortega Date of Encounter: 04/25/2020  [facility] Cardiologist: Buford Dresser, MD   Subjective   Went into afib RVR yesterday. She reports feeling very tired and weak today, asking if she needs more surgery. Denies chest pain.  Inpatient Medications    Scheduled Meds: . bisacodyl  10 mg Rectal Daily  . chlorhexidine  15 mL Mouth Rinse BID  . Chlorhexidine Gluconate Cloth  6 each Topical Daily  . insulin aspart  0-6 Units Subcutaneous Q4H  . lip balm  1 application Topical BID  . mouth rinse  15 mL Mouth Rinse q12n4p  . vancomycin variable dose per unstable renal function (pharmacist dosing)   Does not apply See admin instructions   Continuous Infusions: . sodium chloride Stopped (04/24/20 1705)  . amiodarone 30 mg/hr (04/25/20 0617)  . anidulafungin 100 mg (04/25/20 0934)  . famotidine (PEPCID) IV Stopped (04/24/20 1001)  . lactated ringers 75 mL/hr at 04/25/20 0617  . methocarbamol (ROBAXIN) IV Stopped (04/18/2020 2300)  . norepinephrine (LEVOPHED) Adult infusion 6 mcg/min (04/25/20 0617)  . piperacillin-tazobactam Stopped (04/25/20 0509)  . TPN ADULT (ION) 40 mL/hr at 04/25/20 0617   PRN Meds: acetaminophen **OR** acetaminophen, alum & mag hydroxide-simeth, diphenhydrAMINE, HYDROcodone-acetaminophen, lip balm, magic mouthwash, methocarbamol (ROBAXIN) IV, morphine injection, ondansetron **OR** ondansetron (ZOFRAN) IV, phenol   Vital Signs    Vitals:   04/25/20 0615 04/25/20 0800 04/25/20 0829 04/25/20 0900  BP: 129/77 (!) 142/93  (!) 132/58  Pulse: 95 96  88  Resp: (!) 27 (!) 25  (!) 22  Temp:   98.8 F (37.1 C)   TempSrc:   Oral   SpO2: 98% 96%  97%  Weight:      Height:        Intake/Output Summary (Last 24 hours) at 04/25/2020 1012 Last data filed at 04/25/2020 0800 Gross per 24 hour  Intake 4292.87 ml  Output 245 ml  Net 4047.87 ml   Last 3 Weights 04/24/2020 04/23/2020 04/09/2020  Weight (lbs) 152 lb 12.5  oz 149 lb 11.1 oz 132 lb  Weight (kg) 69.3 kg 67.9 kg 59.875 kg      Telemetry    Went into afib RVR at 14:10 12/31, initially 130-140 bpm, now around 100 bpm - Personally Reviewed  ECG    12/31 afib RVR at 154 bpm - Personally Reviewed  Physical Exam   GEN: Frail appearing elderly woman, appears uncomfortable Neck: No JVD appreciated Cardiac: irregularly irregular S1/S2, no murmurs, rubs, or gallops.  Respiratory: Clear to auscultation bilaterally in anterior and lateral lung fields GI: Soft, nondistended, mildly tender MS: No edema; No deformity. Neuro:  Nonfocal  Psych: Normal affect   Labs    High Sensitivity Troponin:   Recent Labs  Lab 04/21/2020 2234 04/23/20 0023 04/23/20 0251  TROPONINIHS 106* 128* 139*      Chemistry Recent Labs  Lab 04/05/2020 2234 04/23/20 0251 04/24/20 0231 04/25/20 0442  NA 137 135 137 133*  134*  K 4.1 4.1 4.3 3.9  3.9  CL 109 106 101 95*  96*  CO2 17* 17* 20* 22  24  GLUCOSE 99 95 107* 243*  245*  BUN 24* 25* 32* 38*  40*  CREATININE 2.53* 2.54* 3.48* 3.98*  3.82*  CALCIUM 7.4* 7.5* 7.3* 7.3*  7.3*  PROT 4.0*  --   --  4.9*  ALBUMIN 1.8* 2.4* 2.0* 2.0*  2.0*  AST 33  --   --  57*  ALT 14  --   --  23  ALKPHOS 30*  --   --  83  BILITOT 0.9  --   --  0.8  GFRNONAA 18* 18* 13* 11*  11*  ANIONGAP 11 12 16* 16*  14     Hematology Recent Labs  Lab 04/23/20 0251 04/24/20 0231 04/25/20 0442  WBC 12.3* 17.9* 22.9*  RBC 3.31* 2.92* 2.73*  HGB 9.6* 8.3* 7.9*  HCT 28.5* 25.3* 23.3*  MCV 86.1 86.6 85.3  MCH 29.0 28.4 28.9  MCHC 33.7 32.8 33.9  RDW 17.1* 17.7* 17.8*  PLT 399 330 294    BNPNo results for input(s): BNP, PROBNP in the last 168 hours.   DDimer No results for input(s): DDIMER in the last 168 hours.   Radiology    DG Abd 1 View  Result Date: 04/25/2020 CLINICAL DATA:  Orogastric tube placement EXAM: ABDOMEN - 1 VIEW COMPARISON:  None. FINDINGS: Tip and side port of the orogastric tube project  within the stomach. No visible dilated bowel. IMPRESSION: Orogastric tube tip in the stomach. Electronically Signed   By: Ulyses Jarred M.D.   On: 04/25/2020 03:06   DG CHEST PORT 1 VIEW  Result Date: 04/24/2020 CLINICAL DATA:  New central line placement. EXAM: PORTABLE CHEST 1 VIEW COMPARISON:  Radiograph 2 days ago 04/17/2020 FINDINGS: New right subclavian central line tip at the atrial caval junction. There is no pneumothorax. Tip and side port of the enteric tube below the diaphragm in the stomach. Hazy bilateral lung base opacities, right greater than left, likely combination of pleural fluid and atelectasis. Vascular congestion. Heart is normal in size. Aortic atherosclerosis. Similar or improving right upper quadrant free air to prior abdominal CT, likely postsurgical. IMPRESSION: 1. Right subclavian central line with tip at the atrial caval junction. No pneumothorax. 2. Hazy bilateral lung base opacities, right greater than left, likely combination of pleural fluid and atelectasis. Vascular congestion. Electronically Signed   By: Keith Rake M.D.   On: 04/24/2020 16:25    Cardiac Studies   Echo 04/21/20  1. Left ventricular ejection fraction, by estimation, is 60 to 65%. The  left ventricle has normal function. The left ventricle has no regional  wall motion abnormalities. There is mild concentric left ventricular  hypertrophy. Left ventricular diastolic  parameters are consistent with Grade I diastolic dysfunction (impaired  relaxation).  2. Right ventricular systolic function is normal. The right ventricular  size is normal. There is normal pulmonary artery systolic pressure. The  estimated right ventricular systolic pressure is 25.8 mmHg.  3. The mitral valve is normal in structure. No evidence of mitral valve  regurgitation. No evidence of mitral stenosis.  4. The aortic valve is normal in structure. Aortic valve regurgitation is  not visualized. No aortic stenosis is  present.  5. The inferior vena cava is normal in size with greater than 50%  respiratory variability, suggesting right atrial pressure of 3 mmHg.   Patient Profile     86 y.o. female with PMH CVA, CKD stage 4, hypertension, hyperlipidemia, complicated diverticulitis s/p surgery 04/15/2020 who is being followed for elevated troponins and atrial fibrillation at the request of Dr. Grandville Silos  Assessment & Plan    Atrial fibrillation, postoperative -no prior history of this -likely triggered by septic shock/acute illness, s/p OR 03/26/2020 -agree with amiodarone IV for now given NPO -CHA2DS2/VAS Stroke Risk Points=6; however, she is anemic and postop. She would benefit from anticoagulation once more stable, but her current anemia (  with Hgb downtrending) makes heparin high risk at this point. -as she has required pressors recently, would avoid beta blocker/calcium channel blocker for now  Sepsis, with diverticulitis/GI as source, with hypovolemic shock - since surgery, she has had hypotension, minimal UOP and lactic acidosis. - Hypotensive despite IVF. On norepinephrine drip up until this AM, weaned off  - IV abx per primary team/PCCM  Recurrent diverticulitis s/p ex-lap and total colectomy and end ileostomy now with bowel obstruction due to sigmoid stricture with contained perforation - management per surgery/primary team - on vancomycin, zosyn, and anidulafungin  Elevated troponin - HS troponin elevated in the setting of septic and hypovolemic shock; asymptomatic, normal echo supports demand ischemia over ACS - Patient has no prior significant cardiac history.  - Pre-op echo 12/28 showed preserved EF, G1DD   AKI with oliguira - Baseline creatinine 1.7-2 - creatinine 3.98 today - being followed by nephrology  Hypertension, history of, now with hypotension - antihypertensives held in the setting of shock - required levophed, weaned off this AM - was on hydralazine 50mg  BID, Toprol  50mg  daily, losartan 50mg  daily as an outpatient  Hyperlipidemia - LDL 63 11/01/19 - was on rosuvastatin as an outpatient. Resume when able  Electrolyte abnormalities - Keep Mag >2 and K>4  Anemia with suspected GIB - Aspirin held - Hgb has remained in the 7 range.Today 7.9 - anemia/downtrending Hgb makes anticoagulation for atrial fibrillation high risk, despite elevated chadsvasc score.  She remains very ill, with multiple comorbid conditions. Complex medical decision making.  For questions or updates, please contact [place] Please consult [email or web address] for contact info under        Signed, Buford Dresser, MD  04/25/2020, 10:12 AM

## 2020-04-25 NOTE — Progress Notes (Signed)
NAME:  Martha Ortega, MRN:  [id number], DOB:  1934/05/27, LOS: 1 ADMISSION DATE:  04/18/2020, CONSULTATION DATE: 03/27/2020 REFERRING MD: Dr. Irine Seal, CHIEF COMPLAINT: Abdominal pain  Brief History:  86 year old with chronic diverticulitis with bowel obstruction from sigmoid stricture that failed medical management s/p ex lap, total colectomy, end ileostomy 12/29 who was hypotensive post-op.   History of Present Illness:  Admitted 12/21 with LLQ pain and bloody stool. CT abdomen/pelivs on admission with diverticulitis of the distal descending colon complicated by a sinus tract and 18 mm collection in the low pericolic gutter. Managed conservatively with NG tube. Liquid diet, IVF. Repeat CT AP 12/25 appeared mildly worse with wall thickening, no frank perforation. Was not taking PO so taken to OR 12/29 for surgery. Now hypotensive despite IVF boluses.   Past Medical History:  Hypertension Diverticulitis  Significant Hospital Events:  Exploratory laparotomy 12/29 Started TPN 04/24/2020 Consults:  General surgery PCCM  Procedures:  Ex lap, total colectomy and end ileostomy 12/29  Significant Diagnostic Tests:  CT abdomen 12/29  Micro Data:  12/2p UCx, BcX x 2 pending  Antimicrobials:  Zosyn 12/26>> Eraxis 12/31>> Vancomycin 12/31>>  Interim History / Subjective:  No overnight events Slept a little bit better Pain adequately controlled  Objective   Blood pressure (!) 132/58, pulse 88, temperature 98.8 F (37.1 C), temperature source Oral, resp. rate (!) 22, height 5\' 2"  (1.575 m), weight 69.3 kg, SpO2 97 %. CVP:  [10 mmHg-15 mmHg] 14 mmHg      Intake/Output Summary (Last 24 hours) at 04/25/2020 0956 Last data filed at 04/25/2020 0800 Gross per 24 hour  Intake 4292.87 ml  Output 245 ml  Net 4047.87 ml   Filed Weights   03/27/2020 1333 04/23/20 1036 04/24/20 0500  Weight: 59.9 kg 67.9 kg 69.3 kg    Examination: General: Frail, comfortable HENT: Moist  oral mucosa Lungs: Decreased air movement bilaterally, clear Cardiovascular: S1-S2 appreciated Abdomen: Postsurgical changes, ostomy in place with some output Extremities: Warm and dry Neuro: Alert and oriented, moving all extremities GU: Fair output  Resolved Hospital Problem list     Assessment & Plan:  Septic shock Hypovolemic shock -Secondary to intra-abdominal sepsis -Continue antibiotics including vancomycin, antifungal, Zosyn -Required pressors to be resumed on 04/24/2020  New atrial fibrillation with RVR -Decompensated with beta-blockers -Currently on amiodarone  Pneumoperitoneum -Postoperative changes Continue antibiotics and antifungal  Acute kidney injury -Maintain normotension -Continue to trend BUN/creatinine -Fluid resuscitation -Avoid nephrotoxic's -Renal dose medications  Bowel obstruction due to sigmoid stricture and contained perforation History of diverticulosis -Ileus appears to be resolving -Continue TPN at present  Acute blood loss anemia -Transfuse per protocol  Still quite frail Risk of decompensation remains high Continue aggressive support  Best practice (evaluated daily)  Diet: N.p.o. Pain/Anxiety/Delirium protocol (if indicated): Tylenol, morphine as needed VAP protocol (if indicated): Not indicated DVT prophylaxis: SCD GI prophylaxis: Pepcid Glucose control: SSI Mobility: Bedrest Disposition: ICU  Goals of Care:  Last date of multidisciplinary goals of care discussion: Family and staff present: Family member at bedside Summary of discussion:  Follow up goals of care discussion due:  Code Status: Full code  Labs   CBC: Recent Labs  Lab 03/27/2020 0442 04/23/2020 2234 04/23/20 0251 04/24/20 0231 04/25/20 0442  WBC 14.2* 8.4 12.3* 17.9* 22.9*  NEUTROABS 12.7* 7.4 11.2* 16.1* 20.3*  HGB 7.7* 9.5* 9.6* 8.3* 7.9*  HCT 23.6* 28.5* 28.5* 25.3* 23.3*  MCV 81.9 87.4 86.1 86.6 85.3  PLT 514* 411*  399 330 294    Basic  Metabolic Panel: Recent Labs  Lab 04/19/20 0505 04/20/20 0417 04/21/20 0549 04/23/2020 0442 04/20/2020 2234 04/23/20 0251 04/24/20 0231 04/25/20 0442  NA 140 139   < > 133* 137 135 137 133*  134*  K 4.2 3.9   < > 4.3 4.1 4.1 4.3 3.9  3.9  CL 110 109   < > 104 109 106 101 95*  96*  CO2 20* 19*   < > 18* 17* 17* 20* 22  24  GLUCOSE 109* 104*   < > 142* 99 95 107* 243*  245*  BUN 13 15   < > 25* 24* 25* 32* 38*  40*  CREATININE 2.04* 1.96*   < > 2.50* 2.53* 2.54* 3.48* 3.98*  3.82*  CALCIUM 8.4* 8.2*   < > 8.1* 7.4* 7.5* 7.3* 7.3*  7.3*  MG 1.7 1.8  --   --   --  1.6* 3.1* 2.7*  PHOS  --   --   --   --   --  3.9 4.5 3.9  4.2   < > = values in this interval not displayed.   GFR: Estimated Creatinine Clearance: 10.2 mL/min (A) (by C-G formula based on SCr of 3.82 mg/dL (H)). Recent Labs  Lab 04/24/2020 2234 04/23/20 0023 04/23/20 0251 04/24/20 0231 04/25/20 0442  PROCALCITON 33.28  --  38.94 51.24 38.17  WBC 8.4  --  12.3* 17.9* 22.9*  LATICACIDVEN 2.5* 1.8  --   --   --     Liver Function Tests: Recent Labs  Lab 04/05/2020 2234 04/23/20 0251 04/24/20 0231 04/25/20 0442  AST 33  --   --  57*  ALT 14  --   --  23  ALKPHOS 30*  --   --  83  BILITOT 0.9  --   --  0.8  PROT 4.0*  --   --  4.9*  ALBUMIN 1.8* 2.4* 2.0* 2.0*  2.0*   No results for input(s): LIPASE, AMYLASE in the last 168 hours. No results for input(s): AMMONIA in the last 168 hours.  ABG No results found for: PHART, PCO2ART, PO2ART, HCO3, TCO2, ACIDBASEDEF, O2SAT   Coagulation Profile: No results for input(s): INR, PROTIME in the last 168 hours.  Cardiac Enzymes: No results for input(s): CKTOTAL, CKMB, CKMBINDEX, TROPONINI in the last 168 hours.  HbA1C: Hgb A1c MFr Bld  Date/Time Value Ref Range Status  04/23/2020 02:51 AM 5.2 4.8 - 5.6 % Final    Comment:    (NOTE) Pre diabetes:          5.7%-6.4%  Diabetes:              >6.4%  Glycemic control for   <7.0% adults with diabetes    11/25/2010 09:33 AM 5.5 4.6 - 6.5 % Final    Comment:    Glycemic Control Guidelines for People with Diabetes:Non Diabetic:  <6%Goal of Therapy: <7%Additional Action Suggested:  >8%     CBG: Recent Labs  Lab 04/24/20 2032 04/24/20 2316 04/25/20 0400 04/25/20 0754  GLUCAP 159* 180* 230* 240*    Review of Systems:   Some abdominal discomfort  Past Medical History:  She,  has a past medical history of Abnormal blood findings, Allergic rhinitis, History of diverticulitis of colon, Hyperglycemia (09), Hyperlipidemia, Hypertension, and Stroke (cerebrum) ([place]).   Surgical History:   Past Surgical History:  Procedure Laterality Date  . ABDOMINAL HYSTERECTOMY    . APPENDECTOMY  Patient states not sure if this is true  . barium swallow  10/18/03  . CARDIOVASCULAR STRESS TEST  07/21/04  . COLON RESECTION N/A 04/07/2020   Procedure: TOTAL ABDOMINAL COLECTOMY WITH ILEOSTOMY REPAIR OF STOMACH INJURY;  Surgeon: Kieth Brightly, Arta Bruce, MD;  Location: [facility];  Service: General;  Laterality: N/A;  . CYSTECTOMY  93   R axilla  . CYSTECTOMY  97   R breast     Social History:   reports that she quit smoking about 42 years ago. She has never used smokeless tobacco. She reports that she does not drink alcohol and does not use drugs.   Family History:  Her family history includes Arthritis in her mother and sister; Diabetes in her sister; Hypertension in her father; Lupus in her sister; Prostate cancer in her brother; Stroke in her father.   Allergies Allergies  Allergen Reactions  . Amlodipine Swelling    Throat and mouth swelling  . Fluticasone Propionate     REACTION: does not help  . Loratadine     REACTION: does not work  . Lovastatin     REACTION: was not effective  . Omeprazole Swelling    Throat, lips, and mouth swelling  . Zocor [Simvastatin - High Dose]     Mouth and throat swelled    The patient is critically ill with multiple organ systems failure and requires high  complexity decision making for assessment and support, frequent evaluation and titration of therapies, application of advanced monitoring technologies and extensive interpretation of multiple databases. Critical Care Time devoted to patient care services described in this note independent of APP/resident time (if applicable)  is 31 minutes.   Sherrilyn Rist MD [facility] Personal pager: [phone] If unanswered, please page CCM On-call: [phone]

## 2020-04-25 NOTE — Progress Notes (Signed)
Subjective On levo. She denies n/v with NG in place. She has begun to have ileostomy output. Pain well controlled. Reports she slept well last night.  Objective: Vital signs in last 24 hours: Temp:  [97.5 F (36.4 C)-98.5 F (36.9 C)] 97.6 F (36.4 C) (01/01 0402) Pulse Rate:  [67-140] 95 (01/01 0615) Resp:  [11-28] 27 (01/01 0615) BP: (65-159)/(32-98) 129/77 (01/01 0615) SpO2:  [88 %-100 %] 98 % (01/01 0615) Last BM Date: 04/17/20  Intake/Output from previous day: 12/31 0701 - 01/01 0700 In: 4292.9 [I.V.:3483.8; IV Piggyback:809.1] Out: 20 [Stool:20] Intake/Output this shift: Total I/O In: 3332.1 [I.V.:3232.1; IV Piggyback:100] Out: -   Gen: NAD, comfortable CV: tachycardic rate, regular rhythm Pulm: Normal work of breathing Abd: Soft, minimally ttp; nondistended; ileostomy pink with thick green succus, JP serosang; NG bilious Ext: SCDs in place   Lab Results: CBC  Recent Labs    04/24/20 0231 04/25/20 0442  WBC 17.9* 22.9*  HGB 8.3* 7.9*  HCT 25.3* 23.3*  PLT 330 294   BMET Recent Labs    04/24/20 0231 04/25/20 0442  NA 137 133*  134*  K 4.3 3.9  3.9  CL 101 95*  96*  CO2 20* 22  24  GLUCOSE 107* 243*  245*  BUN 32* 38*  40*  CREATININE 3.48* 3.98*  3.82*  CALCIUM 7.3* 7.3*  7.3*   PT/INR No results for input(s): LABPROT, INR in the last 72 hours. ABG No results for input(s): PHART, HCO3 in the last 72 hours.  Invalid input(s): PCO2, PO2  Studies/Results:  Anti-infectives: Anti-infectives (From admission, onward)   Start     Dose/Rate Route Frequency Ordered Stop   04/25/20 1000  anidulafungin (ERAXIS) 100 mg in sodium chloride 0.9 % 100 mL IVPB        100 mg 78 mL/hr over 100 Minutes Intravenous Every 24 hours 04/24/20 0956     04/24/20 1130  vancomycin (VANCOREADY) IVPB 1500 mg/300 mL        1,500 mg 150 mL/hr over 120 Minutes Intravenous  Once 04/24/20 1038 04/24/20 1440   04/24/20 1100  anidulafungin (ERAXIS) 200 mg in sodium  chloride 0.9 % 200 mL IVPB        200 mg 78 mL/hr over 200 Minutes Intravenous  Once 04/24/20 0958 04/24/20 1515   04/24/20 1046  vancomycin variable dose per unstable renal function (pharmacist dosing)         Does not apply See admin instructions 04/24/20 1046     03/31/2020 1200  piperacillin-tazobactam (ZOSYN) IVPB 2.25 g        2.25 g 100 mL/hr over 30 Minutes Intravenous Every 8 hours 03/29/2020 1138         Assessment/Plan: Patient Active Problem List   Diagnosis Date Noted  . Severe sepsis with septic shock ([place]) 04/23/2020  . Postoperative hypovolemic shock 04/23/2020  . Pneumoperitoneum 04/23/2020  . Renal failure (ARF), acute on chronic ([place]) 04/23/2020  . Elevated troponin 04/23/2020  . Ureter injury   . Large bowel obstruction ([place])   . Cerebrovascular accident (CVA) ([place])   . Hypomagnesemia   . Hypokalemia   . Sepsis ([place])   . Pericolonic abscess due to diverticulitis 04/21/2020  . Diverticulitis of sigmoid colon with abscess 03/25/2020  . Aortic atherosclerosis ([place]) 04/21/2020  . ABLA (acute blood loss anemia) 04/03/2020  . Blood in stool   . Abdominal pain 04/06/2020  . CKD (chronic kidney disease), stage IV ([place]) 04/06/2020  . Mild protein-calorie malnutrition ([place])  04/06/2020  . Osteopenia 07/26/2015  . Estrogen deficiency 06/23/2015  . History of CVA (cerebrovascular accident) 03/02/2015  . Encounter for Medicare annual wellness exam 09/23/2013  . Spinal stenosis of lumbar region 09/23/2013  . GERD (gastroesophageal reflux disease) 12/12/2011  . Renal insufficiency 11/26/2008  . ALLERGIC RHINITIS 09/10/2007  . Hyperlipidemia 09/08/2006  . Essential hypertension 09/08/2006  . POSTNASAL DRIP SYNDROME 09/08/2006   s/p Procedure(s): TOTAL ABDOMINAL COLECTOMY WITH END ILEOSTOMY; GASTRIC REPAIR 04/15/2020  -Renal US 12/30 showed no hydronephrosis -Oliguria, Cr up trending - 3.98; jp remains low volume. Nephrology following with support, bicarb. -Possible  ACS vs demand ischemia noted - as per cardiology -Cont JP drain to bulb suction -NPO, MIVF, NGT to low intermittent suction today; once ostomy output reliably picking up, will consider clamping trials -TPN - anticipate she will have protracted ileus and additionally has had many days with very poor appetite -PPx: SCDs, ok for chemical dvt ppx from our standpoint   LOS: 11 days   Sharon Mt. Dema Severin, M.D. [facility] Use [email or web address] to contact on call provider

## 2020-04-25 NOTE — Progress Notes (Signed)
PHARMACY - TOTAL PARENTERAL NUTRITION CONSULT NOTE   Indication: prolonged ileus  Patient Measurements: Height: 5\' 2"  (157.5 cm) Weight: 69.3 kg (152 lb 12.5 oz) IBW/kg (Calculated) : 50.1 TPN AdjBW (KG): 59.9 Body mass index is 27.94 kg/m.  Assessment:  86 y/o F with a h/o CKD IV admitted with sigmoid stricture s/p colectomy with end ileostomy. Surgery expects protracted ileus in addition to the fact that patient has gone many days with poor appetite even prior to admission. Pharmacy consulted to initiate TPN.   Glucose / Insulin: CBGs 180- 240, 6 units insulin from start of TPN Electrolytes: Mag elevated but improved, Cl dec, Bicarb incr, Na sl low, K wnl Renal: SCr elevated and rising LFTs / TGs: LFTs ~ wnl (AST sl incr), TG 132 (1/1) Prealbumin / albumin: Alb 2.0, Pre-Alb < 5 (1/1) Intake / Output; MIVF: NPO/ oliguric; Bicarb drip d/c, current LR now discontinued  GI Imaging:  CT abdomen pelvis 12/30: 1. Status post recent total colectomy with multifocal pneumoperitoneum throughout the abdomen. The greatest concentration of free air is in the right upper quadrant. This is favored to be post-operative. 2. Dilated gallbladder is nonspecific in this setting. 3. Intermediate sized right and small left pleural effusions with right basilar consolidation. Surgeries / Procedures: 12/29 colectomy with end ileostomy  Central access:  Triple lumen CVC placed 12/31 TPN start date: 12/31   Nutritional Goals (per RD recommendation on pending): 12/31: kCal: 1700-1950 , Protein: 90-105, Fluid: 1.5L/day (total fluid rate ~ 65 ml/hr) Goal TPN rate 70 mL/hr (provides 84 g of protein and 1670  kcals per day)  Current Nutrition:  NPO  Plan:   Continue TPN at 40 mL/hr  Electrolytes in TPN: 53mEq/L of Na, 88mEq/L of K, 20mEq/L of Ca, 2.86mEq/L of Mg, and 27mmol/L of Phos. Cl:Ac ratio 1:1 Add standard MVI and trace elements to TPN Initiate very sensitive q4h SSI and adjust as needed   LR  discontinued Monitor TPN labs on Mon/Thurs BMET, Mag, Phos in am  Minda Ditto PharmD 04/25/2020,7:07 AM

## 2020-04-25 NOTE — Progress Notes (Addendum)
PROGRESS NOTE    Martha Ortega  ONG:[id number] DOB: 12/16/1934 DOA: 04/24/2020 PCP: [name], MD    Chief Complaint  Patient presents with  . Blood In Stools  . Abdominal Pain    Brief Narrative:  Patient is 86 year old female with history of nonhemorrhagic CVA, CKD stage IV, hypertension, hyperlipidemia, diverticulitis, status post appendicectomy and hysterectomy who presented to the emergency room with 2 weeks history of intermittent left lower quadrant pain, blood in the stool.  She was started on Augmentin as an outpatient for presumed diverticulitis by her PCP but it did not help.  On presentation, she was tachycardic and tachypneic, had leukocytosis, lactic acidosis.  Imaging showed recurrent descending colon diverticulitis complicated by sinus tract and collection in the lower paracolic catheter.  She was started on IV antibiotics, GI and general surgery consulted.  She had a prolonged hospital course was persistent abdominal pain, no bowel movement.    Assessment & Plan:   Principal Problem:   Severe sepsis with septic shock (HCC) Active Problems:   Postoperative hypovolemic shock   Pneumoperitoneum   Hyperlipidemia   CKD (chronic kidney disease), stage IV (HCC)   Diverticulitis of sigmoid colon with abscess   Aortic atherosclerosis (HCC)   Blood in stool   ABLA (acute blood loss anemia)   Pericolonic abscess due to diverticulitis   Large bowel obstruction (HCC)   Cerebrovascular accident (CVA) ([place])   Hypomagnesemia   Hypokalemia   Sepsis ([place])   Renal failure (ARF), acute on chronic (HCC)   Elevated troponin   Ureter injury  1 severe sepsis with septic shock and hypovolemic shock Patient noted postop to go into septic shock and hypovolemic shock with minimal urine output, elevated lactic acid levels which are trended down.  Patient noted to be tachycardic with a leukocytosis.  Patient with poor oral intake.  Patient pancultured results pending.  Repeat  CT abdomen and pelvis done concerning for pneumoperitoneum which per general surgery is expected.  Patient received 2 to 3 L bolus however still hypotensive and subsequently started on Levophed drip.  Blood pressure improving on pressors.  Patient with a worsening procalcitonin levels.  Leukocytosis worsening.  Status post IV albumin.  IV antibiotic was broadened to IV vancomycin, IV Eraxis, IV Zosyn (04/24/2020 ).  May need repeat CT abdomen and pelvis.  PCCM, general surgery following.  2.  Pneumoperitoneum Per general surgery suspected postop changes.  Patient however with elevated procalcitonin level.  Worsening leukocytosis despite broadened antibiotic coverage of IV vancomycin, IV Eraxis, IV Zosyn.  May need repeat CT abdomen and pelvis. Per general surgery.  3. sepsis likely secondary to recurrent diverticulitis/sigmoid stricture, POA Patient met criteria for sepsis with tachycardia, tachypnea, leukocytosis.  Patient pancultured with cultures pending.  Patient with a worsening procalcitonin level.  Leukocytosis continues to worsen.  IV antibiotic coverage was broadened 04/24/2020 to include IV vancomycin, IV Eraxis, IV Zosyn.  May need repeat CT abdomen and pelvis.  PCCM following.  General surgery following.  4.  Recurrent diverticulitis with sigmoid stricture and bowel obstruction Complicated by sinus tract and fluid collections in the lower paracolic gutter.  Repeat CT abdomen and pelvis done April 18, 2020 with sigmoid diverticulosis with marked wall thickening and pericolic inflammatory changes of the proximal sigmoid colon, no evidence of abscess or free intraperitoneal perforation.  Leukocytosis trended down.  Patient noted not to have had a bowel movement in several days despite MiraLAX and suppository.  Patient underwent abdominal films with residual contrast  within the colon proximal to the sigmoid reflecting a degree of persistent obstruction, distention of the cecum to 9.7 cm.  Patient  seen by general surgery this morning was taken to the OR where she underwent total abdominal colectomy with end ileostomy, repair of stomach injury secondary to sigmoid stricture, dilated proximal colon with contained perforation per Dr. Kieth Brightly (04/02/2020).  Patient on IV Zosyn, IV vancomycin, IV Eraxis..  Patient currently in stepdown unit.  Patient noted to have developed shock the night of 04/06/2020, currently requiring pressors with elevated procalcitonin levels.  Procalcitonin levels still elevated.  Leukocytosis worsening.  Patient was on IV Zosyn and antibiotic coverage broadened with IV vancomycin and IV Eraxis per PCCM.  Repeat cultures pending.  If leukocytosis continues to worsen may need repeat CT abdomen and pelvis.  General surgery following.    5.  Entrapment of left ureter Patient noted to have possible left ureteral injury with entrapment of left ureter.  Urology was consulted during surgery and patient underwent left ureteral exploration, left ureterolysis per Dr. Diona Fanti 04/20/2020.  Per urology.  6.  Hypokalemia/hypomagnesemia Electrolytes repleted.  Potassium at 3.9.  Phosphorus at 3.9.  Magnesium noted to be 2.7.  Follow.   7.  Normocytic anemia/suspected GI bleed/postop acute blood loss anemia Hemoglobin noted to be in the range of 7 which has remained stable.  Anemia panel done with low iron patient status post IV iron infusion.  Patient noted to have had hematochezia at home likely associated with acute recurrent diverticulitis.  GI was consulted during this hospitalization and no plans for intervention at this time.  Hemoglobin slowly trickling down currently at 7.9 from 8.3.  Transfusion threshold hemoglobin <7.  Outpatient follow-up with GI.  8.  Hypertension Patient noted to be hypotensive during his hospitalization currently requiring pressors.  Currently n.p.o. antihypertensive medications on hold.  Pressors temporarily stopped yesterday however patient went into A.  fib, given a small dose of IV Lopressor and became hypotensive necessitating resumption of pressors.  Pressors discontinued 45 minutes ago.  PCCM following.  9.  History of nonhemorrhagic CVA Noted to be on aspirin and statin at home prior to admission.  Aspirin on hold due to concern for possible GI bleed.  Patient currently n.p.o. statin on hold and likely resume when tolerating oral intake.   10.  Acute on chronic kidney disease stage IV/acidosis Patient with worsening renal function with creatinine now up to 3.98 from 3.48 from  2.54 from 2.09 on admission.  Baseline creatinine 1.7-2.  Likely a prerenal azotemia as patient noted to have developed shock postoperatively requiring pressors.  Urinalysis protein negative, nitrite negative, leukocytes negative with a specific gravity of 1.019.  Urine sodium < 10.  Urine creatinine 122.11.  Renal ultrasound negative for hydronephrosis.  Patient with minimal urine output recorded.  Patient seen in consultation by nephrology who feel acute kidney injury likely due to postop shock/hypoperfusion.  Per nephrology patient with > 10kg up in weight.  IV fluids decreased to 40 cc/h.  Patient noted to have been started on LR.  Per RN bicarb drip discontinued per nephrology.  Cozaar on hold. ??  IV Lasix however will defer to nephrology.  Appreciate nephrology input and recommendations.   11.  Elevated troponin Likely demand ischemia postoperatively as patient noted to have developed shock.  Patient denies any acute chest pain.  EKG done with a sinus tachycardia.  2D echo done preoperatively with 60 to 65%, no wall motion abnormalities, grade 1 diastolic dysfunction.  As patient developed shock.  Patient seen in consultation by cardiology who are in agreement that likely demand ischemia.  Cardiology recommended discontinuation of IV metoprolol and do not recommend starting IV heparin.  Cardiology following.  12.  Postoperative atrial fibrillation CHA2DS2VASC score  6 Likely secondary to acute illness.  Patient noted to go into A. fib yesterday.  Patient given IV Lopressor however blood pressure dropped and patient had to be placed back on pressors.  Patient now on amiodarone drip.  Blood pressure stable.  Hemoglobin is slowly trickling down/anemia.  Likely unable to place on anticoagulation at this time.  Cardiology following.    DVT prophylaxis: SCDs Code Status: Full Family Communication: Updated patient.  No family at bedside.  Disposition:   Status is: Inpatient    Dispo: The patient is from: Home              Anticipated d/c is to: To be determined              Anticipated d/c date is: To be determined to              Patient currently postop, noted to develop hypotension and shock, currently in stepdown unit, was on IV pressors, on IV amiodarone, worsening renal function, worsening infection markers.  Not stable for discharge.        Consultants:   Gastroenterology: Dr. Havery Moros 04/13/2020  General surgery: Dr. Redmond Pulling 04/18/2020  PCCM: Dr. Silas Flood 04/23/2020  Cardiology: Dr. Harrell Gave 04/23/2020  Nephrology: Dr.Schertz 04/23/2020  Procedures:   CT abdomen and pelvis 04/01/2020, 04/18/2020, 04/23/2020  Abdominal films 04/17/2020  2 D ECHO 04/21/2020  Total abd colectomy with end ileostomy, repair of stomach injury --per Dr Kieth Brightly 04/05/2020  Left ureteral exploration and left ureterolysis --Per urology Dr Diona Fanti 04/21/2020  Renal ultrasound 04/23/2020  Insertion of central venous catheter with ultrasound guidance per Dr.Olalere 04/24/2020  Antimicrobials:   IV Zosyn 04/19/2020>>>>  IV Eraxis 04/24/2020>>>>  IV vancomycin 04/24/2020>>>>   Subjective: Patient laying in bed.  States just does not feel too well.  Denies any chest pain.  No shortness of breath.  Denies any significant abdominal pain.  Noted to go into A. fib with RVR yesterday currently on amiodarone drip.  Levophed just turned off 45  minutes ago.  Objective: Vitals:   04/25/20 0615 04/25/20 0800 04/25/20 0829 04/25/20 0900  BP: 129/77 (!) 142/93  (!) 132/58  Pulse: 95 96  88  Resp: (!) 27 (!) 25  (!) 22  Temp:   98.8 F (37.1 C)   TempSrc:   Oral   SpO2: 98% 96%  97%  Weight:      Height:        Intake/Output Summary (Last 24 hours) at 04/25/2020 0938 Last data filed at 04/25/2020 0800 Gross per 24 hour  Intake 4292.87 ml  Output 245 ml  Net 4047.87 ml   Filed Weights   04/13/2020 1333 04/23/20 1036 04/24/20 0500  Weight: 59.9 kg 67.9 kg 69.3 kg    Examination:  General exam: In stepdown unit.  Alert.  NG tube in place.  Central line in place anterior right chest. Respiratory system: Lungs clear to auscultation bilaterally.  No wheezes, no crackles, no rhonchi.  Normal respiratory effort. Cardiovascular system: Irregularly irregular.  No JVD.  No murmurs rubs or gallops.  1+ bilateral lower extremity edema.  Gastrointestinal system: Abdomen is soft, nondistended, JP drain in place, ileostomy with minimal dark fluid.  Hypoactive bowel sounds.  Central nervous system:  Alert.  Moving extremities spontaneously.  No focal neurological deficits.  Extremities: Symmetric 5 x 5 power. Skin: No rashes, lesions or ulcers Psychiatry: Judgement and insight fair.  Mood and affect appropriate.    Data Reviewed: I have personally reviewed following labs and imaging studies  CBC: Recent Labs  Lab 04/03/2020 0442 04/19/2020 2234 04/23/20 0251 04/24/20 0231 04/25/20 0442  WBC 14.2* 8.4 12.3* 17.9* 22.9*  NEUTROABS 12.7* 7.4 11.2* 16.1* 20.3*  HGB 7.7* 9.5* 9.6* 8.3* 7.9*  HCT 23.6* 28.5* 28.5* 25.3* 23.3*  MCV 81.9 87.4 86.1 86.6 85.3  PLT 514* 411* 399 330 092    Basic Metabolic Panel: Recent Labs  Lab 04/19/20 0505 04/20/20 0417 04/21/20 0549 04/13/2020 0442 04/09/2020 2234 04/23/20 0251 04/24/20 0231 04/25/20 0442  NA 140 139   < > 133* 137 135 137 133*  134*  K 4.2 3.9   < > 4.3 4.1 4.1 4.3 3.9  3.9   CL 110 109   < > 104 109 106 101 95*  96*  CO2 20* 19*   < > 18* 17* 17* 20* 22  24  GLUCOSE 109* 104*   < > 142* 99 95 107* 243*  245*  BUN 13 15   < > 25* 24* 25* 32* 38*  40*  CREATININE 2.04* 1.96*   < > 2.50* 2.53* 2.54* 3.48* 3.98*  3.82*  CALCIUM 8.4* 8.2*   < > 8.1* 7.4* 7.5* 7.3* 7.3*  7.3*  MG 1.7 1.8  --   --   --  1.6* 3.1* 2.7*  PHOS  --   --   --   --   --  3.9 4.5 3.9  4.2   < > = values in this interval not displayed.    GFR: Estimated Creatinine Clearance: 10.2 mL/min (A) (by C-G formula based on SCr of 3.82 mg/dL (H)).  Liver Function Tests: Recent Labs  Lab 03/31/2020 2234 04/23/20 0251 04/24/20 0231 04/25/20 0442  AST 33  --   --  57*  ALT 14  --   --  23  ALKPHOS 30*  --   --  83  BILITOT 0.9  --   --  0.8  PROT 4.0*  --   --  4.9*  ALBUMIN 1.8* 2.4* 2.0* 2.0*  2.0*    CBG: Recent Labs  Lab 04/24/20 2032 04/24/20 2316 04/25/20 0400 04/25/20 0754  GLUCAP 159* 180* 230* 240*     Recent Results (from the past 240 hour(s))  MRSA PCR Screening     Status: None   Collection Time: 03/25/2020  7:50 PM   Specimen: Nasopharyngeal  Result Value Ref Range Status   MRSA by PCR NEGATIVE NEGATIVE Final    Comment:        The GeneXpert MRSA Assay (FDA approved for NASAL specimens only), is one component of a comprehensive MRSA colonization surveillance program. It is not intended to diagnose MRSA infection nor to guide or monitor treatment for MRSA infections. Performed at [facility], [place] [address] [place], [place] [address]   Culture, Urine     Status: None   Collection Time: 03/31/2020  9:48 PM   Specimen: Urine, Catheterized  Result Value Ref Range Status   Specimen Description   Final    URINE, CATHETERIZED Performed at [facility] [address], [place], [place] [address]    Special Requests   Final    NONE Performed at [facility], [address] [place], [place]  [address]    Culture   Final    NO GROWTH Performed at [facility], [place] [address] [place], [place] [address]    Report Status 04/24/2020 FINAL  Final  Culture, blood (routine x 2)     Status: None (Preliminary result)   Collection Time: 04/13/2020 10:34 PM   Specimen: BLOOD  Result Value Ref Range Status   Specimen Description   Final    BLOOD BLOOD RIGHT ARM Performed at [facility] [address], [place], [place] [address]    Special Requests   Final    BOTTLES DRAWN AEROBIC AND ANAEROBIC Blood Culture adequate volume Performed at [facility] [address] [place], [place] [address]    Culture   Final    NO GROWTH 2 DAYS Performed at [facility] [address], [place], [place] [address]    Report Status PENDING  Incomplete  Culture, blood (routine x 2)     Status: None (Preliminary result)   Collection Time: 03/28/2020 10:34 PM   Specimen: BLOOD  Result Value Ref Range Status   Specimen Description   Final    BLOOD BLOOD LEFT HAND Performed at [facility] [address], [place], [place] [address]    Special Requests   Final    BOTTLES DRAWN AEROBIC ONLY Blood Culture adequate volume Performed at [facility] [address] [place], [place] [address]    Culture   Final    NO GROWTH 2 DAYS Performed at [place] [address], [place],  [address]    Report Status PENDING  Incomplete         Radiology Studies: DG Abd 1 View  Result Date: 04/25/2020 CLINICAL DATA:  Orogastric tube placement EXAM: ABDOMEN - 1 VIEW COMPARISON:  None. FINDINGS: Tip and side port of the orogastric tube project within the stomach. No visible dilated bowel. IMPRESSION: Orogastric tube tip in the stomach. Electronically Signed   By: Ulyses Jarred M.D.   On: 04/25/2020 03:06   DG CHEST PORT 1 VIEW  Result Date: 04/24/2020 CLINICAL DATA:  New central line placement. EXAM:  PORTABLE CHEST 1 VIEW COMPARISON:  Radiograph 2 days ago 04/15/2020 FINDINGS: New right subclavian central line tip at the atrial caval junction. There is no pneumothorax. Tip and side port of the enteric tube below the diaphragm in the stomach. Hazy bilateral lung base opacities, right greater than left, likely combination of pleural fluid and atelectasis. Vascular congestion. Heart is normal in size. Aortic atherosclerosis. Similar or improving right upper quadrant free air to prior abdominal CT, likely postsurgical. IMPRESSION: 1. Right subclavian central line with tip at the atrial caval junction. No pneumothorax. 2. Hazy bilateral lung base opacities, right greater than left, likely combination of pleural fluid and atelectasis. Vascular congestion. Electronically Signed   By: Keith Rake M.D.   On: 04/24/2020 16:25        Scheduled Meds: . bisacodyl  10 mg Rectal Daily  . chlorhexidine  15 mL Mouth Rinse BID  . Chlorhexidine Gluconate Cloth  6 each Topical Daily  . insulin aspart  0-6 Units Subcutaneous Q4H  . lip balm  1 application Topical BID  . mouth rinse  15 mL Mouth Rinse q12n4p  . vancomycin variable dose per unstable renal function (pharmacist dosing)   Does not apply See admin instructions   Continuous Infusions: . sodium chloride Stopped (04/24/20 1705)  .  amiodarone 30 mg/hr (04/25/20 0617)  . anidulafungin 100 mg (04/25/20 0934)  . famotidine (PEPCID) IV Stopped (04/24/20 1001)  . lactated ringers 75 mL/hr at 04/25/20 0617  . methocarbamol (ROBAXIN) IV Stopped (04/21/2020 2300)  . norepinephrine (LEVOPHED) Adult infusion 6 mcg/min (04/25/20 0617)  . piperacillin-tazobactam Stopped (04/25/20 0509)  . TPN ADULT (ION) 40 mL/hr at 04/25/20 0617     LOS: 11 days    Time spent: 40 minutes    Irine Seal, MD [facility]   To contact the attending provider between 7A-7P or the covering provider during after hours 7P-7A, please log into the web site  [email or web address] and access using universal [place] password for that web site. If you do not have the password, please call the hospital operator.  04/25/2020, 9:38 AM

## 2020-04-25 DEATH — deceased

## 2020-04-26 ENCOUNTER — Encounter (HOSPITAL_COMMUNITY): Payer: Self-pay | Admitting: Internal Medicine

## 2020-04-26 DIAGNOSIS — N179 Acute kidney failure, unspecified: Secondary | ICD-10-CM | POA: Diagnosis not present

## 2020-04-26 DIAGNOSIS — A419 Sepsis, unspecified organism: Secondary | ICD-10-CM | POA: Diagnosis not present

## 2020-04-26 DIAGNOSIS — I480 Paroxysmal atrial fibrillation: Secondary | ICD-10-CM | POA: Diagnosis not present

## 2020-04-26 DIAGNOSIS — R6521 Severe sepsis with septic shock: Secondary | ICD-10-CM | POA: Diagnosis not present

## 2020-04-26 DIAGNOSIS — R778 Other specified abnormalities of plasma proteins: Secondary | ICD-10-CM | POA: Diagnosis not present

## 2020-04-26 DIAGNOSIS — N184 Chronic kidney disease, stage 4 (severe): Secondary | ICD-10-CM | POA: Diagnosis not present

## 2020-04-26 LAB — PHOSPHORUS: Phosphorus: 3.8 mg/dL (ref 2.5–4.6)

## 2020-04-26 LAB — CBC WITH DIFFERENTIAL/PLATELET
Abs Immature Granulocytes: 0.4 10*3/uL — ABNORMAL HIGH (ref 0.00–0.07)
Basophils Absolute: 0.1 10*3/uL (ref 0.0–0.1)
Basophils Relative: 0 %
Eosinophils Absolute: 0.1 10*3/uL (ref 0.0–0.5)
Eosinophils Relative: 1 %
HCT: 22.6 % — ABNORMAL LOW (ref 36.0–46.0)
Hemoglobin: 7.8 g/dL — ABNORMAL LOW (ref 12.0–15.0)
Immature Granulocytes: 2 %
Lymphocytes Relative: 5 %
Lymphs Abs: 1.1 10*3/uL (ref 0.7–4.0)
MCH: 28.8 pg (ref 26.0–34.0)
MCHC: 34.5 g/dL (ref 30.0–36.0)
MCV: 83.4 fL (ref 80.0–100.0)
Monocytes Absolute: 1.3 10*3/uL — ABNORMAL HIGH (ref 0.1–1.0)
Monocytes Relative: 6 %
Neutro Abs: 19.7 10*3/uL — ABNORMAL HIGH (ref 1.7–7.7)
Neutrophils Relative %: 86 %
Platelets: 265 10*3/uL (ref 150–400)
RBC: 2.71 MIL/uL — ABNORMAL LOW (ref 3.87–5.11)
RDW: 18.3 % — ABNORMAL HIGH (ref 11.5–15.5)
WBC: 22.7 10*3/uL — ABNORMAL HIGH (ref 4.0–10.5)
nRBC: 0.7 % — ABNORMAL HIGH (ref 0.0–0.2)

## 2020-04-26 LAB — LACTIC ACID, PLASMA: Lactic Acid, Venous: 1.3 mmol/L (ref 0.5–1.9)

## 2020-04-26 LAB — RENAL FUNCTION PANEL
Albumin: 1.7 g/dL — ABNORMAL LOW (ref 3.5–5.0)
Anion gap: 13 (ref 5–15)
BUN: 51 mg/dL — ABNORMAL HIGH (ref 8–23)
CO2: 24 mmol/L (ref 22–32)
Calcium: 7.6 mg/dL — ABNORMAL LOW (ref 8.9–10.3)
Chloride: 94 mmol/L — ABNORMAL LOW (ref 98–111)
Creatinine, Ser: 4.15 mg/dL — ABNORMAL HIGH (ref 0.44–1.00)
GFR, Estimated: 10 mL/min — ABNORMAL LOW (ref >60)
Glucose, Bld: 244 mg/dL — ABNORMAL HIGH (ref 70–99)
Phosphorus: 3.8 mg/dL (ref 2.5–4.6)
Potassium: 4.2 mmol/L (ref 3.5–5.1)
Sodium: 131 mmol/L — ABNORMAL LOW (ref 135–145)

## 2020-04-26 LAB — GLUCOSE, CAPILLARY
Glucose-Capillary: 164 mg/dL — ABNORMAL HIGH (ref 70–99)
Glucose-Capillary: 156 mg/dL — ABNORMAL HIGH (ref 70–99)
Glucose-Capillary: 151 mg/dL — ABNORMAL HIGH (ref 70–99)
Glucose-Capillary: 149 mg/dL — ABNORMAL HIGH (ref 70–99)
Glucose-Capillary: 171 mg/dL — ABNORMAL HIGH (ref 70–99)

## 2020-04-26 LAB — MAGNESIUM: Magnesium: 2.8 mg/dL — ABNORMAL HIGH (ref 1.7–2.4)

## 2020-04-26 LAB — PROCALCITONIN: Procalcitonin: 32.21 ng/mL

## 2020-04-26 MED ORDER — SODIUM CHLORIDE 0.9% FLUSH
10.0000 mL | INTRAVENOUS | Status: DC | PRN
  Administered 2020-04-29: 10 mL

## 2020-04-26 MED ORDER — HEPARIN SODIUM (PORCINE) 5000 UNIT/ML IJ SOLN
5000.0000 [IU] | Freq: Two times a day (BID) | INTRAMUSCULAR | Status: DC
  Administered 2020-04-26 – 2020-04-28 (×5): 5000 [IU] via SUBCUTANEOUS
  Filled 2020-04-26 (×5): qty 1

## 2020-04-26 MED ORDER — TRAVASOL 10 % IV SOLN
INTRAVENOUS | Status: AC
  Filled 2020-04-26: qty 480

## 2020-04-26 MED ORDER — ALBUMIN HUMAN 25 % IV SOLN
25.0000 g | Freq: Three times a day (TID) | INTRAVENOUS | Status: AC
  Administered 2020-04-26 (×3): 25 g via INTRAVENOUS
  Filled 2020-04-26 (×3): qty 100

## 2020-04-26 MED ORDER — SODIUM CHLORIDE 0.9% FLUSH
10.0000 mL | Freq: Two times a day (BID) | INTRAVENOUS | Status: DC
  Administered 2020-04-26 – 2020-04-29 (×7): 10 mL
  Administered 2020-04-30 – 2020-05-01 (×2): 30 mL

## 2020-04-26 NOTE — Progress Notes (Signed)
NAME:  Martha Ortega, MRN:  [id number], DOB:  17-Apr-1935, LOS: 14 ADMISSION DATE:  04/13/2020, CONSULTATION DATE: 03/25/2020 REFERRING MD: Dr. Irine Seal, CHIEF COMPLAINT: Abdominal pain  Brief History:  86 year old with chronic diverticulitis with bowel obstruction from sigmoid stricture that failed medical management s/p ex lap, total colectomy, end ileostomy 12/29 who was hypotensive post-op.   History of Present Illness:  Admitted 12/21 with LLQ pain and bloody stool. CT abdomen/pelivs on admission with diverticulitis of the distal descending colon complicated by a sinus tract and 18 mm collection in the low pericolic gutter. Managed conservatively with NG tube. Liquid diet, IVF. Repeat CT AP 12/25 appeared mildly worse with wall thickening, no frank perforation. Was not taking PO so taken to OR 12/29 for surgery. Now hypotensive despite IVF boluses.   Past Medical History:  Hypertension Diverticulitis  Significant Hospital Events:  Exploratory laparotomy 12/29 Started TPN 04/24/2020 Consults:  General surgery PCCM  Procedures:  Ex lap, total colectomy and end ileostomy 12/29  Significant Diagnostic Tests:  CT abdomen 12/29  Micro Data:  12/2p UCx, BcX x 2 pending  Antimicrobials:  Zosyn 12/26>> Eraxis 12/31>> Vancomycin 12/31>>  Interim History / Subjective:  No significant overnight events Still feeling quite tired Atrial fibrillation  Objective   Blood pressure 139/84, pulse 95, temperature 97.7 F (36.5 C), temperature source Oral, resp. rate 19, height 5\' 2"  (1.575 m), weight 71.4 kg, SpO2 95 %. CVP:  [0 mmHg-12 mmHg] 9 mmHg      Intake/Output Summary (Last 24 hours) at 04/26/2020 1345 Last data filed at 04/26/2020 1200 Gross per 24 hour  Intake 753.13 ml  Output 1315 ml  Net -561.87 ml   Filed Weights   04/23/20 1036 04/24/20 0500 04/26/20 0500  Weight: 67.9 kg 69.3 kg 71.4 kg    Examination: General: Frail, appears more comfortable HENT:  Moist oral mucosa Lungs: Decreased air movement bilaterally, clear breath sounds Cardiovascular: S1-S2 appreciated Abdomen: Postsurgical changes, ostomy with some output Extremities: Warm and dry Neuro: Alert and oriented, moving all extremities GU: Fair output  Resolved Hospital Problem list     Assessment & Plan:  Septic shock Hypovolemic shock -Secondary to intra-abdominal sepsis -On vancomycin, antifungal, Zosyn -Requiring some pressors  New atrial fibrillation with RVR -She did decompensate with beta-blockers -Currently on amiodarone  Acute kidney injury -Avoid nephrotoxic's -Renal dose medications -Fluid resuscitation -Potassium remains controlled -Unfortunately, uptrending BUN/creatinine -Positive fluid balance  Bowel obstruction due to sigmoid stricture and contained perforation History of diverticulosis -Ileus appears to be resolving Continue TPN at present  Acute blood loss anemia -Transfuse per protocol  Risk of decompensation remains still high Still quite frail   Best practice (evaluated daily)  Diet: N.p.o. Pain/Anxiety/Delirium protocol (if indicated): Tylenol, morphine as needed VAP protocol (if indicated): Not indicated DVT prophylaxis: SCD GI prophylaxis: Pepcid Glucose control: SSI Mobility: Bedrest Disposition: ICU  Goals of Care:  Last date of multidisciplinary goals of care discussion: Spoke with son at bedside Family and staff present: Family member at bedside Summary of discussion:  Follow up goals of care discussion due:  Code Status: Full code  Labs   CBC: Recent Labs  Lab 04/11/2020 2234 04/23/20 0251 04/24/20 0231 04/25/20 0442 04/26/20 0420  WBC 8.4 12.3* 17.9* 22.9* 22.7*  NEUTROABS 7.4 11.2* 16.1* 20.3* 19.7*  HGB 9.5* 9.6* 8.3* 7.9* 7.8*  HCT 28.5* 28.5* 25.3* 23.3* 22.6*  MCV 87.4 86.1 86.6 85.3 83.4  PLT 411* 399 330 294 265  Basic Metabolic Panel: Recent Labs  Lab 04/20/20 0417 04/21/20 0549  04/23/20 0251 04/24/20 0231 04/25/20 0442 04/25/20 1758 04/26/20 0420  NA 139   < > 135 137 133*   134* 133* 131*  K 3.9   < > 4.1 4.3 3.9   3.9 4.0 4.2  CL 109   < > 106 101 95*   96* 95* 94*  CO2 19*   < > 17* 20* 22   24 24 24   GLUCOSE 104*   < > 95 107* 243*   245* 188* 244*  BUN 15   < > 25* 32* 38*   40* 46* 51*  CREATININE 1.96*   < > 2.54* 3.48* 3.98*   3.82* 4.17* 4.15*  CALCIUM 8.2*   < > 7.5* 7.3* 7.3*   7.3* 7.5* 7.6*  MG 1.8  --  1.6* 3.1* 2.7*  --  2.8*  PHOS  --   --  3.9 4.5 3.9   4.2  --  3.8   3.8   < > = values in this interval not displayed.   GFR: Estimated Creatinine Clearance: 9.5 mL/min (A) (by C-G formula based on SCr of 4.15 mg/dL (H)). Recent Labs  Lab 04/21/2020 2234 04/23/20 0023 04/23/20 0251 04/24/20 0231 04/25/20 0442 04/26/20 0420 04/26/20 0421  PROCALCITON 33.28  --  38.94 51.24 38.17 32.21  --   WBC 8.4  --  12.3* 17.9* 22.9* 22.7*  --   LATICACIDVEN 2.5* 1.8  --   --   --   --  1.3    Liver Function Tests: Recent Labs  Lab 04/20/2020 2234 04/23/20 0251 04/24/20 0231 04/25/20 0442 04/26/20 0420  AST 33  --   --  57*  --   ALT 14  --   --  23  --   ALKPHOS 30*  --   --  83  --   BILITOT 0.9  --   --  0.8  --   PROT 4.0*  --   --  4.9*  --   ALBUMIN 1.8* 2.4* 2.0* 2.0*   2.0* 1.7*   No results for input(s): LIPASE, AMYLASE in the last 168 hours. No results for input(s): AMMONIA in the last 168 hours.  ABG No results found for: PHART, PCO2ART, PO2ART, HCO3, TCO2, ACIDBASEDEF, O2SAT   Coagulation Profile: No results for input(s): INR, PROTIME in the last 168 hours.  Cardiac Enzymes: No results for input(s): CKTOTAL, CKMB, CKMBINDEX, TROPONINI in the last 168 hours.  HbA1C: Hgb A1c MFr Bld  Date/Time Value Ref Range Status  04/23/2020 02:51 AM 5.2 4.8 - 5.6 % Final    Comment:    (NOTE) Pre diabetes:          5.7%-6.4%  Diabetes:              >6.4%  Glycemic control for   <7.0% adults with diabetes   11/25/2010 09:33  AM 5.5 4.6 - 6.5 % Final    Comment:    Glycemic Control Guidelines for People with Diabetes:Non Diabetic:  <6%Goal of Therapy: <7%Additional Action Suggested:  >8%     CBG: Recent Labs  Lab 04/25/20 2002 04/25/20 2326 04/26/20 0323 04/26/20 0748 04/26/20 1202  GLUCAP 178* 152* 164* 156* 151*    Review of Systems:   Some abdominal discomfort  Past Medical History:  She,  has a past medical history of Abnormal blood findings, Allergic rhinitis, History of diverticulitis of colon, Hyperglycemia (09), Hyperlipidemia, Hypertension, and Stroke (cerebrum) (  [place]).   Surgical History:   Past Surgical History:  Procedure Laterality Date   ABDOMINAL HYSTERECTOMY     APPENDECTOMY     Patient states not sure if this is true   barium swallow  10/18/03   CARDIOVASCULAR STRESS TEST  07/21/04   COLON RESECTION N/A 03/26/2020   Procedure: TOTAL ABDOMINAL COLECTOMY WITH ILEOSTOMY REPAIR OF STOMACH INJURY;  Surgeon: Kieth Brightly, Arta Bruce, MD;  Location: [facility];  Service: General;  Laterality: N/A;   CYSTECTOMY  93   R axilla   CYSTECTOMY  97   R breast     Social History:   reports that she quit smoking about 42 years ago. She has never used smokeless tobacco. She reports that she does not drink alcohol and does not use drugs.   Family History:  Her family history includes Arthritis in her mother and sister; Diabetes in her sister; Hypertension in her father; Lupus in her sister; Prostate cancer in her brother; Stroke in her father.   Allergies Allergies  Allergen Reactions   Amlodipine Swelling    Throat and mouth swelling   Fluticasone Propionate     REACTION: does not help   Loratadine     REACTION: does not work   Lovastatin     REACTION: was not effective   Omeprazole Swelling    Throat, lips, and mouth swelling   Zocor [Simvastatin - High Dose]     Mouth and throat swelled    The patient is critically ill with multiple organ systems failure and requires high  complexity decision making for assessment and support, frequent evaluation and titration of therapies, application of advanced monitoring technologies and extensive interpretation of multiple databases. Critical Care Time devoted to patient care services described in this note independent of APP/resident time (if applicable)  is 40 minutes.   Sherrilyn Rist MD [facility] Personal pager: [phone] If unanswered, please page CCM On-call: [phone]

## 2020-04-26 NOTE — Progress Notes (Signed)
Subjective Sleepy; pressors off. Denies n/v with NG. She has some ileostomy output now for 2 days. Pain well controlled.  Objective: Vital signs in last 24 hours: Temp:  [97.4 F (36.3 C)-98.8 F (37.1 C)] 97.9 F (36.6 C) (01/02 0352) Pulse Rate:  [44-149] 98 (01/02 0600) Resp:  [12-29] 12 (01/02 0600) BP: (96-140)/(46-120) 132/64 (01/02 0600) SpO2:  [92 %-100 %] 100 % (01/02 0600) Weight:  [71.4 kg] 71.4 kg (01/02 0500) Last BM Date: 04/25/20  Intake/Output from previous day: 01/01 0701 - 01/02 0700 In: 1631.7 [I.V.:1403.3; IV Piggyback:228.4] Out: 1355 [Urine:375; Emesis/NG output:800; Drains:30; Stool:150] Intake/Output this shift: No intake/output data recorded.  Gen: NAD, comfortable CV: tachycardic rate, regular rhythm Pulm: Normal work of breathing Abd: Soft, minimally ttp; nondistended; ileostomy pink with thick green succus, JP serosang; NG bilious Ext: SCDs in place   Lab Results: CBC  Recent Labs    04/25/20 0442 04/26/20 0420  WBC 22.9* 22.7*  HGB 7.9* 7.8*  HCT 23.3* 22.6*  PLT 294 265   BMET Recent Labs    04/25/20 1758 04/26/20 0420  NA 133* 131*  K 4.0 4.2  CL 95* 94*  CO2 24 24  GLUCOSE 188* 244*  BUN 46* 51*  CREATININE 4.17* 4.15*  CALCIUM 7.5* 7.6*   PT/INR No results for input(s): LABPROT, INR in the last 72 hours. ABG No results for input(s): PHART, HCO3 in the last 72 hours.  Invalid input(s): PCO2, PO2  Studies/Results:  Anti-infectives: Anti-infectives (From admission, onward)   Start     Dose/Rate Route Frequency Ordered Stop   04/25/20 1000  anidulafungin (ERAXIS) 100 mg in sodium chloride 0.9 % 100 mL IVPB        100 mg 78 mL/hr over 100 Minutes Intravenous Every 24 hours 04/24/20 0956     04/24/20 1130  vancomycin (VANCOREADY) IVPB 1500 mg/300 mL        1,500 mg 150 mL/hr over 120 Minutes Intravenous  Once 04/24/20 1038 04/24/20 1440   04/24/20 1100  anidulafungin (ERAXIS) 200 mg in sodium chloride 0.9 % 200 mL IVPB         200 mg 78 mL/hr over 200 Minutes Intravenous  Once 04/24/20 0958 04/24/20 1515   04/24/20 1046  vancomycin variable dose per unstable renal function (pharmacist dosing)         Does not apply See admin instructions 04/24/20 1046     04/06/2020 1200  piperacillin-tazobactam (ZOSYN) IVPB 2.25 g        2.25 g 100 mL/hr over 30 Minutes Intravenous Every 8 hours 03/30/2020 1138         Assessment/Plan: Patient Active Problem List   Diagnosis Date Noted  . Severe sepsis with septic shock ([place]) 04/23/2020  . Postoperative hypovolemic shock 04/23/2020  . Pneumoperitoneum 04/23/2020  . Renal failure (ARF), acute on chronic ([place]) 04/23/2020  . Elevated troponin 04/23/2020  . Ureter injury   . Large bowel obstruction ([place])   . Cerebrovascular accident (CVA) ([place])   . Hypomagnesemia   . Hypokalemia   . Sepsis ([place])   . Pericolonic abscess due to diverticulitis 04/21/2020  . Diverticulitis of sigmoid colon with abscess 04/05/2020  . Aortic atherosclerosis ([place]) 04/15/2020  . ABLA (acute blood loss anemia) 04/20/2020  . Blood in stool   . Abdominal pain 04/06/2020  . CKD (chronic kidney disease), stage IV ([place]) 04/06/2020  . Mild protein-calorie malnutrition ([place]) 04/06/2020  . Osteopenia 07/26/2015  . Estrogen deficiency 06/23/2015  . History of CVA (cerebrovascular  accident) 03/02/2015  . Encounter for Medicare annual wellness exam 09/23/2013  . Spinal stenosis of lumbar region 09/23/2013  . GERD (gastroesophageal reflux disease) 12/12/2011  . Renal insufficiency 11/26/2008  . ALLERGIC RHINITIS 09/10/2007  . Hyperlipidemia 09/08/2006  . Essential hypertension 09/08/2006  . POSTNASAL DRIP SYNDROME 09/08/2006   s/p Procedure(s): TOTAL ABDOMINAL COLECTOMY WITH END ILEOSTOMY; GASTRIC REPAIR 03/28/2020  -Renal US 12/30 showed no hydronephrosis -Oliguria, Cr up trending - jp remains low volume. Nephrology following and appreciate assistance in her care -Possible ACS vs demand  ischemia note; afib; as per cardiology -Cont JP drain to bulb suction -NPO, MIVF; clamping NG and starting trickle tube feeds today would be reasonable now that she remains off pressors - monitor residuals -TPN - continue until reliably tolerating goal feeds -PPx: SCDs, ok for chemical dvt ppx from our standpoint   LOS: 12 days   Sharon Mt. Dema Severin, M.D. [facility] Use [email or web address] to contact on call provider

## 2020-04-26 NOTE — Progress Notes (Signed)
Progress Note  Patient Name: Martha Ortega Date of Encounter: 04/26/2020  [facility] Cardiologist: Buford Dresser, MD   Subjective   Son at bedside today. She has a little more energy today but still overall fatigued. Remains in atrial fibrillation. No chest pain.  Inpatient Medications    Scheduled Meds: . bisacodyl  10 mg Rectal Daily  . chlorhexidine  15 mL Mouth Rinse BID  . Chlorhexidine Gluconate Cloth  6 each Topical Daily  . furosemide  80 mg Intravenous Q12H  . insulin aspart  0-6 Units Subcutaneous Q4H  . lip balm  1 application Topical BID  . mouth rinse  15 mL Mouth Rinse q12n4p  . sodium chloride flush  10-40 mL Intracatheter Q12H  . vancomycin variable dose per unstable renal function (pharmacist dosing)   Does not apply See admin instructions   Continuous Infusions: . sodium chloride Stopped (04/24/20 1705)  . albumin human 25 g (04/26/20 0817)  . amiodarone 30 mg/hr (04/26/20 0510)  . anidulafungin Stopped (04/25/20 1114)  . famotidine (PEPCID) IV Stopped (04/25/20 1104)  . methocarbamol (ROBAXIN) IV Stopped (04/19/2020 2300)  . norepinephrine (LEVOPHED) Adult infusion Stopped (04/25/20 0842)  . piperacillin-tazobactam Stopped (04/26/20 0545)  . TPN ADULT (ION) 40 mL/hr at 04/25/20 1813   PRN Meds: acetaminophen **OR** acetaminophen, alum & mag hydroxide-simeth, diphenhydrAMINE, HYDROcodone-acetaminophen, lip balm, magic mouthwash, methocarbamol (ROBAXIN) IV, morphine injection, ondansetron **OR** ondansetron (ZOFRAN) IV, phenol, sodium chloride flush   Vital Signs    Vitals:   04/26/20 0700 04/26/20 0800 04/26/20 0832 04/26/20 0845  BP: (!) 111/59 128/68 (!) 146/79   Pulse: 80 87 93   Resp: 12 13 15    Temp:    97.7 F (36.5 C)  TempSrc:    Oral  SpO2: 100% 100% 100%   Weight:      Height:        Intake/Output Summary (Last 24 hours) at 04/26/2020 0939 Last data filed at 04/26/2020 0800 Gross per 24 hour  Intake 1631.7 ml  Output 1190  ml  Net 441.7 ml   Last 3 Weights 04/26/2020 04/24/2020 04/23/2020  Weight (lbs) 157 lb 6.5 oz 152 lb 12.5 oz 149 lb 11.1 oz  Weight (kg) 71.4 kg 69.3 kg 67.9 kg      Telemetry    Remains in atrial fibrillation, rates largely 90-100s - Personally Reviewed  ECG    12/31 afib RVR at 154 bpm, no new since - Personally Reviewed  Physical Exam   GEN: frail appearing, NG in place NECK: No JVD appreciated CARDIAC: irregularly irregular rhythm, normal S1 and S2, no rubs or gallops. No murmur. VASCULAR: Radial pulses 2+ bilaterally.  RESPIRATORY:  Clear to auscultation in anterior and lateral lung fields, shallow breaths ABDOMEN: Soft, mildly tender MUSCULOSKELETAL:  Moves all 4 limbs independently SKIN: Warm and dry, no edema NEUROLOGIC:  No focal neuro deficits noted. PSYCHIATRIC:  Normal affect   Labs    High Sensitivity Troponin:   Recent Labs  Lab 04/19/2020 2234 04/23/20 0023 04/23/20 0251  TROPONINIHS 106* 128* 139*      Chemistry Recent Labs  Lab 04/15/2020 2234 04/23/20 0251 04/24/20 0231 04/25/20 0442 04/25/20 1758 04/26/20 0420  NA 137   < > 137 133*  134* 133* 131*  K 4.1   < > 4.3 3.9  3.9 4.0 4.2  CL 109   < > 101 95*  96* 95* 94*  CO2 17*   < > 20* 22  24 24 24   GLUCOSE  99   < > 107* 243*  245* 188* 244*  BUN 24*   < > 32* 38*  40* 46* 51*  CREATININE 2.53*   < > 3.48* 3.98*  3.82* 4.17* 4.15*  CALCIUM 7.4*   < > 7.3* 7.3*  7.3* 7.5* 7.6*  PROT 4.0*  --   --  4.9*  --   --   ALBUMIN 1.8*   < > 2.0* 2.0*  2.0*  --  1.7*  AST 33  --   --  57*  --   --   ALT 14  --   --  23  --   --   ALKPHOS 30*  --   --  83  --   --   BILITOT 0.9  --   --  0.8  --   --   GFRNONAA 18*   < > 13* 11*  11* 10* 10*  ANIONGAP 11   < > 16* 16*  14 14 13    < > = values in this interval not displayed.     Hematology Recent Labs  Lab 04/24/20 0231 04/25/20 0442 04/26/20 0420  WBC 17.9* 22.9* 22.7*  RBC 2.92* 2.73* 2.71*  HGB 8.3* 7.9* 7.8*  HCT 25.3* 23.3*  22.6*  MCV 86.6 85.3 83.4  MCH 28.4 28.9 28.8  MCHC 32.8 33.9 34.5  RDW 17.7* 17.8* 18.3*  PLT 330 294 265    BNPNo results for input(s): BNP, PROBNP in the last 168 hours.   DDimer No results for input(s): DDIMER in the last 168 hours.   Radiology    DG Abd 1 View  Result Date: 04/25/2020 CLINICAL DATA:  NG tube placement EXAM: ABDOMEN - 1 VIEW COMPARISON:  04/25/2020 FINDINGS: Partially visualized central venous catheter tip over the distal SVC. Esophageal tube tip overlies the mid gastric region. Airspace disease at both lung bases. Incompletely visualized air-filled bowel in the mid abdomen. IMPRESSION: Esophageal tube tip overlies the mid gastric region. Electronically Signed   By: Donavan Foil M.D.   On: 04/25/2020 18:16   DG Abd 1 View  Result Date: 04/25/2020 CLINICAL DATA:  Orogastric tube placement EXAM: ABDOMEN - 1 VIEW COMPARISON:  None. FINDINGS: Tip and side port of the orogastric tube project within the stomach. No visible dilated bowel. IMPRESSION: Orogastric tube tip in the stomach. Electronically Signed   By: Ulyses Jarred M.D.   On: 04/25/2020 03:06   DG CHEST PORT 1 VIEW  Result Date: 04/24/2020 CLINICAL DATA:  New central line placement. EXAM: PORTABLE CHEST 1 VIEW COMPARISON:  Radiograph 2 days ago 04/17/2020 FINDINGS: New right subclavian central line tip at the atrial caval junction. There is no pneumothorax. Tip and side port of the enteric tube below the diaphragm in the stomach. Hazy bilateral lung base opacities, right greater than left, likely combination of pleural fluid and atelectasis. Vascular congestion. Heart is normal in size. Aortic atherosclerosis. Similar or improving right upper quadrant free air to prior abdominal CT, likely postsurgical. IMPRESSION: 1. Right subclavian central line with tip at the atrial caval junction. No pneumothorax. 2. Hazy bilateral lung base opacities, right greater than left, likely combination of pleural fluid and atelectasis.  Vascular congestion. Electronically Signed   By: Keith Rake M.D.   On: 04/24/2020 16:25    Cardiac Studies   Echo 04/21/20  1. Left ventricular ejection fraction, by estimation, is 60 to 65%. The  left ventricle has normal function. The left ventricle has no regional  wall  motion abnormalities. There is mild concentric left ventricular  hypertrophy. Left ventricular diastolic  parameters are consistent with Grade I diastolic dysfunction (impaired  relaxation).  2. Right ventricular systolic function is normal. The right ventricular  size is normal. There is normal pulmonary artery systolic pressure. The  estimated right ventricular systolic pressure is 96.0 mmHg.  3. The mitral valve is normal in structure. No evidence of mitral valve  regurgitation. No evidence of mitral stenosis.  4. The aortic valve is normal in structure. Aortic valve regurgitation is  not visualized. No aortic stenosis is present.  5. The inferior vena cava is normal in size with greater than 50%  respiratory variability, suggesting right atrial pressure of 3 mmHg.   Patient Profile     86 y.o. female with PMH CVA, CKD stage 4, hypertension, hyperlipidemia, complicated diverticulitis s/p surgery 04/16/2020 who is being followed for elevated troponins and atrial fibrillation at the request of Dr. Grandville Silos  Assessment & Plan    Atrial fibrillation, postoperative -no prior history of this -likely triggered by septic shock/acute illness, s/p OR 04/13/2020 -agree with amiodarone IV for now. Once taking oral medication routinely, can transition to oral loading -CHA2DS2/VAS Stroke Risk Points=6; however, she is anemic and postop. She would benefit from anticoagulation once more stable, but her current anemia (with Hgb downtrending) makes heparin high risk at this point. -as she has required pressors recently, would avoid beta blocker/calcium channel blocker for now. If blood pressure continues to improve,  consider low dose beta blocker.  Sepsis, with diverticulitis/GI as source, with hypovolemic shock - since surgery, she had hypotension, minimal UOP and lactic acidosis. - On norepinephrine drip up until 04/25/20, weaned off  - IV abx per primary team/PCCM  Recurrent diverticulitis s/p ex-lap and total colectomy and end ileostomy now with bowel obstruction due to sigmoid stricture with contained perforation - management per surgery/primary team - on vancomycin, zosyn, and anidulafungin  Elevated troponin - HS troponin elevated in the setting of septic and hypovolemic shock; asymptomatic, normal echo supports demand ischemia over ACS - Patient has no prior significant cardiac history.  - Pre-op echo 12/28 showed preserved EF, G1DD   AKI with oliguira - Baseline creatinine 1.7-2 - creatinine 4.15 today - being followed by nephrology - net positive about 12 L. Trialing lasix, but per nephrology note may need RRT soon  Hypertension, history of, now with hypotension - antihypertensives held in the setting of shock - required levophed, weaned off 04/25/20 - was on hydralazine 50mg  BID, Toprol 50mg  daily, losartan 50mg  daily as an outpatient - blood pressures rising slightly, but will hold on restarting medications to allow for renal perfusion. Would use low dose beta blocker as first medication given afib  Hyperlipidemia - LDL 63 11/01/19 - was on rosuvastatin as an outpatient. Resume when able  Electrolyte abnormalities - Keep Mag >2 and K>4 ideally, though with renal failure need to be cautious about repletion  Anemia with suspected GIB - Aspirin held - Hgb has remained in the 7 range.Today 7.8 - anemia/downtrending Hgb makes anticoagulation for atrial fibrillation high risk, despite elevated chadsvasc score.  She remains very ill, with multiple comorbid conditions. Complex medical decision making.  For questions or updates, please contact [place] Please consult  [email or web address] for contact info under        Signed, Buford Dresser, MD  04/26/2020, 9:39 AM

## 2020-04-26 NOTE — Progress Notes (Signed)
[facility] Progress Note  Subjective: seen in room, no new c/o's. Creat stable 4.1 this am.   Vitals:   04/26/20 0832 04/26/20 0845 04/26/20 1100 04/26/20 1200  BP: (!) 146/79  (!) 113/59 (!) 150/76  Pulse: 93  93   Resp: 15  16 (!) 29  Temp:  97.7 F (36.5 C)    TempSrc:  Oral    SpO2: 100%  95%   Weight:      Height:        Exam: Gen elderly female, NG tube in place, a little brighter today No jvd or bruits Chest dec'd at R base, L side clear RRR no MRG Abd large midline wound dressed, and mid abd ostomy intact, +BS GU foley cath w/ small amts clear urine Ext diffuse sig 2+ bilat LE edema Neuro is alert, nonfocal    Home meds:  - asa 81/  crestor 20  - hydralazine 50 bid/ losartan 50 qd/ toprol xl 50 qd  - prn's/ vitamins/ supplements     Date              Creat               eGFR    2008- 2013   1.2- 1.4    2014- 2018   1.3- 1.7            36- 52, stage IIIb    2019             1.80                 35    2020             1.78                 33, stage IIIb    10/2019          2.06                 28, stage IV    04/06/20       2.38                 18, stage IV    04/24/2020       2.09                 23    04/23/20       2.54                 18    UA 12/29 - negative   UNa <10, UCr 122    CT abd pelv no contrast 12/20 - ADRENALS/URINARY TRACT: The adrenal glands are normal. No hydronephrosis, nephroureterolithiasis or solid renal mass. The urinary bladder is normal for degree of distention    CXR 12/29 - IMPRESSION: 1. Rounded gas lucencies projecting over right upper quadrant, suspicious for pneumoperitoneum. Repeat CT of the abdomen and pelvis may be useful. 2. Bibasilar consolidation, likely atelectasis. Trace right pleural effusion. 3. Enteric catheter as above.     Assessment/ Plan: 1. AKI on CKD IV - b/l creat 2.0 from July 2021, eGFR 28. AKI likely due to post-op shock/ hypoperfusion.  No nephrotoxins noted.  On pressors. UA negative and  renal US/ CT w/o obstruction. Admit creat 1.8 >> up to 2.5 > 3.4 > 3.8 > (4.1 last pm) > 4.1 this am . UOP better on IV lasix, still < 500 /d. CVP low w/ 3rd spacing, starting IV alb  and cont IV lasix. No indication for RRT at this time. Hopefully turning the corner.   2. Diverticulitis - sp total colectomy on 12/29 3. Shock - postop, getting TNA, off pressors, on IV  Zosyn and now IV anidulafungin.  4. H/o CVA 5. Anemia - Hb in 9- 10 range, transfuse prn 6. BP/ volume - BP's low to low-normal. Up 11 kg now 7. Gantt - d/w son about GOC/ code status, asked him to discuss w/ his family members       Kelly Splinter 04/26/2020, 12:35 PM   Recent Labs  Lab 04/25/20 0442 04/25/20 1758 04/26/20 0420  K 3.9  3.9 4.0 4.2  BUN 38*  40* 46* 51*  CREATININE 3.98*  3.82* 4.17* 4.15*  CALCIUM 7.3*  7.3* 7.5* 7.6*  PHOS 3.9  4.2  --  3.8  3.8  HGB 7.9*  --  7.8*   Inpatient medications: . bisacodyl  10 mg Rectal Daily  . chlorhexidine  15 mL Mouth Rinse BID  . Chlorhexidine Gluconate Cloth  6 each Topical Daily  . furosemide  80 mg Intravenous Q12H  . heparin injection (subcutaneous)  5,000 Units Subcutaneous Q12H  . insulin aspart  0-6 Units Subcutaneous Q4H  . lip balm  1 application Topical BID  . mouth rinse  15 mL Mouth Rinse q12n4p  . sodium chloride flush  10-40 mL Intracatheter Q12H   . sodium chloride Stopped (04/24/20 1705)  . albumin human 25 g (04/26/20 0817)  . amiodarone 30 mg/hr (04/26/20 0510)  . anidulafungin 100 mg (04/26/20 1055)  . famotidine (PEPCID) IV Stopped (04/26/20 1133)  . methocarbamol (ROBAXIN) IV Stopped (04/03/2020 2300)  . norepinephrine (LEVOPHED) Adult infusion Stopped (04/25/20 0842)  . piperacillin-tazobactam Stopped (04/26/20 0545)  . TPN ADULT (ION) 40 mL/hr at 04/25/20 1813  . TPN ADULT (ION)     acetaminophen **OR** acetaminophen, alum & mag hydroxide-simeth, diphenhydrAMINE, HYDROcodone-acetaminophen, lip balm, magic mouthwash,  methocarbamol (ROBAXIN) IV, morphine injection, ondansetron **OR** ondansetron (ZOFRAN) IV, phenol, sodium chloride flush

## 2020-04-26 NOTE — Progress Notes (Signed)
PHARMACY - TOTAL PARENTERAL NUTRITION CONSULT NOTE   Indication: prolonged ileus  Patient Measurements: Height: 5\' 2"  (157.5 cm) Weight: 71.4 kg (157 lb 6.5 oz) IBW/kg (Calculated) : 50.1 TPN AdjBW (KG): 59.9 Body mass index is 28.79 kg/m.  Assessment:  86 y/o F with a h/o CKD IV admitted with sigmoid stricture s/p colectomy with end ileostomy. Surgery expects protracted ileus in addition to the fact that patient has gone many days with poor appetite even prior to admission. Pharmacy consulted to initiate TPN.   Glucose / Insulin: CBGs 185- 156, 6 units insulin/24 hr Electrolytes: Mag remains elevated, Cl sl dec, Bicarb wnl (req bicarb gtt), Na sl low, K wnl, Phos wnl, CorrCa 9.4 Renal: SCr elevated - stable ~ 4, renal consulted, Lasix added LFTs / TGs: LFTs ~ wnl (AST sl incr), TG 132 (1/1) Prealbumin / albumin: Alb 2.0, Pre-Alb < 5 (1/1) Intake / Output; MIVF: NPO/ oliguric; Bicarb drip d/c, current LR now discontinued, 1/1 added Lasix 80mg  IV bid  GI Imaging:  CT abdomen pelvis 12/30: 1. Status post recent total colectomy with multifocal pneumoperitoneum throughout the abdomen. The greatest concentration of free air is in the right upper quadrant. This is favored to be post-operative. 2. Dilated gallbladder is nonspecific in this setting. 3. Intermediate sized right and small left pleural effusions with right basilar consolidation. Surgeries / Procedures: 12/29 colectomy with end ileostomy  Central access:  Triple lumen CVC placed 12/31 TPN start date: 12/31   Nutritional Goals (per RD recommendation on pending): 12/31: kCal: 8937-3428 , Protein: 90-105, Fluid: 1.5L/day (total fluid rate ~ 65 ml/hr) Goal TPN rate 70 mL/hr (provides 84 g of protein and 1670  kcals per day)  Current Nutrition:  NPO, may begin trickle feeds  Plan:   Continue TPN at 40 mL/hr  Electrolytes in TPN: incr to 72mEq/L of Na, 70mEq/L of K, 30mEq/L of Ca, remove Mg, 46mmol/L of Phos. Cl:Ac ratio  1:1 Add standard MVI and trace elements to TPN Initiate very sensitive q4h SSI and adjust as needed  Monitor TPN labs on Mon/Thurs  Minda Ditto PharmD 04/26/2020,7:27 AM

## 2020-04-26 NOTE — Progress Notes (Signed)
[facility] Progress Note  Patient: Martha Ortega    HCW:[id number]  DOA: 04/24/2020     Date of Service: the patient was seen and examined on 04/26/2020  Brief hospital course: PMH of CVA on aspirin, CKD 4, hypertension, hyperlipidemia, diverticulitis, s/p appendectomy and hysterectomy.  12/13 treated with Augmentin for 2 weeks for diverticulitis 12/21 admission for diverticulitis, refractory to therapy 12/29 failed medical therapy.  Total abdominal colectomy with end ileostomy.  Complicated by stomach injury and left ureter entrapment, repaired intraoperatively. 12/30 transferred to ICU for hypotension, nephrology and cardiology consulted 12/31 Central venous catheter insertion, TPN initiation, started on amiodarone, Eraxis and vancomycin added to Zosyn, also started on pressors 1/2 off of pressors. Currently plan is continuing IV antibiotics, monitor renal function and return of normal bowel function.  Assessment and Plan: 1 severe sepsis with septic shock and hypovolemic shock Refractory diverticulitis Sigmoid stricture Sepsis POA. Outpatient treated with oral Augmentin without any success. Was treated with IV antibiotics here initially showed improvement but as the time progressed developed abdominal distention with worsening pain. Taken to the OR for emergent laparotomy.  #1 back Postop developed septic shock along with hypovolemic shock. Transferred to the ICU. Repeat CT scan shows expected pneumoperitoneum but no other acute abnormality. Was on IV Zosyn. Antibiotics broadened with IV vancomycin and IV Eraxis. Was on IV pressors between 12/31-1/1 currently off of the pressors. On IV albumin. Cultures so far negative. Due to her renal function worsening and improvement in clinical picture with a short timeline we will be stopping IV vancomycin.  2. Entrapment of left ureter-ruled out Intraoperatively patient noted to have possible left ureteral injury with entrapment  of left ureter.   Urology was consulted during surgery and patient underwent left ureteral exploration, left ureterolysis per Dr. Diona Fanti 03/29/2020. No injury identified.  6.  Hypokalemia/hypomagnesemia Electrolytes repleted. Follow.   7.  Normocytic anemia Suspected GI bleed Post-op acute blood loss anemia Hemoglobin stable for now although trending down. Monitor H&H. Transfuse for hemoglobin less than 7 or hemodynamic instability. low iron SP IV iron infusion. Patient noted to have had hematochezia at home likely associated with acute recurrent diverticulitis.   GI was consulted during this hospitalization and no plans for intervention at this time. Outpatient follow-up with GI.  8.  Hypertension, hypotension Blood pressure currently normal without any pressors or medication. Monitor.  9.  History of nonhemorrhagic CVA Noted to be on aspirin and statin at home prior to admission.   Aspirin was on hold due to concern for possible GI bleed. Currently n.p.o.  Statin on hold and likely resume when tolerating oral intake.   10.  Acute on chronic kidney disease stage IV Metabolic acidosis Baseline creatinine 1.7-2. Likely a prerenal azotemia, hemodynamic instability as well as ATN. Renal ultrasound negative for hydronephrosis. Patient with minimal urine output recorded.   Appreciate nephrology input and recommendations.   11.  Elevated troponin Paroxysmal A. fib with RVR Elevated troponin likely demand ischemia 2D echo done preoperatively with 60 to 65%, no wall motion abnormalities, grade 1 diastolic dysfunction.   Cardiology was consulted do not recommend starting IV heparin.   Diet: N.p.o. DVT Prophylaxis:   heparin injection 5,000 Units Start: 04/26/20 1200 SCDs Start: 04/23/2020 1309    Advance goals of care discussion: Full code  Family Communication: family was present at bedside, at the time of interview.  The pt provided permission to discuss medical plan  with the family. Opportunity was given to ask question and  all questions were answered satisfactorily.   Disposition:  Status is: Inpatient  Remains inpatient appropriate because:IV treatments appropriate due to intensity of illness or inability to take PO and Inpatient level of care appropriate due to severity of illness  Dispo: The patient is from: Home              Anticipated d/c is to: SNF              Anticipated d/c date is: > 3 days              Patient currently is not medically stable to d/c.  Subjective: Pain well controlled.  No nausea no vomiting.  No bowel function.  Physical Exam:  General: Appear in mild distress, no Rash; Oral Mucosa Clear, moist. no Abnormal Neck Mass Or lumps, Conjunctiva normal  Cardiovascular: S1 and S2 Present, no Murmur, Respiratory: good respiratory effort, Bilateral Air entry present and CTA, no Crackles, no wheezes Abdomen: Bowel Sound absent, Soft and mild tenderness Extremities: no Pedal edema Neurology: alert and oriented to time, place, and person affect appropriate. no new focal deficit Gait not checked due to patient safety concerns  Vitals:   04/26/20 1100 04/26/20 1200 04/26/20 1300 04/26/20 1400  BP: (!) 113/59 (!) 150/76 139/84 (!) 149/58  Pulse: 93  95 98  Resp: 16 (!) 29 19 (!) 22  Temp:   97.9 F (36.6 C)   TempSrc:   Axillary   SpO2: 95%  95% 95%  Weight:      Height:        Intake/Output Summary (Last 24 hours) at 04/26/2020 1600 Last data filed at 04/26/2020 1200 Gross per 24 hour  Intake 704.88 ml  Output 1315 ml  Net -610.12 ml   Filed Weights   04/23/20 1036 04/24/20 0500 04/26/20 0500  Weight: 67.9 kg 69.3 kg 71.4 kg    Data Reviewed: I have personally reviewed and interpreted daily labs, tele strips, imagings as discussed above. I reviewed all nursing notes, pharmacy notes, vitals, pertinent old records I have discussed plan of care as described above with RN and patient/family.  CBC: Recent Labs  Lab  04/21/2020 2234 04/23/20 0251 04/24/20 0231 04/25/20 0442 04/26/20 0420  WBC 8.4 12.3* 17.9* 22.9* 22.7*  NEUTROABS 7.4 11.2* 16.1* 20.3* 19.7*  HGB 9.5* 9.6* 8.3* 7.9* 7.8*  HCT 28.5* 28.5* 25.3* 23.3* 22.6*  MCV 87.4 86.1 86.6 85.3 83.4  PLT 411* 399 330 294 709   Basic Metabolic Panel: Recent Labs  Lab 04/20/20 0417 04/21/20 0549 04/23/20 0251 04/24/20 0231 04/25/20 0442 04/25/20 1758 04/26/20 0420  NA 139   < > 135 137 133*  134* 133* 131*  K 3.9   < > 4.1 4.3 3.9  3.9 4.0 4.2  CL 109   < > 106 101 95*  96* 95* 94*  CO2 19*   < > 17* 20* 22  24 24 24   GLUCOSE 104*   < > 95 107* 243*  245* 188* 244*  BUN 15   < > 25* 32* 38*  40* 46* 51*  CREATININE 1.96*   < > 2.54* 3.48* 3.98*  3.82* 4.17* 4.15*  CALCIUM 8.2*   < > 7.5* 7.3* 7.3*  7.3* 7.5* 7.6*  MG 1.8  --  1.6* 3.1* 2.7*  --  2.8*  PHOS  --   --  3.9 4.5 3.9  4.2  --  3.8  3.8   < > = values in this interval not  displayed.   Studies: DG Abd 1 View  Result Date: 04/25/2020 CLINICAL DATA:  NG tube placement EXAM: ABDOMEN - 1 VIEW COMPARISON:  04/25/2020 FINDINGS: Partially visualized central venous catheter tip over the distal SVC. Esophageal tube tip overlies the mid gastric region. Airspace disease at both lung bases. Incompletely visualized air-filled bowel in the mid abdomen. IMPRESSION: Esophageal tube tip overlies the mid gastric region. Electronically Signed   By: Donavan Foil M.D.   On: 04/25/2020 18:16    Scheduled Meds: . bisacodyl  10 mg Rectal Daily  . chlorhexidine  15 mL Mouth Rinse BID  . Chlorhexidine Gluconate Cloth  6 each Topical Daily  . furosemide  80 mg Intravenous Q12H  . heparin injection (subcutaneous)  5,000 Units Subcutaneous Q12H  . insulin aspart  0-6 Units Subcutaneous Q4H  . lip balm  1 application Topical BID  . mouth rinse  15 mL Mouth Rinse q12n4p  . sodium chloride flush  10-40 mL Intracatheter Q12H   Continuous Infusions: . sodium chloride Stopped (04/24/20 1705)  .  albumin human 25 g (04/26/20 1519)  . amiodarone 30 mg/hr (04/26/20 1426)  . anidulafungin Stopped (04/26/20 1240)  . famotidine (PEPCID) IV Stopped (04/26/20 1133)  . methocarbamol (ROBAXIN) IV Stopped (04/15/2020 2300)  . norepinephrine (LEVOPHED) Adult infusion Stopped (04/25/20 0842)  . piperacillin-tazobactam Stopped (04/26/20 1340)  . TPN ADULT (ION) 40 mL/hr at 04/25/20 1813  . TPN ADULT (ION)     PRN Meds: acetaminophen **OR** acetaminophen, alum & mag hydroxide-simeth, diphenhydrAMINE, HYDROcodone-acetaminophen, lip balm, magic mouthwash, methocarbamol (ROBAXIN) IV, morphine injection, ondansetron **OR** ondansetron (ZOFRAN) IV, phenol, sodium chloride flush  Time spent: 35 minutes  Author: Berle Mull, MD [facility] 04/26/2020 4:00 PM  To reach On-call, see care teams to locate the attending and reach out via [email or web address]. Between 7PM-7AM, please contact night-coverage If you still have difficulty reaching the attending provider, please page the [facility] (Director on Call) for [facility] on amion for assistance.

## 2020-04-27 DIAGNOSIS — I4819 Other persistent atrial fibrillation: Secondary | ICD-10-CM

## 2020-04-27 DIAGNOSIS — R578 Other shock: Secondary | ICD-10-CM

## 2020-04-27 DIAGNOSIS — A419 Sepsis, unspecified organism: Secondary | ICD-10-CM | POA: Diagnosis not present

## 2020-04-27 DIAGNOSIS — R6521 Severe sepsis with septic shock: Secondary | ICD-10-CM | POA: Diagnosis not present

## 2020-04-27 LAB — CBC
HCT: 21.8 % — ABNORMAL LOW (ref 36.0–46.0)
Hemoglobin: 7.6 g/dL — ABNORMAL LOW (ref 12.0–15.0)
MCH: 28.9 pg (ref 26.0–34.0)
MCHC: 34.9 g/dL (ref 30.0–36.0)
MCV: 82.9 fL (ref 80.0–100.0)
Platelets: 178 10*3/uL (ref 150–400)
RBC: 2.63 MIL/uL — ABNORMAL LOW (ref 3.87–5.11)
RDW: 18.6 % — ABNORMAL HIGH (ref 11.5–15.5)
WBC: 20.2 10*3/uL — ABNORMAL HIGH (ref 4.0–10.5)
nRBC: 0.7 % — ABNORMAL HIGH (ref 0.0–0.2)

## 2020-04-27 LAB — COMPREHENSIVE METABOLIC PANEL
ALT: 24 U/L (ref 0–44)
AST: 45 U/L — ABNORMAL HIGH (ref 15–41)
Albumin: 2.8 g/dL — ABNORMAL LOW (ref 3.5–5.0)
Alkaline Phosphatase: 84 U/L (ref 38–126)
Anion gap: 16 — ABNORMAL HIGH (ref 5–15)
BUN: 65 mg/dL — ABNORMAL HIGH (ref 8–23)
CO2: 24 mmol/L (ref 22–32)
Calcium: 8.4 mg/dL — ABNORMAL LOW (ref 8.9–10.3)
Chloride: 94 mmol/L — ABNORMAL LOW (ref 98–111)
Creatinine, Ser: 4.74 mg/dL — ABNORMAL HIGH (ref 0.44–1.00)
GFR, Estimated: 9 mL/min — ABNORMAL LOW (ref >60)
Glucose, Bld: 173 mg/dL — ABNORMAL HIGH (ref 70–99)
Potassium: 4.2 mmol/L (ref 3.5–5.1)
Sodium: 134 mmol/L — ABNORMAL LOW (ref 135–145)
Total Bilirubin: 0.9 mg/dL (ref 0.3–1.2)
Total Protein: 5.8 g/dL — ABNORMAL LOW (ref 6.5–8.1)

## 2020-04-27 LAB — SURGICAL PATHOLOGY

## 2020-04-27 LAB — GLUCOSE, CAPILLARY
Glucose-Capillary: 157 mg/dL — ABNORMAL HIGH (ref 70–99)
Glucose-Capillary: 159 mg/dL — ABNORMAL HIGH (ref 70–99)
Glucose-Capillary: 165 mg/dL — ABNORMAL HIGH (ref 70–99)
Glucose-Capillary: 172 mg/dL — ABNORMAL HIGH (ref 70–99)
Glucose-Capillary: 173 mg/dL — ABNORMAL HIGH (ref 70–99)
Glucose-Capillary: 174 mg/dL — ABNORMAL HIGH (ref 70–99)
Glucose-Capillary: 180 mg/dL — ABNORMAL HIGH (ref 70–99)

## 2020-04-27 LAB — DIFFERENTIAL
Abs Immature Granulocytes: 0.58 10*3/uL — ABNORMAL HIGH (ref 0.00–0.07)
Basophils Absolute: 0.1 10*3/uL (ref 0.0–0.1)
Basophils Relative: 0 %
Eosinophils Absolute: 0.1 10*3/uL (ref 0.0–0.5)
Eosinophils Relative: 1 %
Immature Granulocytes: 3 %
Lymphocytes Relative: 6 %
Lymphs Abs: 1.2 10*3/uL (ref 0.7–4.0)
Monocytes Absolute: 1.6 10*3/uL — ABNORMAL HIGH (ref 0.1–1.0)
Monocytes Relative: 8 %
Neutro Abs: 16.7 10*3/uL — ABNORMAL HIGH (ref 1.7–7.7)
Neutrophils Relative %: 82 %

## 2020-04-27 LAB — MAGNESIUM: Magnesium: 2.7 mg/dL — ABNORMAL HIGH (ref 1.7–2.4)

## 2020-04-27 LAB — PREALBUMIN: Prealbumin: <5 mg/dL — ABNORMAL LOW (ref 18–38)

## 2020-04-27 LAB — TRIGLYCERIDES: Triglycerides: 69 mg/dL (ref <150)

## 2020-04-27 LAB — PHOSPHORUS: Phosphorus: 3.6 mg/dL (ref 2.5–4.6)

## 2020-04-27 MED ORDER — TRAVASOL 10 % IV SOLN
INTRAVENOUS | Status: AC
  Filled 2020-04-27: qty 739.2

## 2020-04-27 MED ORDER — INSULIN ASPART 100 UNIT/ML ~~LOC~~ SOLN
0.0000 [IU] | Freq: Four times a day (QID) | SUBCUTANEOUS | Status: DC
  Administered 2020-04-27 – 2020-04-28 (×7): 1 [IU] via SUBCUTANEOUS
  Administered 2020-04-29: 2 [IU] via SUBCUTANEOUS

## 2020-04-27 NOTE — Progress Notes (Signed)
5 Days Post-Op   Subjective/Chief Complaint: No complaints   Objective: Vital signs in last 24 hours: Temp:  [97.6 F (36.4 C)-97.9 F (36.6 C)] 97.6 F (36.4 C) (01/03 0700) Pulse Rate:  [93-114] 98 (01/03 0700) Resp:  [12-29] 12 (01/03 0700) BP: (108-170)/(58-128) 108/64 (01/03 0700) SpO2:  [91 %-100 %] 100 % (01/03 0700) Last BM Date: 04/26/20  Intake/Output from previous day: 01/02 0701 - 01/03 0700 In: 1894.9 [I.V.:1354.2; IV Piggyback:540.7] Out: 995 [Urine:625; Emesis/NG output:300; Drains:20; Stool:50] Intake/Output this shift: Total I/O In: 159.2 [I.V.:109.2; IV Piggyback:50] Out: -   General appearance: alert and cooperative Resp: clear to auscultation bilaterally Cardio: regular rate and rhythm GI: soft, mild tenderness. ostomy pink and productive. wound clean  Lab Results:  Recent Labs    04/26/20 0420 04/27/20 0642  WBC 22.7* 20.2*  HGB 7.8* 7.6*  HCT 22.6* 21.8*  PLT 265 178   BMET Recent Labs    04/26/20 0420 04/27/20 0642  NA 131* 134*  K 4.2 4.2  CL 94* 94*  CO2 24 24  GLUCOSE 244* 173*  BUN 51* 65*  CREATININE 4.15* 4.74*  CALCIUM 7.6* 8.4*   PT/INR No results for input(s): LABPROT, INR in the last 72 hours. ABG No results for input(s): PHART, HCO3 in the last 72 hours.  Invalid input(s): PCO2, PO2  Studies/Results: DG Abd 1 View  Result Date: 04/25/2020 CLINICAL DATA:  NG tube placement EXAM: ABDOMEN - 1 VIEW COMPARISON:  04/25/2020 FINDINGS: Partially visualized central venous catheter tip over the distal SVC. Esophageal tube tip overlies the mid gastric region. Airspace disease at both lung bases. Incompletely visualized air-filled bowel in the mid abdomen. IMPRESSION: Esophageal tube tip overlies the mid gastric region. Electronically Signed   By: Donavan Foil M.D.   On: 04/25/2020 18:16    Anti-infectives: Anti-infectives (From admission, onward)   Start     Dose/Rate Route Frequency Ordered Stop   04/25/20 1000   anidulafungin (ERAXIS) 100 mg in sodium chloride 0.9 % 100 mL IVPB        100 mg 78 mL/hr over 100 Minutes Intravenous Every 24 hours 04/24/20 0956     04/24/20 1130  vancomycin (VANCOREADY) IVPB 1500 mg/300 mL        1,500 mg 150 mL/hr over 120 Minutes Intravenous  Once 04/24/20 1038 04/24/20 1440   04/24/20 1100  anidulafungin (ERAXIS) 200 mg in sodium chloride 0.9 % 200 mL IVPB        200 mg 78 mL/hr over 200 Minutes Intravenous  Once 04/24/20 0958 04/24/20 1515   04/24/20 1046  vancomycin variable dose per unstable renal function (pharmacist dosing)  Status:  Discontinued         Does not apply See admin instructions 04/24/20 1046 04/26/20 1041   04/18/2020 1200  piperacillin-tazobactam (ZOSYN) IVPB 2.25 g        2.25 g 100 mL/hr over 30 Minutes Intravenous Every 8 hours 04/21/2020 1138        Assessment/Plan: s/p Procedure(s): TOTAL ABDOMINAL COLECTOMY WITH ILEOSTOMY REPAIR OF STOMACH INJURY (N/A)  12/29 Advance diet . Start clears D/c ng -Renal US 12/30 showed no hydronephrosis -Oliguria, Cr up trending - jp remains low volume. Nephrology following and appreciate assistance in her care -Possible ACS vs demand ischemia note; afib; as per cardiology -Cont JP drain to bulb suction -NPO, MIVF; d/c ng. Start clears-TPN - continue until reliably tolerating goal feeds -PPx: SCDs, ok for chemical dvt ppx from our standpoint  LOS: 13 days  Martha Ortega 04/27/2020

## 2020-04-27 NOTE — Consult Note (Signed)
[place] Nurse ostomy follow up Stoma type/location: RLQ, end ileostomy Stomal assessment/size: 1 3/4" budded; os at 3 o'clock, pink, moist Peristomal assessment: intact  Treatment options for stomal/peristomal skin: 2" skin barrier ring Output gelatinous green stool  Ostomy pouching: 1pc.soft convex with 2" skin barrier ring  Education provided:  Met with patient and her son Tarri Fuller Explained role of ostomy nurse and creation of stoma  Explained stoma characteristics (budded, flush, color, texture, care) Demonstrated pouch change (cutting new skin barrier, measuring stoma, cleaning peristomal skin and stoma, use of barrier ring) Education on emptying when 1/3 to 1/2 full and how to empty Demonstrated use of wick to clean spout  Patient very drowsy during visit; did not participate but did acknowledge some of the teaching. Son observed, will plan to have patient and son participate with next pouch change. Unclear DC plan; patient indicated home however notes mention SNF DC  Enrolled patient in [place] Start Discharge program: Yes  [place] Nurse will follow along with you for continued support with ostomy teaching and care [place] MSN, [place], [place], [place], [place]

## 2020-04-27 NOTE — Progress Notes (Signed)
Physical Therapy Treatment Patient Details Name: Martha Ortega MRN: [id number] DOB: [birth year] Today's Date: 04/27/2020    History of Present Illness Pt admitted with abdominal pain, blood in stool and sepsis 2* IB related to diverticulitis.  Pt with hx of CVA ~ 5 yrs ago with residual L UE and chest "tightness" and L foot "numbness.  Pt s/p total abdominal colectomy with end ileostomy 04/17/2020    PT Comments    Patient is very sweet. Patient able to mobilize with 1 assist to move to sitting, then to stand x 2 and pivot to recliner with "Bear hug" stance and pivot to recliner. Continue PT for mobility.   Follow Up Recommendations  SNF     Equipment Recommendations  None recommended by PT    Recommendations for Other Services       Precautions / Restrictions Precautions Precautions: Fall;Other (comment) Precaution Comments: New Ileostomy; JP drain on R    Mobility  Bed Mobility   Bed Mobility: Sit to Supine     Supine to sit: Mod assist     General bed mobility comments: Increased time with cues for sequence and assist to mange LEs as well as control trunk, Used bed pad to assist to sit upright./  Transfers Overall transfer level: Needs assistance   Transfers: Sit to/from Stand;Stand Pivot Transfers Sit to Stand: Mod assist Stand pivot transfers: Mod assist;From elevated surface       General transfer comment: bear hug  technique to stand x 2 then pivot to recliner.  Ambulation/Gait                 Stairs             Wheelchair Mobility    Modified Rankin (Stroke Patients Only)       Balance Overall balance assessment: Needs assistance Sitting-balance support: No upper extremity supported;Feet supported Sitting balance-Leahy Scale: Fair     Standing balance support: Bilateral upper extremity supported Standing balance-Leahy Scale: Poor Standing balance comment: reliant on support.                            Cognition  Arousal/Alertness: Awake/alert Behavior During Therapy: WFL for tasks assessed/performed Overall Cognitive Status: Within Functional Limits for tasks assessed                                        Exercises General Exercises - Lower Extremity Long Arc Quad: AROM;10 reps;Both;Seated    General Comments        Pertinent Vitals/Pain Faces Pain Scale: Hurts little more Pain Location: abdomen Pain Descriptors / Indicators: Aching;Sore Pain Intervention(s): Monitored during session    Home Living                      Prior Function            PT Goals (current goals can now be found in the care plan section) Progress towards PT goals: Progressing toward goals    Frequency    Min 3X/week      PT Plan Current plan remains appropriate    Co-evaluation              AM-PAC PT "6 Clicks" Mobility   Outcome Measure  Help needed turning from your back to your side while in a flat bed without using bedrails?:  A Lot Help needed moving from lying on your back to sitting on the side of a flat bed without using bedrails?: A Lot Help needed moving to and from a bed to a chair (including a wheelchair)?: A Lot Help needed standing up from a chair using your arms (e.g., wheelchair or bedside chair)?: A Lot Help needed to walk in hospital room?: Total Help needed climbing 3-5 steps with a railing? : Total 6 Click Score: 10    End of Session Equipment Utilized During Treatment: Oxygen Activity Tolerance: Patient limited by fatigue Patient left: in chair;with call bell/phone within reach Nurse Communication: Mobility status PT Visit Diagnosis: Unsteadiness on feet (R26.81);Muscle weakness (generalized) (M62.81);Difficulty in walking, not elsewhere classified (R26.2);Pain     Time: 1610-9604 PT Time Calculation (min) (ACUTE ONLY): 29 min  Charges:  $Therapeutic Activity: 23-37 mins                     Tresa Endo PT Acute Rehabilitation  Services Pager [phone] Office [phone]    Claretha Cooper 04/27/2020, 2:03 PM

## 2020-04-27 NOTE — Progress Notes (Signed)
Martha Ortega Progress Note  Subjective:   SCr up to 4.7, K 4.2, HCO3 24  0.6L UOP yesterday, furosemide 80 BID  Pt w/o c/o, son at bedside  No N/V  Vitals:   04/27/20 0900 04/27/20 1000 04/27/20 1100 04/27/20 1300  BP: (!) 150/107 (!) 154/77    Pulse: 94 (!) 101 (!) 102   Resp: (!) 21 18 15   Temp:    97.9 F (36.6 C)  TempSrc:    Axillary  SpO2: 96% 100% 100%   Weight:      Height:        Exam: Gen elderly female, Chest dec'd at R base, L side clear RRR no MRG Abd large midline wound dressed, and mid abd ostomy intact GU foley cath w/ small amts clear urine Ext diffuse sig 2+ bilat LE edema Neuro is alert, nonfocal    Home meds:  - asa 81/  crestor 20  - hydralazine 50 bid/ losartan 50 qd/ toprol xl 50 qd  - prn's/ vitamins/ supplements     Date              Creat               eGFR    2008- 2013   1.2- 1.4    2014- 2018   1.3- 1.7            36- 52, stage IIIb    2019             1.80                 35    2020             1.78                 33, stage IIIb    10/2019          2.06                 28, stage IV    04/06/20       2.38                 18, stage IV    03/25/2020       2.09                 23    04/23/20       2.54                 18    UA 12/29 - negative   UNa <10, UCr 122    CT abd pelv no contrast 12/20 - ADRENALS/URINARY TRACT: The adrenal glands are normal. No hydronephrosis, nephroureterolithiasis or solid renal mass. The urinary bladder is normal for degree of distention    CXR 12/29 - IMPRESSION: 1. Rounded gas lucencies projecting over right upper quadrant, suspicious for pneumoperitoneum. Repeat CT of the abdomen and pelvis may be useful. 2. Bibasilar consolidation, likely atelectasis. Trace right pleural effusion. 3. Enteric catheter as above.     Assessment/ Plan: 1. AKI on CKD IV - b/l creat 2.0 from July 2021,AKI likely due to post-op shock/ hypoperfusion.  No nephrotoxins noted.  UA negative and renal US/ CT w/o  obstruction. Admit creat 1.8  To 4.7 this am  but UOP better on IV lasix, No indication for RRT at this time. Hopefully turning the corner.   2. Diverticulitis - sp total colectomy on 12/29 and end ileostomy   3. Shock - postop, getting TNA, off pressors, on IV  Zosyn  4. H/o CVA 5. Anemia - per primary, transfuse prn 6. BP/ volume - BP's low to low-normal. Weights up, follow on liasix 7. GOC -  FC at this time  Rexene Agent  04/27/2020, 1:35 PM   Recent Labs  Lab 04/26/20 0420 04/27/20 0642  K 4.2 4.2  BUN 51* 65*  CREATININE 4.15* 4.74*  CALCIUM 7.6* 8.4*  PHOS 3.8  3.8 3.6  HGB 7.8* 7.6*   Inpatient medications: . bisacodyl  10 mg Rectal Daily  . chlorhexidine  15 mL Mouth Rinse BID  . Chlorhexidine Gluconate Cloth  6 each Topical Daily  . furosemide  80 mg Intravenous Q12H  . heparin injection (subcutaneous)  5,000 Units Subcutaneous Q12H  . insulin aspart  0-6 Units Subcutaneous Q6H  . lip balm  1 application Topical BID  . mouth rinse  15 mL Mouth Rinse q12n4p  . sodium chloride flush  10-40 mL Intracatheter Q12H   . sodium chloride Stopped (04/24/20 1705)  . amiodarone 30 mg/hr (04/27/20 1221)  . methocarbamol (ROBAXIN) IV Stopped (04/16/2020 2300)  . piperacillin-tazobactam Stopped (04/27/20 1147)  . TPN ADULT (ION) 40 mL/hr at 04/27/20 1221  . TPN ADULT (ION)     acetaminophen **OR** acetaminophen, alum & mag hydroxide-simeth, diphenhydrAMINE, HYDROcodone-acetaminophen, lip balm, magic mouthwash, methocarbamol (ROBAXIN) IV, morphine injection, ondansetron **OR** ondansetron (ZOFRAN) IV, phenol, sodium chloride flush

## 2020-04-27 NOTE — Progress Notes (Signed)
PHARMACY - TOTAL PARENTERAL NUTRITION CONSULT NOTE   Indication: prolonged ileus  Patient Measurements: Height: 5\' 2"  (157.5 cm) Weight: 71.4 kg (157 lb 6.5 oz) IBW/kg (Calculated) : 50.1 TPN AdjBW (KG): 59.9 Body mass index is 28.79 kg/m.  Assessment:  86 y/o F with a h/o CKD IV presented to the ED on 12/21 with c/o abdominal pain and blood in stool.  Abdominal CT on 12/21 showed diverticulitis with sinus tract and collection in the low pericolic gutter.  She underwent colectomy with end ileostomy on 12/29. Surgery expects protracted ileus in addition to the fact that patient has gone many days with poor appetite even prior to admission. Pharmacy consulted to initiate TPN.   Glucose / Insulin: very sensitive SSI q4h (used 5 units in 24 hrs) - cbgs goal <150: 149-173 Electrolytes: Na low at 134, Mag elevated at 2.7; K stable at 4.2, phos and CorrCa wnl - CL low at 94, CO2 wnl Renal: SCr up 4.74 (crcl <10), on lasix IV 80mg  q12h LFTs / TGs: AST slightly elevated at 45,  TG 69 (1/3) Prealbumin / albumin: Alb 2.8, Prealbumin <5 Intake / Output; MIVF:  UOP 0.4 ml/kg/hr; drain 384mL, I/O +206 - 1/1: started on lasix 80 mg IV q12h GI Imaging:  - 12/21 abd CT: Diverticulitis of the distal descending colon complicated by sinus tract and 18 mm collection in the low pericolic gutter - 17/40 abd CT: concentration of free air is in the right upper quadrant. This is favored to be post-operative. Surgeries / Procedures:  - 12/29: colectomy with end ileostomy  Central access:  Triple lumen CVC placed 12/31 TPN start date: 12/31   Nutritional Goals (per RD recommendation on 12/31): 12/31: kCal: 8144-8185 , Protein: 90-105, Fluid: 1.5L/day   Goal TPN rate 70 mL/hr (provides 94 g of protein and 1821  kcals per day)  Current Nutrition:  - TPN - 1/3: adv to clears. D/c NG  Plan:  - Increase TPN to 55 mL/hr  - Electrolytes in TPN: incr to 65mEq/L of Na, 67mEq/L of K, 107mEq/L of Ca, remove Mg,  42mmol/L of Phos. Cl:Ac ratio 2:1 - Add standard MVI and trace elements to TPN - add pepcid 20mg  daily to TPN bag - change very sensitive SSI to q6h and adjust as needed  - cmet, phos and mag on 1/4 - Monitor TPN labs on Mon/Thurs  Lynelle Doctor PharmD 04/27/2020,7:07 AM

## 2020-04-27 NOTE — Progress Notes (Signed)
NAME:  Martha Ortega, MRN:  [id number], DOB:  1934-06-06, LOS: 27 ADMISSION DATE:  04/09/2020, CONSULTATION DATE: 04/03/2020 REFERRING MD: Dr. Irine Seal, CHIEF COMPLAINT: Abdominal pain  Brief History:  86 year old with chronic diverticulitis with bowel obstruction from sigmoid stricture that failed medical management s/p ex lap, total colectomy, end ileostomy 12/29 who was hypotensive post-op.   History of Present Illness:  Admitted 12/21 with LLQ pain and bloody stool. CT abdomen/pelivs on admission with diverticulitis of the distal descending colon complicated by a sinus tract and 18 mm collection in the low pericolic gutter. Managed conservatively with NG tube. Liquid diet, IVF. Repeat CT AP 12/25 appeared mildly worse with wall thickening, no frank perforation. Was not taking PO so taken to OR 12/29 for surgery. Now hypotensive despite IVF boluses.   Past Medical History:  Hypertension Diverticulitis  Significant Hospital Events:  Exploratory laparotomy 12/29 Started TPN 04/24/2020 Off pressors 04/25/20 Consults:  General surgery PCCM  Procedures:  Ex lap, total colectomy and end ileostomy 12/29  Significant Diagnostic Tests:  CT abdomen 12/29  Micro Data:  12/2p UCx, BcX x 2 pending  Antimicrobials:  Zosyn 12/26>> Eraxis 12/31>> Vancomycin 12/31>>  Interim History / Subjective:  No significant overnight events. Very edematous. Feeling a bit better.  Objective   Blood pressure 108/64, pulse 98, temperature 97.6 F (36.4 C), temperature source Oral, resp. rate 12, height 5\' 2"  (1.575 m), weight 71.4 kg, SpO2 100 %. CVP:  [7 mmHg-21 mmHg] 11 mmHg      Intake/Output Summary (Last 24 hours) at 04/27/2020 0933 Last data filed at 04/27/2020 0800 Gross per 24 hour  Intake 2054.16 ml  Output 935 ml  Net 1119.16 ml   Filed Weights   04/23/20 1036 04/24/20 0500 04/26/20 0500  Weight: 67.9 kg 69.3 kg 71.4 kg    Examination: General: Frail, appears  comfortable HENT: Moist oral mucosa Lungs: Decreased air movement bilaterally, clear breath sounds Cardiovascular: S1-S2 appreciated Abdomen: Postsurgical changes, ostomy with some output Extremities: Warm and dry Neuro: Alert and oriented, moving all extremities  Resolved Hospital Problem list     Assessment & Plan:  Septic shock, Hypovolemic shock: resolved. Secondary to intra-abdominal sepsis. -On vancomycin, antifungal, Zosyn  New atrial fibrillation with RVR -reportedly decompensated with BB -Currently on amiodarone drip -See if can do amio drip on other unit vs re-trial IV BB per card recommendations  Acute kidney injury: hypervolemic.  -Avoid nephrotoxic's -Renal dose medications -Unfortunately, uptrending BUN/creatinine -Positive fluid balance -Feel needs aggressive diuresis, achieve net negative daily balance, defer to nephrology  Bowel obstruction due to sigmoid stricture and contained perforation History of diverticulosis -Continue TPN at present  Acute blood loss anemia -Transfuse per protocol  We will sign off.  Best practice (evaluated daily)  Diet: N.p.o., TPN Pain/Anxiety/Delirium protocol (if indicated): Tylenol, morphine as needed VAP protocol (if indicated): Not indicated DVT prophylaxis: SCD GI prophylaxis: not indicated, on pepcid Glucose control: SSI Mobility: Bedrest Disposition: Consider de-escalation out of ICU/stepdown given relative clinical stability  Goals of Care:  Per primary  Labs   CBC: Recent Labs  Lab 04/23/20 0251 04/24/20 0231 04/25/20 0442 04/26/20 0420 04/27/20 0642  WBC 12.3* 17.9* 22.9* 22.7* 20.2*  NEUTROABS 11.2* 16.1* 20.3* 19.7* 16.7*  HGB 9.6* 8.3* 7.9* 7.8* 7.6*  HCT 28.5* 25.3* 23.3* 22.6* 21.8*  MCV 86.1 86.6 85.3 83.4 82.9  PLT 399 330 294 265 144    Basic Metabolic Panel: Recent Labs  Lab 04/23/20 0251 04/24/20 0231 04/25/20  4656 04/25/20 1758 04/26/20 0420 04/27/20 0642  NA 135 137 133*   134* 133* 131* 134*  K 4.1 4.3 3.9  3.9 4.0 4.2 4.2  CL 106 101 95*  96* 95* 94* 94*  CO2 17* 20* 22  24 24 24 24   GLUCOSE 95 107* 243*  245* 188* 244* 173*  BUN 25* 32* 38*  40* 46* 51* 65*  CREATININE 2.54* 3.48* 3.98*  3.82* 4.17* 4.15* 4.74*  CALCIUM 7.5* 7.3* 7.3*  7.3* 7.5* 7.6* 8.4*  MG 1.6* 3.1* 2.7*  --  2.8* 2.7*  PHOS 3.9 4.5 3.9  4.2  --  3.8  3.8 3.6   GFR: Estimated Creatinine Clearance: 8.3 mL/min (A) (by C-G formula based on SCr of 4.74 mg/dL (H)). Recent Labs  Lab 04/15/2020 2234 04/23/20 0023 04/23/20 0251 04/24/20 0231 04/25/20 0442 04/26/20 0420 04/26/20 0421 04/27/20 0642  PROCALCITON 33.28  --  38.94 51.24 38.17 32.21  --   --   WBC 8.4  --  12.3* 17.9* 22.9* 22.7*  --  20.2*  LATICACIDVEN 2.5* 1.8  --   --   --   --  1.3  --     Liver Function Tests: Recent Labs  Lab 04/02/2020 2234 04/23/20 0251 04/24/20 0231 04/25/20 0442 04/26/20 0420 04/27/20 0642  AST 33  --   --  57*  --  45*  ALT 14  --   --  23  --  24  ALKPHOS 30*  --   --  83  --  84  BILITOT 0.9  --   --  0.8  --  0.9  PROT 4.0*  --   --  4.9*  --  5.8*  ALBUMIN 1.8* 2.4* 2.0* 2.0*  2.0* 1.7* 2.8*   No results for input(s): LIPASE, AMYLASE in the last 168 hours. No results for input(s): AMMONIA in the last 168 hours.  ABG No results found for: PHART, PCO2ART, PO2ART, HCO3, TCO2, ACIDBASEDEF, O2SAT   Coagulation Profile: No results for input(s): INR, PROTIME in the last 168 hours.  Cardiac Enzymes: No results for input(s): CKTOTAL, CKMB, CKMBINDEX, TROPONINI in the last 168 hours.  HbA1C: Hgb A1c MFr Bld  Date/Time Value Ref Range Status  04/23/2020 02:51 AM 5.2 4.8 - 5.6 % Final    Comment:    (NOTE) Pre diabetes:          5.7%-6.4%  Diabetes:              >6.4%  Glycemic control for   <7.0% adults with diabetes   11/25/2010 09:33 AM 5.5 4.6 - 6.5 % Final    Comment:    Glycemic Control Guidelines for People with Diabetes:Non Diabetic:  <6%Goal of Therapy:  <7%Additional Action Suggested:  >8%     CBG: Recent Labs  Lab 04/26/20 1535 04/26/20 2010 04/27/20 0050 04/27/20 0629 04/27/20 0744  GLUCAP 149* 171* 172* 173* 157*    Review of Systems:   Some abdominal discomfort  Past Medical History:  She,  has a past medical history of Abnormal blood findings, Allergic rhinitis, History of diverticulitis of colon, Hyperglycemia (09), Hyperlipidemia, Hypertension, and Stroke (cerebrum) ([place]).   Surgical History:   Past Surgical History:  Procedure Laterality Date  . ABDOMINAL HYSTERECTOMY    . APPENDECTOMY     Patient states not sure if this is true  . barium swallow  10/18/03  . CARDIOVASCULAR STRESS TEST  07/21/04  . COLON RESECTION N/A 04/20/2020   Procedure: TOTAL ABDOMINAL  COLECTOMY WITH ILEOSTOMY REPAIR OF STOMACH INJURY;  Surgeon: Kinsinger, Arta Bruce, MD;  Location: [facility];  Service: General;  Laterality: N/A;  . CYSTECTOMY  93   R axilla  . CYSTECTOMY  97   R breast     Social History:   reports that she quit smoking about 42 years ago. She has never used smokeless tobacco. She reports that she does not drink alcohol and does not use drugs.   Family History:  Her family history includes Arthritis in her mother and sister; Diabetes in her sister; Hypertension in her father; Lupus in her sister; Prostate cancer in her brother; Stroke in her father.   Allergies Allergies  Allergen Reactions  . Amlodipine Swelling    Throat and mouth swelling  . Fluticasone Propionate     REACTION: does not help  . Loratadine     REACTION: does not work  . Lovastatin     REACTION: was not effective  . Omeprazole Swelling    Throat, lips, and mouth swelling  . Zocor [Simvastatin - High Dose]     Mouth and throat swelled

## 2020-04-27 NOTE — Progress Notes (Addendum)
PROGRESS NOTE   Martha Ortega  IWL:[id number] DOB: 11-May-1934 DOA: 04/24/2020 PCP: [name], MD  Brief Narrative:   86 year old black female HTN, HLD, prior stroke on aspirin, glaucoma Developed intermittent abdominal pain nausea some vomiting saw PCP 04/06/2020 Rx Augmentin p.o. twice daily Admitted with refractory abdominal pain WBC 23 CT scan descending colonic diverticulitis left sinus tract small gas collection no overt perforation GI was initially consulted-noted incomplete colonoscopies in the past (2007 attempted but significant tortuosity severe narrowing high risk of perforation-barium enema as follow-up showed segmental narrowing) Eventually after conservative management inability to take p.o. and having to place an NG tube she was taken to the OR 04/15/2020 but became hypotensive despite IV fluid boluses  Assessment & Plan:   Principal Problem:   Severe sepsis with septic shock ([place]) Active Problems:   Hyperlipidemia   CKD (chronic kidney disease), stage IV ([place])   Diverticulitis of sigmoid colon with abscess   Aortic atherosclerosis ([place])   Blood in stool   ABLA (acute blood loss anemia)   Pericolonic abscess due to diverticulitis   Large bowel obstruction ([place])   Cerebrovascular accident (CVA) ([place])   Hypomagnesemia   Hypokalemia   Sepsis ([place])   Postoperative hypovolemic shock   Pneumoperitoneum   Renal failure (ARF), acute on chronic (HCC)   Elevated troponin   Ureter injury   1. Severe sepsis septic shock secondary to refractory diverticulitis with underlying sigmoid stricture a. General surgery managing removed NG tube 1/3--continuing JP drain suctioning b. TPN to start c. Started Eraxis 100 daily 12/31 for spiking fevers but will discontinue today as cultures do not show this d. Continue Zosyn  every 8 since 12/21 and defer de-escalation at this time 2. Entrapment of left ureter was ruled out 3. Electrolyte disturbances 4. Acute oliguric  likely ATN superimposed on chronic kidney disease stage IV -FeNA this admission 0.2% a. electrolytes fair and improved b. Appreciate nephrology assistance regarding further needs-continues on Lasix 80 twice daily, received albumin every 8 04/26/2020 c. Net positive +152 cc weight 59.9-->71.4 5. Hypotension secondary to shock in the setting of prior hypertension 6. Grade 1 diastolic dysfunction EF 17-40% this admission 7. Elevated troponin 8. Paroxysmal A. fib found postoperatively this admission CHADS2 score >6 a. Norepinephrine discontinued 04/25/2020 and blood pressures are improved b. Remains on IV amiodarone at 30 mg/h-LFTs are reasonable c. Was intolerant to beta-blocker during hospital stay with hypotension d. Holding aspirin at this time on anticoagulation at this time e. Cozaar 50 from prior to admission on hold 9. Unclear if GI bleed or anemia critical illness a. Check stools if appear bloody would Hemoccult-so far no stool in bag b. Likely more in keeping with anemia critical illness postop state 10. Debility post ICU syndrome 11. Hyperglycemia a. Nutrition input, output therapy etc. swelling could be either from anasarca from poor protein stores or volume overload b. Therapy to follow last seen 04/24/2020 c. Continue sliding scale supplementation-need to discuss addition of scheduled insulin  DVT prophylaxis: None at this time other than SCD Code Status: Full Family Communication: No family present today-I will update Disposition:  Status is: Inpatient  Remains inpatient appropriate because:Hemodynamically unstable, Persistent severe electrolyte disturbances and IV treatments appropriate due to intensity of illness or inability to take PO   Dispo: The patient is from: Home              Anticipated d/c is to: SNF most likely  Anticipated d/c date is: > 3 days              Patient currently is not medically stable to d/c.   Consultants:   General  surgery  Cardiology  Nephrology  Critical care medicine  Procedures: 12/13 treated with Augmentin for 2 weeks for diverticulitis 12/21 admission for diverticulitis, refractory to therapy 12/29 failed medical therapy.  Total abdominal colectomy with end ileostomy.  Complicated by stomach injury and left ureter entrapment, repaired intraoperatively. 12/30 transferred to ICU for hypotension, nephrology and cardiology consulted 12/31 Central venous catheter insertion, TPN initiation, started on amiodarone, Eraxis and vancomycin added to Zosyn, also started on pressors 1/1 off of pressors. 1/3 NG tube out, TPN started, Eraxis discontinued  Antimicrobials: As above   Subjective: Awake alert speech slightly unintelligible but moving 4 limbs equally quite weak No chest pain complains of upper extremity swelling and lower extremity swelling  Objective: Vitals:   04/27/20 0000 04/27/20 0200 04/27/20 0400 04/27/20 0600  BP: (!) 148/61 136/81 131/65 (!) 153/128  Pulse: (!) 102  (!) 105 99  Resp: 13 15 17 18   Temp: 97.6 F (36.4 C)     TempSrc: Oral     SpO2: 100%  94% 91%  Weight:      Height:        Intake/Output Summary (Last 24 hours) at 04/27/2020 0659 Last data filed at 04/27/2020 1610 Gross per 24 hour  Intake 1201.67 ml  Output 995 ml  Net 206.67 ml   Filed Weights   04/23/20 1036 04/24/20 0500 04/26/20 0500  Weight: 67.9 kg 69.3 kg 71.4 kg    Examination:  EOMI NCAT no focal deficit CTA B S1-S2 no murmur no rub no gallop Abdomen soft ostomy right upper quadrant with bandage that was covered on abdomen Lower extremit edema present Neurologically grossly moving all 4 limbs  Data Reviewed: I have personally reviewed following labs and imaging studies WBC 20.2 Hemoglobin 7.6 Platelet 178 BUNs/creatinine 51/4.1--->65/4.7 Magnesium 2.7 LFTs 45/24 Sodium 134  Radiology Studies: DG Abd 1 View  Result Date: 04/25/2020 CLINICAL DATA:  NG tube placement EXAM:  ABDOMEN - 1 VIEW COMPARISON:  04/25/2020 FINDINGS: Partially visualized central venous catheter tip over the distal SVC. Esophageal tube tip overlies the mid gastric region. Airspace disease at both lung bases. Incompletely visualized air-filled bowel in the mid abdomen. IMPRESSION: Esophageal tube tip overlies the mid gastric region. Electronically Signed   By: Donavan Foil M.D.   On: 04/25/2020 18:16     Scheduled Meds: . bisacodyl  10 mg Rectal Daily  . chlorhexidine  15 mL Mouth Rinse BID  . Chlorhexidine Gluconate Cloth  6 each Topical Daily  . furosemide  80 mg Intravenous Q12H  . heparin injection (subcutaneous)  5,000 Units Subcutaneous Q12H  . insulin aspart  0-6 Units Subcutaneous Q6H  . lip balm  1 application Topical BID  . mouth rinse  15 mL Mouth Rinse q12n4p  . sodium chloride flush  10-40 mL Intracatheter Q12H   Continuous Infusions: . sodium chloride Stopped (04/24/20 1705)  . amiodarone 30 mg/hr (04/27/20 1221)  . methocarbamol (ROBAXIN) IV Stopped (04/17/2020 2300)  . piperacillin-tazobactam Stopped (04/27/20 1147)  . TPN ADULT (ION) 40 mL/hr at 04/27/20 1221  . TPN ADULT (ION)       LOS: 13 days    Time spent: [place], MD [facility] To contact the attending provider between 7A-7P or the covering provider during after hours 7P-7A, please log  into the web site [email or web address] and access using universal [place] password for that web site. If you do not have the password, please call the hospital operator.  04/27/2020, 6:59 AM

## 2020-04-27 NOTE — Progress Notes (Signed)
Progress Note  Patient Name: Martha Ortega Date of Encounter: 04/27/2020  [facility] Cardiologist: Buford Dresser, MD   Subjective  Sitting up in bed. Continues to feel fatigued. Receiving education on how to manage her ostomy at home.  Remains in rate controlled Afib Blood pressures improved; off pressors Cr up-trending to 4.74 HgB overall stable at 7.6  Inpatient Medications    Scheduled Meds: . bisacodyl  10 mg Rectal Daily  . chlorhexidine  15 mL Mouth Rinse BID  . Chlorhexidine Gluconate Cloth  6 each Topical Daily  . furosemide  80 mg Intravenous Q12H  . heparin injection (subcutaneous)  5,000 Units Subcutaneous Q12H  . insulin aspart  0-6 Units Subcutaneous Q6H  . lip balm  1 application Topical BID  . mouth rinse  15 mL Mouth Rinse q12n4p  . sodium chloride flush  10-40 mL Intracatheter Q12H   Continuous Infusions: . sodium chloride Stopped (04/24/20 1705)  . amiodarone 30 mg/hr (04/27/20 0800)  . anidulafungin Stopped (04/26/20 1235)  . methocarbamol (ROBAXIN) IV Stopped (04/04/2020 2300)  . norepinephrine (LEVOPHED) Adult infusion Stopped (04/25/20 0842)  . piperacillin-tazobactam Stopped (04/27/20 0709)  . TPN ADULT (ION) 40 mL/hr at 04/27/20 0800  . TPN ADULT (ION)     PRN Meds: acetaminophen **OR** acetaminophen, alum & mag hydroxide-simeth, diphenhydrAMINE, HYDROcodone-acetaminophen, lip balm, magic mouthwash, methocarbamol (ROBAXIN) IV, morphine injection, ondansetron **OR** ondansetron (ZOFRAN) IV, phenol, sodium chloride flush   Vital Signs    Vitals:   04/27/20 0600 04/27/20 0700 04/27/20 0800 04/27/20 0900  BP: (!) 153/128 108/64  (!) 150/107  Pulse: 99 98 (!) 103 94  Resp: 18 12 16  (!) 21  Temp:  97.6 F (36.4 C)    TempSrc:  Oral    SpO2: 91% 100% 94% 96%  Weight:      Height:        Intake/Output Summary (Last 24 hours) at 04/27/2020 1026 Last data filed at 04/27/2020 0800 Gross per 24 hour  Intake 2054.16 ml  Output 1255 ml   Net 799.16 ml   Last 3 Weights 04/26/2020 04/24/2020 04/23/2020  Weight (lbs) 157 lb 6.5 oz 152 lb 12.5 oz 149 lb 11.1 oz  Weight (kg) 71.4 kg 69.3 kg 67.9 kg      Telemetry    Rate controlled Afib - Personally Reviewed  ECG    No new tracing - Personally Reviewed  Physical Exam   GEN: No acute distress.   Neck: No JVD Cardiac: Irregularly irregular, no murmurs, rubs, or gallops.  Respiratory: Shallow breathing but clear GI: Soft, ostomy in place  MS: 1+ pitting edema. Warm Neuro:  Nonfocal  Psych: Normal affect   Labs    High Sensitivity Troponin:   Recent Labs  Lab 04/08/2020 2234 04/23/20 0023 04/23/20 0251  TROPONINIHS 106* 128* 139*      Chemistry Recent Labs  Lab 04/15/2020 2234 04/23/20 0251 04/25/20 0442 04/25/20 1758 04/26/20 0420 04/27/20 0642  NA 137   < > 133*  134* 133* 131* 134*  K 4.1   < > 3.9  3.9 4.0 4.2 4.2  CL 109   < > 95*  96* 95* 94* 94*  CO2 17*   < > 22  24 24 24 24   GLUCOSE 99   < > 243*  245* 188* 244* 173*  BUN 24*   < > 38*  40* 46* 51* 65*  CREATININE 2.53*   < > 3.98*  3.82* 4.17* 4.15* 4.74*  CALCIUM 7.4*   < >  7.3*  7.3* 7.5* 7.6* 8.4*  PROT 4.0*  --  4.9*  --   --  5.8*  ALBUMIN 1.8*   < > 2.0*  2.0*  --  1.7* 2.8*  AST 33  --  57*  --   --  45*  ALT 14  --  23  --   --  24  ALKPHOS 30*  --  83  --   --  84  BILITOT 0.9  --  0.8  --   --  0.9  GFRNONAA 18*   < > 11*  11* 10* 10* 9*  ANIONGAP 11   < > 16*  14 14 13  16*   < > = values in this interval not displayed.     Hematology Recent Labs  Lab 04/25/20 0442 04/26/20 0420 04/27/20 0642  WBC 22.9* 22.7* 20.2*  RBC 2.73* 2.71* 2.63*  HGB 7.9* 7.8* 7.6*  HCT 23.3* 22.6* 21.8*  MCV 85.3 83.4 82.9  MCH 28.9 28.8 28.9  MCHC 33.9 34.5 34.9  RDW 17.8* 18.3* 18.6*  PLT 294 265 178    BNPNo results for input(s): BNP, PROBNP in the last 168 hours.   DDimer No results for input(s): DDIMER in the last 168 hours.   Radiology    DG Abd 1 View  Result  Date: 04/25/2020 CLINICAL DATA:  NG tube placement EXAM: ABDOMEN - 1 VIEW COMPARISON:  04/25/2020 FINDINGS: Partially visualized central venous catheter tip over the distal SVC. Esophageal tube tip overlies the mid gastric region. Airspace disease at both lung bases. Incompletely visualized air-filled bowel in the mid abdomen. IMPRESSION: Esophageal tube tip overlies the mid gastric region. Electronically Signed   By: Donavan Foil M.D.   On: 04/25/2020 18:16    Cardiac Studies   Echo 04/21/20  1. Left ventricular ejection fraction, by estimation, is 60 to 65%. The  left ventricle has normal function. The left ventricle has no regional  wall motion abnormalities. There is mild concentric left ventricular  hypertrophy. Left ventricular diastolic  parameters are consistent with Grade I diastolic dysfunction (impaired  relaxation).  2. Right ventricular systolic function is normal. The right ventricular  size is normal. There is normal pulmonary artery systolic pressure. The  estimated right ventricular systolic pressure is 47.0 mmHg.  3. The mitral valve is normal in structure. No evidence of mitral valve  regurgitation. No evidence of mitral stenosis.  4. The aortic valve is normal in structure. Aortic valve regurgitation is  not visualized. No aortic stenosis is present.  5. The inferior vena cava is normal in size with greater than 50%  respiratory variability, suggesting right atrial pressure of 3 mmHg.   Patient Profile     86 y.o. female with PMH of CVA, CKD stage 4, hypertension, hyperlipidemia, complicated diverticulitis s/p surgery 03/31/2020 who is being followed for elevated troponins and atrial fibrillation.  Assessment & Plan    #Newly Diagnosed Afib: CHADs-vasc 6. Developed in the post-operative period. Received IV amiodarone due to hypotension requiring pressor support. Remains in rate controlled Afib. -Continue amiodarone IV for now as not tolerating PO -Patient would  benefit from anticoagulation going forward; will hold off for now given anemia and post-operative state -Goal to transition to PO medications for rate control once able. Would continue IV amiodarone for now -Once able to tolerate AC and if remains in Afib, could consider DCCV   #Sepsis complicated by hypovolemic shock #Diverticulitis s/p ex-lap and total colectomy and end ileostomy now with  bowel obstruction due to sigmoid stricture with contained perforation Patient presented with LLQ pain and bloody stool with admission CT abdomen/pelivs withdiverticulitis of the distal descending colon complicated by a sinus tract and 18 mm collection in the low pericolic gutter. Initially managed conservatively with NG tube. Repeat CT AP 12/25 appeared mildly worse with wall thickening, no frank perforation. Taken to OR 12/29 for ex lap, total colectomy, end ileostomy with post-op course complicated by hypotension and Afib with RVR. -Continue ABX per CCM and surgery team -Off pressors  #Elevated troponin: Occurred in the setting of septic and hypovolemic shock. TTE with normal EF. Suspect demand ischemia.  -TTE with normal EF and no WMA -No prior cardiac history -Can proceed with further work-up (myoview) as out-patient once improved from a clinical standpoint  #AKI on CKD IV:  Likely due to post-op shock and hypoperfusion. -Management per nephrology -Lasix per nephrology -Renally dose medications -Monitor I/Os and UOP  #HLD: -Resume rosuvastatin once able  #Anemia: -Holding ASA -No AC for now given anemia   CRITICAL CARE TIME: I have spent a total of 45 minutes with patient reviewing hospital notes, telemetry, EKGs, labs and examining the patient as well as establishing an assessment and plan that was discussed with the patient.  > 50% of time was spent in direct patient care. The patient is critically ill with multi-organ system failure and requires high complexity decision making for assessment  and support, frequent evaluation and titration of therapies, application of advanced monitoring technologies and extensive interpretation of multiple databases.       For questions or updates, please contact [place] Please consult [email or web address] for contact info under        Signed, Freada Bergeron, MD  04/27/2020, 10:26 AM

## 2020-04-28 DIAGNOSIS — A419 Sepsis, unspecified organism: Secondary | ICD-10-CM | POA: Diagnosis not present

## 2020-04-28 DIAGNOSIS — I480 Paroxysmal atrial fibrillation: Secondary | ICD-10-CM | POA: Diagnosis not present

## 2020-04-28 DIAGNOSIS — R6521 Severe sepsis with septic shock: Secondary | ICD-10-CM | POA: Diagnosis not present

## 2020-04-28 DIAGNOSIS — R778 Other specified abnormalities of plasma proteins: Secondary | ICD-10-CM | POA: Diagnosis not present

## 2020-04-28 LAB — COMPREHENSIVE METABOLIC PANEL
ALT: 22 U/L (ref 0–44)
AST: 35 U/L (ref 15–41)
Albumin: 2.1 g/dL — ABNORMAL LOW (ref 3.5–5.0)
Alkaline Phosphatase: 79 U/L (ref 38–126)
Anion gap: 14 (ref 5–15)
BUN: 75 mg/dL — ABNORMAL HIGH (ref 8–23)
CO2: 24 mmol/L (ref 22–32)
Calcium: 7.8 mg/dL — ABNORMAL LOW (ref 8.9–10.3)
Chloride: 96 mmol/L — ABNORMAL LOW (ref 98–111)
Creatinine, Ser: 4.8 mg/dL — ABNORMAL HIGH (ref 0.44–1.00)
GFR, Estimated: 9 mL/min — ABNORMAL LOW (ref >60)
Glucose, Bld: 194 mg/dL — ABNORMAL HIGH (ref 70–99)
Potassium: 4.2 mmol/L (ref 3.5–5.1)
Sodium: 134 mmol/L — ABNORMAL LOW (ref 135–145)
Total Bilirubin: 0.6 mg/dL (ref 0.3–1.2)
Total Protein: 5.1 g/dL — ABNORMAL LOW (ref 6.5–8.1)

## 2020-04-28 LAB — CULTURE, BLOOD (ROUTINE X 2)
Culture: NO GROWTH
Culture: NO GROWTH
Special Requests: ADEQUATE
Special Requests: ADEQUATE

## 2020-04-28 LAB — GLUCOSE, CAPILLARY
Glucose-Capillary: 184 mg/dL — ABNORMAL HIGH (ref 70–99)
Glucose-Capillary: 187 mg/dL — ABNORMAL HIGH (ref 70–99)
Glucose-Capillary: 187 mg/dL — ABNORMAL HIGH (ref 70–99)
Glucose-Capillary: 196 mg/dL — ABNORMAL HIGH (ref 70–99)

## 2020-04-28 LAB — MAGNESIUM: Magnesium: 2.4 mg/dL (ref 1.7–2.4)

## 2020-04-28 LAB — HEPARIN LEVEL (UNFRACTIONATED): Heparin Unfractionated: 0.18 IU/mL — ABNORMAL LOW (ref 0.30–0.70)

## 2020-04-28 LAB — PHOSPHORUS: Phosphorus: 4 mg/dL (ref 2.5–4.6)

## 2020-04-28 MED ORDER — TRAVASOL 10 % IV SOLN
INTRAVENOUS | Status: AC
  Filled 2020-04-28: qty 940.8

## 2020-04-28 MED ORDER — HYDRALAZINE HCL 20 MG/ML IJ SOLN: 10.0000 mg | INTRAMUSCULAR | Status: DC | PRN

## 2020-04-28 MED ORDER — HEPARIN (PORCINE) 25000 UT/250ML-% IV SOLN
1330.0000 [IU]/h | INTRAVENOUS | Status: DC
  Administered 2020-04-28: 14:00:00 1000 [IU]/h via INTRAVENOUS
  Administered 2020-04-29 – 2020-04-30 (×2): 1350 [IU]/h via INTRAVENOUS
  Filled 2020-04-28 (×3): qty 250

## 2020-04-28 NOTE — Evaluation (Signed)
Occupational Therapy Evaluation Patient Details Name: Martha Ortega MRN: [id number] DOB: [birth year] Today's Date: 04/28/2020    History of Present Illness Pt admitted with abdominal pain, blood in stool and sepsis 2* IB related to diverticulitis.  Pt with hx of CVA ~ 5 yrs ago with residual L UE and chest "tightness" and L foot "numbness.  Pt s/p total abdominal colectomy with end ileostomy 03/30/2020   Clinical Impression   PTA patient was living in a private residence with family and was independent with ADLs/IADLs sans use of AD. Patient currently presents below baseline requiring Mod A for bed mobility and stand-pivot transfers wit face-to-face technique. Patient also limited by generalized weakness, decreased cognition, decreased static/dynamic standing balance and increased need for external assist during self-care tasks. Patient would benefit from continued acute OT services in prep for safe d/c to next level of care with recommendation for SNF rehab.     Follow Up Recommendations  SNF    Equipment Recommendations  Other (comment) (TBD)    Recommendations for Other Services       Precautions / Restrictions Precautions Precautions: Fall;Other (comment) Precaution Comments: New Ileostomy; JP drain on R Restrictions Weight Bearing Restrictions: No      Mobility Bed Mobility Overal bed mobility: Needs Assistance Bed Mobility: Supine to Sit Rolling: Mod assist         General bed mobility comments: Patient able to advnace BLE to EOB with assist to bring trunk upright. Increased time/effort with HOB elevated.    Transfers Overall transfer level: Needs assistance Equipment used: Rolling walker (2 wheeled) Transfers: Sit to/from [facility] Sit to Stand: Mod assist;From elevated surface Stand pivot transfers: Mod assist       General transfer comment: Face to face technique with cues for hand placement and upright posture.    Balance Overall  balance assessment: Needs assistance Sitting-balance support: No upper extremity supported;Feet supported Sitting balance-Leahy Scale: Fair     Standing balance support: Bilateral upper extremity supported Standing balance-Leahy Scale: Poor Standing balance comment: Reliant on external assist.                           ADL either performed or assessed with clinical judgement   ADL Overall ADL's : Needs assistance/impaired                     Lower Body Dressing: Maximal assistance Lower Body Dressing Details (indicate cue type and reason): Max A to adjust footwear seated EOB. Toilet Transfer: Maximal Print production planner Details (indicate cue type and reason): Simulated with stand-pivot transfer to recliner with face-to-face technique. Patient with anterior bias requiring cues for upright posture.                 Vision Patient Visual Report: No change from baseline Vision Assessment?: No apparent visual deficits     Perception     Praxis      Pertinent Vitals/Pain Pain Assessment: Faces Faces Pain Scale: Hurts little more Pain Location: abdomen Pain Descriptors / Indicators: Aching;Sore Pain Intervention(s): Monitored during session;Repositioned     Hand Dominance Right   Extremity/Trunk Assessment Upper Extremity Assessment Upper Extremity Assessment: Generalized weakness LUE Deficits / Details: c/o "tightness" with functional strength but decresased fine motor LUE Coordination: decreased fine motor   Lower Extremity Assessment Lower Extremity Assessment: Defer to PT evaluation   Cervical / Trunk Assessment Cervical / Trunk Assessment: Normal   Communication Communication  Communication: No difficulties;HOH   Cognition Arousal/Alertness: Awake/alert Behavior During Therapy: WFL for tasks assessed/performed Overall Cognitive Status: Within Functional Limits for tasks assessed                                  General Comments: A&Ox3   General Comments  Sister present at bedside throughout assessment.    Exercises     Shoulder Instructions      Home Living Family/patient expects to be discharged to:: Private residence Living Arrangements: Children Available Help at Discharge: Family Type of Home: House Home Access: Stairs to enter Technical brewer of Steps: 7 Entrance Stairs-Rails: Right Home Layout: One level               Home Equipment: Environmental consultant - 2 wheels;Bedside commode          Prior Functioning/Environment Level of Independence: Independent        Comments: Pt states ambulating sans assistive device        OT Problem List: Decreased strength;Decreased activity tolerance;Impaired balance (sitting and/or standing);Decreased coordination;Decreased cognition;Decreased knowledge of use of DME or AE;Cardiopulmonary status limiting activity;Impaired UE functional use      OT Treatment/Interventions:      OT Goals(Current goals can be found in the care plan section) Acute Rehab OT Goals Patient Stated Goal: To get stronger OT Goal Formulation: With patient Time For Goal Achievement: 05/12/20 Potential to Achieve Goals: Good ADL Goals Pt Will Perform Grooming: with set-up;sitting Pt Will Perform Upper Body Dressing: with set-up;sitting Pt Will Perform Lower Body Dressing: sit to/from stand;with modified independence;with adaptive equipment Pt Will Transfer to Toilet: with modified independence;ambulating;bedside commode Pt Will Perform Toileting - Clothing Manipulation and hygiene: with modified independence;with adaptive equipment;sit to/from stand Additional ADL Goal #1: Patient will follow 1-2 step verbal commands with >95% accuracy in prep for self-care tasks.  OT Frequency:     Barriers to D/C:            Co-evaluation              AM-PAC OT "6 Clicks" Daily Activity     Outcome Measure Help from another person eating meals?: A Little Help  from another person taking care of personal grooming?: A Lot Help from another person toileting, which includes using toliet, bedpan, or urinal?: A Lot Help from another person bathing (including washing, rinsing, drying)?: A Lot Help from another person to put on and taking off regular upper body clothing?: A Lot Help from another person to put on and taking off regular lower body clothing?: A Lot 6 Click Score: 13   End of Session Equipment Utilized During Treatment: Gait belt;Oxygen  Activity Tolerance: Patient tolerated treatment well Patient left: in chair;with call bell/phone within reach;with chair alarm set;with SCD's reapplied  OT Visit Diagnosis: Unsteadiness on feet (R26.81);Muscle weakness (generalized) (M62.81);Other symptoms and signs involving cognitive function;Pain Pain - part of body:  (Abdomen)                Time: 1829-9371 OT Time Calculation (min): 26 min Charges:  OT General Charges $OT Visit: 1 Visit OT Evaluation $OT Eval Moderate Complexity: 1 Mod  Star Cheese H. OTR/L Supplemental OT, Department of rehab services [phone]  [place] Delong R H. 04/28/2020, 1:53 PM

## 2020-04-28 NOTE — Progress Notes (Signed)
[facility] Progress Note  Subjective:   SCr stable 4.8, K 4.2, HCO3 24  2.1 L UOP yesterday, furosemide 80 BID  Pt w/o c/o, sister at bedside  No N/V  Vitals:   04/28/20 0900 04/28/20 1000 04/28/20 1100 04/28/20 1200  BP: (!) 169/84 (!) 124/46 (!) 132/52 (!) 131/98  Pulse: 82 74 78 81  Resp: 17 (!) 9 14 13   Temp:      TempSrc:      SpO2: 97% 100% 100% 98%  Weight:      Height:        Exam: Gen elderly female, in chair Chest diminished bibasilar, nl wob RRR no MRG Abd large midline wound dressed, and mid abd ostomy intact GU foley cath w/ small amts clear urine Ext diffuse sig 2+ bilat LE edema Neuro is alert, nonfocal    Home meds:  - asa 81/  crestor 20  - hydralazine 50 bid/ losartan 50 qd/ toprol xl 50 qd  - prn's/ vitamins/ supplements     Date              Creat               eGFR    2008- 2013   1.2- 1.4    2014- 2018   1.3- 1.7            36- 52, stage IIIb    2019             1.80                 35    2020             1.78                 33, stage IIIb    10/2019          2.06                 28, stage IV    04/06/20       2.38                 18, stage IV    04/12/2020       2.09                 23    04/23/20       2.54                 18    UA 12/29 - negative   UNa <10, UCr 122    CT abd pelv no contrast 12/20 - ADRENALS/URINARY TRACT: The adrenal glands are normal. No hydronephrosis, nephroureterolithiasis or solid renal mass. The urinary bladder is normal for degree of distention    CXR 12/29 - IMPRESSION: 1. Rounded gas lucencies projecting over right upper quadrant, suspicious for pneumoperitoneum. Repeat CT of the abdomen and pelvis may be useful. 2. Bibasilar consolidation, likely atelectasis. Trace right pleural effusion. 3. Enteric catheter as above.     Assessment/ Plan: 1. AKI on CKD IV - b/l creat 2.0 from July 2021,AKI likely due to post-op shock/ hypoperfusion.  No nephrotoxins noted.  UA negative and renal US/ CT  w/o obstruction. Admit creat 1.8  To 4.7 this am  but UOP picking up on IV lasix, likely seeing some GFR recovery.  No indication for RRT at this time. Hopefully turning the corner.   2. Diverticulitis - sp total colectomy on 12/29  and end ileostomy 3. Shock - postop, getting TNA, off pressors, on IV  Zosyn  4. H/o CVA 5. Anemia - per primary, transfuse prn 6. BP/ volume - BP's low to low-normal. Weights stable in past 24h to 48h, follow on lasix 7. GOC -  FC at this time  Rexene Agent  04/28/2020, 1:54 PM   Recent Labs  Lab 04/26/20 0420 04/27/20 0642 04/28/20 0412  K 4.2 4.2 4.2  BUN 51* 65* 75*  CREATININE 4.15* 4.74* 4.80*  CALCIUM 7.6* 8.4* 7.8*  PHOS 3.8  3.8 3.6 4.0  HGB 7.8* 7.6*  --    Inpatient medications: . bisacodyl  10 mg Rectal Daily  . chlorhexidine  15 mL Mouth Rinse BID  . Chlorhexidine Gluconate Cloth  6 each Topical Daily  . furosemide  80 mg Intravenous Q12H  . insulin aspart  0-6 Units Subcutaneous Q6H  . lip balm  1 application Topical BID  . mouth rinse  15 mL Mouth Rinse q12n4p  . sodium chloride flush  10-40 mL Intracatheter Q12H   . sodium chloride 10 mL/hr at 04/28/20 0557  . amiodarone 30 mg/hr (04/28/20 0557)  . heparin    . methocarbamol (ROBAXIN) IV Stopped (04/28/20 0115)  . piperacillin-tazobactam 2.25 g (04/28/20 1209)  . TPN ADULT (ION) 55 mL/hr at 04/28/20 0557  . TPN ADULT (ION)     acetaminophen **OR** acetaminophen, alum & mag hydroxide-simeth, diphenhydrAMINE, HYDROcodone-acetaminophen, lip balm, magic mouthwash, methocarbamol (ROBAXIN) IV, morphine injection, ondansetron **OR** ondansetron (ZOFRAN) IV, phenol, sodium chloride flush

## 2020-04-28 NOTE — Progress Notes (Signed)
PHARMACY - TOTAL PARENTERAL NUTRITION CONSULT NOTE   Indication: prolonged ileus  Patient Measurements: Height: 5\' 2"  (157.5 cm) Weight: 71.4 kg (157 lb 6.5 oz) IBW/kg (Calculated) : 50.1 TPN AdjBW (KG): 59.9 Body mass index is 28.79 kg/m.  Assessment:  86 y/o F with a h/o CKD IV presented to the ED on 12/21 with c/o abdominal pain and blood in stool.  Abdominal CT on 12/21 showed diverticulitis with sinus tract and collection in the low pericolic gutter.  She underwent colectomy with end ileostomy on 12/29. Surgery expects protracted ileus in addition to the fact that patient has gone many days with poor appetite even prior to admission. Pharmacy consulted to initiate TPN.   Glucose / Insulin: very sensitive SSI q4h (used 3 units since TPN rate increased to 55 ml/hr) - cbgs goal <150: 174-194 Electrolytes: Na low but stable at 134, Mag down to 2.4 with Mag removed from TPN; K stable at 4.2, phos and CorrCa wnl - CL low at 96, CO2 wnl Renal: SCr trending up 4.80 (crcl <10), on lasix IV 80mg  q12h LFTs / TGs: LFTs wnl,  TG 69 (1/3) Prealbumin / albumin: Alb 2.8, Prealbumin <5 Intake / Output; MIVF:  UOP 1.2 ml/kg/hr; drain 70 mL, I/O -156 - 1/1: started on lasix 80 mg IV q12h GI Imaging:  - 12/21 abd CT: Diverticulitis of the distal descending colon complicated by sinus tract and 18 mm collection in the low pericolic gutter - 74/12 abd CT: concentration of free air is in the right upper quadrant. This is favored to be post-operative. Surgeries / Procedures:  - 12/29: colectomy with end ileostomy  Central access:  Triple lumen CVC placed 12/31 TPN start date: 12/31   Nutritional Goals (per RD recommendation on 12/31): 12/31: kCal: 8786-7672 , Protein: 90-105, Fluid: 1.5L/day   Goal TPN rate 70 mL/hr (provides 94 g of protein and 1821  kcals per day)  Current Nutrition:  - TPN - 1/3: adv to clear liquid. D/c NG  Plan:  - Increase TPN to goal rate of 70 mL/hr  - Electrolytes in  TPN: continue 20mEq/L of Na, 35mEq/L of K, 66mEq/L of Ca, remove Mg, 27mmol/L of Phos. Cl:Ac ratio 2:1 - Add standard MVI and trace elements to TPN - add pepcid 20mg  daily to TPN bag - change very sensitive SSI to q6h and adjust as needed  - cmet, phos and mag on 1/5 - Monitor TPN labs on Mon/Thurs  Anaelle Dunton P PharmD 04/28/2020,7:19 AM

## 2020-04-28 NOTE — Progress Notes (Signed)
[place] for heparin Indication: atrial fibrillation with RVR  Allergies  Allergen Reactions  . Amlodipine Swelling    Throat and mouth swelling  . Fluticasone Propionate     REACTION: does not help  . Loratadine     REACTION: does not work  . Lovastatin     REACTION: was not effective  . Omeprazole Swelling    Throat, lips, and mouth swelling  . Zocor [Simvastatin - High Dose]     Mouth and throat swelled    Patient Measurements: Height: 5\' 2"  (157.5 cm) Weight: 71.4 kg (157 lb 6.5 oz) IBW/kg (Calculated) : 50.1 Heparin Dosing Weight: 71 kg  Vital Signs: Temp: 97.5 F (36.4 C) (01/04 1600) Temp Source: Oral (01/04 1600) BP: 191/84 (01/04 2100) Pulse Rate: 81 (01/04 2000)  Labs: Recent Labs    04/26/20 0420 04/27/20 0642 04/28/20 0412 04/28/20 2029  HGB 7.8* 7.6*  --   --   HCT 22.6* 21.8*  --   --   PLT 265 178  --   --   HEPARINUNFRC  --   --   --  0.18*  CREATININE 4.15* 4.74* 4.80*  --     Estimated Creatinine Clearance: 8.2 mL/min (A) (by C-G formula based on SCr of 4.8 mg/dL (H)).   Assessment: Patient is an 86 y.o F with a h/o CKD IV presented to the ED on 12/21 with c/o abdominal pain and blood in stool.  Abdominal CT on 12/21 showed diverticulitis with sinus tract and collection in the low pericolic gutter.  She underwent colectomy with end ileostomy on 12/29.  Patient is in afib with RVR.  Pharmacy consulted to start heparin drip for afib.  Today, 04/28/2020: -Initial heparin level 0.18- low on 1000 units/hr - hgb 7.6 and plts 178k on 1/3 - scr elevated and trending up (crcl<10) - last dose of hep SQ given at 9a - No bleeding or infusion related concerns per RN - CBC ordered for tonight overlooked- will re-time with AM labs  Goal of Therapy:  Heparin level 0.3-0.7 units/ml Monitor platelets by anticoagulation protocol: Yes   Plan:  - Increase heparin drip to 1150 units/hr (will not bolus d/t recent  surgery and anemia) - recheck 8 hr heparin level and cbc - monitor for s/sx bleeding   Netta Cedars PharmD, BCPS 04/28/2020,9:12 PM

## 2020-04-28 NOTE — Progress Notes (Signed)
6 Days Post-Op   Subjective/Chief Complaint: No complaints. Tolerated clears   Objective: Vital signs in last 24 hours: Temp:  [97.4 F (36.3 C)-97.9 F (36.6 C)] 97.5 F (36.4 C) (01/04 0800) Pulse Rate:  [59-102] 78 (01/04 0800) Resp:  [9-23] 10 (01/04 0800) BP: (126-190)/(44-162) 141/49 (01/04 0800) SpO2:  [93 %-100 %] 100 % (01/04 0800) Weight:  [71.4 kg] 71.4 kg (01/04 0500) Last BM Date: 04/28/20  Intake/Output from previous day: 01/03 0701 - 01/04 0700 In: 2310.1 [P.O.:350; I.V.:1634; IV Piggyback:326.1] Out: 2465 [Urine:2120; Emesis/NG output:50; Drains:70; Stool:225] Intake/Output this shift: No intake/output data recorded.  General appearance: alert and cooperative Resp: clear to auscultation bilaterally Cardio: regular rate and rhythm GI: soft, mild tenderness. wound clean. ostomy pink and productive  Lab Results:  Recent Labs    04/26/20 0420 04/27/20 0642  WBC 22.7* 20.2*  HGB 7.8* 7.6*  HCT 22.6* 21.8*  PLT 265 178   BMET Recent Labs    04/27/20 0642 04/28/20 0412  NA 134* 134*  K 4.2 4.2  CL 94* 96*  CO2 24 24  GLUCOSE 173* 194*  BUN 65* 75*  CREATININE 4.74* 4.80*  CALCIUM 8.4* 7.8*   PT/INR No results for input(s): LABPROT, INR in the last 72 hours. ABG No results for input(s): PHART, HCO3 in the last 72 hours.  Invalid input(s): PCO2, PO2  Studies/Results: No results found.  Anti-infectives: Anti-infectives (From admission, onward)   Start     Dose/Rate Route Frequency Ordered Stop   04/25/20 1000  anidulafungin (ERAXIS) 100 mg in sodium chloride 0.9 % 100 mL IVPB  Status:  Discontinued        100 mg 78 mL/hr over 100 Minutes Intravenous Every 24 hours 04/24/20 0956 04/27/20 1257   04/24/20 1130  vancomycin (VANCOREADY) IVPB 1500 mg/300 mL        1,500 mg 150 mL/hr over 120 Minutes Intravenous  Once 04/24/20 1038 04/24/20 1440   04/24/20 1100  anidulafungin (ERAXIS) 200 mg in sodium chloride 0.9 % 200 mL IVPB        200  mg 78 mL/hr over 200 Minutes Intravenous  Once 04/24/20 0958 04/24/20 1515   04/24/20 1046  vancomycin variable dose per unstable renal function (pharmacist dosing)  Status:  Discontinued         Does not apply See admin instructions 04/24/20 1046 04/26/20 1041   04/07/2020 1200  piperacillin-tazobactam (ZOSYN) IVPB 2.25 g        2.25 g 100 mL/hr over 30 Minutes Intravenous Every 8 hours 03/28/2020 1138        Assessment/Plan: s/p Procedure(s): TOTAL ABDOMINAL COLECTOMY WITH ILEOSTOMY REPAIR OF STOMACH INJURY (N/A)12/29 Advance diet. Start fulls today -Renal US 12/30 showed no hydronephrosis -Oliguria, Cr up trending - jp remains low volume. Nephrology followingand appreciate assistance in her care -Possible ACS vs demand ischemia note; afib;as per cardiology -Cont JP drain to bulb suction -NPO, MIVF; d/c ng. Start clears-TPN -continue until reliably tolerating goal feeds -PPx: SCDs, ok for chemical dvt ppx from our standpoint Needs PT/OT  LOS: 14 days    Martha Ortega 04/28/2020

## 2020-04-28 NOTE — Progress Notes (Signed)
PROGRESS NOTE   Martha Ortega  HGD:[id number] DOB: 02-May-1934 DOA: 04/05/2020 PCP: [name], MD  Brief Narrative:   86 year old black female HTN, HLD, prior stroke on aspirin, glaucoma Developed intermittent abdominal pain nausea some vomiting saw PCP 04/06/2020 Rx Augmentin p.o. twice daily Admitted with refractory abdominal pain WBC 23 CT scan descending colonic diverticulitis left sinus tract small gas collection no overt perforation GI was initially consulted-noted incomplete colonoscopies in the past (2007 attempted but significant tortuosity severe narrowing high risk of perforation-barium enema as follow-up showed segmental narrowing) Eventually after conservative management inability to take p.o. and having to place an NG tube she was taken to the OR 04/02/2020 but became hypotensive despite IV fluid boluses  Assessment & Plan:   Principal Problem:   Severe sepsis with septic shock ([place]) Active Problems:   Hyperlipidemia   CKD (chronic kidney disease), stage IV ([place])   Diverticulitis of sigmoid colon with abscess   Aortic atherosclerosis ([place])   Blood in stool   ABLA (acute blood loss anemia)   Pericolonic abscess due to diverticulitis   Large bowel obstruction ([place])   Cerebrovascular accident (CVA) ([place])   Hypomagnesemia   Hypokalemia   Sepsis ([place])   Postoperative hypovolemic shock   Pneumoperitoneum   Renal failure (ARF), acute on chronic (H[facility])   Elevated troponin   Ureter injury   1. Severe sepsis septic shock secondary to refractory diverticulitis with underlying sigmoid stricture a. General surgery managing removed NG tube 1/3--continuing JP drain suctioning b. TPN started 1/3 c.  Eraxis 100 daily 12/31 discontinued 1/3--no fever recurrence d. Continue Zosyn  every 8 since 12/21 and defer de-escalation at this time to gen surg 2. Entrapment of left ureter was ruled out 3. Electrolyte disturbances 4. Acute oliguric likely ATN superimposed on chronic  kidney disease stage IV -FeNA this admission 0.2% a. electrolytes fair and improved b. Appreciate nephrology assistance regarding further needs-continues on Lasix 80 twice daily, received albumin every 8 04/26/2020 c. 2.1L Urine out, weight baseline 59.9--> reamains stable at71.4 5. Hypotension secondary to shock in the setting of prior hypertension 6. Grade 1 diastolic dysfunction EF 96-22% this admission 7. Elevated troponin 8. Paroxysmal A. fib found postoperatively this admission CHADS2 score >6 a. Norepinephrine discontinued 04/25/2020 b. Remains on IV amiodarone at 30 mg/h as is not taking PO properly yet c. intolerant to beta-blocker during hospital stay with hypotension d. heparin started 1/4 e. Cozaar 50 from prior to admission on hold 9. Unclear if GI bleed or anemia critical illness a. Putting out liquid nonbloody stool b. Likely more in keeping with anemia critical illness postop state 10. Debility post ICU syndrome 11. Hyperglycemia a. Nutrition input, output therapy etc. swelling could be either from anasarca from poor protein stores or volume overload b. Therapy to follow last seen 04/24/2020 c. Continue sliding scale supplementation-need to discuss addition of scheduled insulin  DVT prophylaxis: Heparin GTT Code Status: Full Family Communication: Called son Mr. Tarri Fuller [phone] and updated him Disposition:  Status is: Inpatient  Remains inpatient appropriate because:Hemodynamically unstable, Persistent severe electrolyte disturbances and IV treatments appropriate due to intensity of illness or inability to take PO   Dispo: The patient is from: Home              Anticipated d/c is to: SNF most likely versus LTAC              Anticipated d/c date is: > 3 days  Patient currently is not medically stable to d/c.   Consultants:   General surgery  Cardiology  Nephrology  Critical care medicine  Procedures: 12/13 treated with Augmentin for 2 weeks for  diverticulitis 12/21 admission for diverticulitis, refractory to therapy 12/29 failed medical therapy.  Total abdominal colectomy with end ileostomy.  Complicated by stomach injury and left ureter entrapment, repaired intraoperatively. 12/30 transferred to ICU for hypotension, nephrology and cardiology consulted 12/31 Central venous catheter insertion, TPN initiation, started on amiodarone, Eraxis and vancomycin added to Zosyn, also started on pressors 1/1 off of pressors. 1/3 NG tube out, TPN started, Eraxis discontinued 1/4-Heparin started, therapy recommending skilled  Antimicrobials: As above   Subjective:  Pleasant coherent sitting out of bed for the first time no fevers no chills Still feels very swollen Sucking down drink without issue  Objective: Vitals:   04/28/20 0900 04/28/20 1000 04/28/20 1100 04/28/20 1200  BP: (!) 169/84 (!) 124/46 (!) 132/52 (!) 131/98  Pulse: 82 74 78 81  Resp: 17 (!) 9 14 13   Temp:      TempSrc:      SpO2: 97% 100% 100% 98%  Weight:      Height:        Intake/Output Summary (Last 24 hours) at 04/28/2020 1323 Last data filed at 04/28/2020 1223 Gross per 24 hour  Intake 1938.14 ml  Output 2270 ml  Net -331.86 ml   Filed Weights   04/24/20 0500 04/26/20 0500 04/28/20 0500  Weight: 69.3 kg 71.4 kg 71.4 kg    Examination:  EOMI NCAT no focal deficit and coherent CTA B without rales rhonchi S1-S2 no murmur no rub no gallop Abdomen soft ostomy right upper quadrant with bandage that was covered on abdomen-ostomy in right upper quadrant has stool in it Lower extremit edema present, quite swollen in upper extremities in addition Neurologically grossly moving all 4 limbs  Data Reviewed: I have personally reviewed following labs and imaging studies WBC 20.2 Hemoglobin 7.6 Platelet 178 BUNs/creatinine 51/4.1--->65/4.7-->75/4.8 Magnesium 2.4 LFTs 35/22 Sodium 134  Radiology Studies: No results found.   Scheduled Meds: . bisacodyl  10  mg Rectal Daily  . chlorhexidine  15 mL Mouth Rinse BID  . Chlorhexidine Gluconate Cloth  6 each Topical Daily  . furosemide  80 mg Intravenous Q12H  . insulin aspart  0-6 Units Subcutaneous Q6H  . lip balm  1 application Topical BID  . mouth rinse  15 mL Mouth Rinse q12n4p  . sodium chloride flush  10-40 mL Intracatheter Q12H   Continuous Infusions: . sodium chloride 10 mL/hr at 04/28/20 0557  . amiodarone 30 mg/hr (04/28/20 0557)  . heparin    . methocarbamol (ROBAXIN) IV Stopped (04/28/20 0115)  . piperacillin-tazobactam 2.25 g (04/28/20 1209)  . TPN ADULT (ION) 55 mL/hr at 04/28/20 0557  . TPN ADULT (ION)       LOS: 14 days    Time spent: [place], MD [facility] To contact the attending provider between 7A-7P or the covering provider during after hours 7P-7A, please log into the web site [email or web address] and access using universal [place] password for that web site. If you do not have the password, please call the hospital operator.  04/28/2020, 1:23 PM

## 2020-04-28 NOTE — Progress Notes (Addendum)
Progress Note  Patient Name: Martha Ortega Date of Encounter: 04/28/2020  [facility] Cardiologist: Buford Dresser, MD   Subjective   Feeling okay this morning. Onlly drinking liquids to wet her mouth but no complaints of nausea/vomiting or abdominal pain. No complaints of chest pain, SOB, or palpitations.  Inpatient Medications    Scheduled Meds: . bisacodyl  10 mg Rectal Daily  . chlorhexidine  15 mL Mouth Rinse BID  . Chlorhexidine Gluconate Cloth  6 each Topical Daily  . furosemide  80 mg Intravenous Q12H  . heparin injection (subcutaneous)  5,000 Units Subcutaneous Q12H  . insulin aspart  0-6 Units Subcutaneous Q6H  . lip balm  1 application Topical BID  . mouth rinse  15 mL Mouth Rinse q12n4p  . sodium chloride flush  10-40 mL Intracatheter Q12H   Continuous Infusions: . sodium chloride 10 mL/hr at 04/28/20 0557  . amiodarone 30 mg/hr (04/28/20 0557)  . methocarbamol (ROBAXIN) IV Stopped (04/28/20 0115)  . piperacillin-tazobactam Stopped (04/28/20 0443)  . TPN ADULT (ION) 55 mL/hr at 04/28/20 0557   PRN Meds: acetaminophen **OR** acetaminophen, alum & mag hydroxide-simeth, diphenhydrAMINE, HYDROcodone-acetaminophen, lip balm, magic mouthwash, methocarbamol (ROBAXIN) IV, morphine injection, ondansetron **OR** ondansetron (ZOFRAN) IV, phenol, sodium chloride flush   Vital Signs    Vitals:   04/28/20 0300 04/28/20 0400 04/28/20 0500 04/28/20 0600  BP: 139/74 (!) 140/56 (!) 141/54 (!) 137/46  Pulse: 87 83 80 78  Resp: 15 10 (!) 9 11  Temp: (!) 97.4 F (36.3 C)     TempSrc: Oral     SpO2: 95% 97% 100% 100%  Weight:   71.4 kg   Height:        Intake/Output Summary (Last 24 hours) at 04/28/2020 0836 Last data filed at 04/28/2020 0600 Gross per 24 hour  Intake 2150.85 ml  Output 2145 ml  Net 5.85 ml   Last 3 Weights 04/28/2020 04/26/2020 04/24/2020  Weight (lbs) 157 lb 6.5 oz 157 lb 6.5 oz 152 lb 12.5 oz  Weight (kg) 71.4 kg 71.4 kg 69.3 kg       Telemetry    Converted to sinus rhythm around 7pm last night. - Personally Reviewed  ECG    No new tracings - Personally Reviewed  Physical Exam   GEN: No acute distress.   Neck: No JVD Cardiac: RRR, no murmurs, rubs, or gallops.  Respiratory: Clear to auscultation bilaterally. GI: Soft, nontender, non-distended  MS: 1+LE edema; No deformity. Neuro:  Nonfocal  Psych: Normal affect   Labs    High Sensitivity Troponin:   Recent Labs  Lab 04/05/2020 2234 04/23/20 0023 04/23/20 0251  TROPONINIHS 106* 128* 139*      Chemistry Recent Labs  Lab 04/25/20 0442 04/25/20 1758 04/26/20 0420 04/27/20 0642 04/28/20 0412  NA 133*  134*   < > 131* 134* 134*  K 3.9  3.9   < > 4.2 4.2 4.2  CL 95*  96*   < > 94* 94* 96*  CO2 22  24   < > 24 24 24   GLUCOSE 243*  245*   < > 244* 173* 194*  BUN 38*  40*   < > 51* 65* 75*  CREATININE 3.98*  3.82*   < > 4.15* 4.74* 4.80*  CALCIUM 7.3*  7.3*   < > 7.6* 8.4* 7.8*  PROT 4.9*  --   --  5.8* 5.1*  ALBUMIN 2.0*  2.0*  --  1.7* 2.8* 2.1*  AST 57*  --   --  45* 35  ALT 23  --   --  24 22  ALKPHOS 83  --   --  84 79  BILITOT 0.8  --   --  0.9 0.6  GFRNONAA 11*  11*   < > 10* 9* 9*  ANIONGAP 16*  14   < > 13 16* 14   < > = values in this interval not displayed.     Hematology Recent Labs  Lab 04/25/20 0442 04/26/20 0420 04/27/20 0642  WBC 22.9* 22.7* 20.2*  RBC 2.73* 2.71* 2.63*  HGB 7.9* 7.8* 7.6*  HCT 23.3* 22.6* 21.8*  MCV 85.3 83.4 82.9  MCH 28.9 28.8 28.9  MCHC 33.9 34.5 34.9  RDW 17.8* 18.3* 18.6*  PLT 294 265 178    BNPNo results for input(s): BNP, PROBNP in the last 168 hours.   DDimer No results for input(s): DDIMER in the last 168 hours.   Radiology    No results found.  Cardiac Studies   Echo 04/21/20  1. Left ventricular ejection fraction, by estimation, is 60 to 65%. The  left ventricle has normal function. The left ventricle has no regional  wall motion abnormalities. There is mild  concentric left ventricular  hypertrophy. Left ventricular diastolic  parameters are consistent with Grade I diastolic dysfunction (impaired  relaxation).  2. Right ventricular systolic function is normal. The right ventricular  size is normal. There is normal pulmonary artery systolic pressure. The  estimated right ventricular systolic pressure is 94.1 mmHg.  3. The mitral valve is normal in structure. No evidence of mitral valve  regurgitation. No evidence of mitral stenosis.  4. The aortic valve is normal in structure. Aortic valve regurgitation is  not visualized. No aortic stenosis is present.  5. The inferior vena cava is normal in size with greater than 50%  respiratory variability, suggesting right atrial pressure of 3 mmHg.   Patient Profile     86 y.o. female with PMH of CVA, CKD stage 4, hypertension, hyperlipidemia, complicated diverticulitis s/p surgery 03/31/2020 who is being followed for elevated troponins and atrial fibrillation.  Assessment & Plan    1. New onset atrial fibrillation: patient with new onset Afib in the post-op period following surgery for diverticulitis with bowel obstruction from sigmoid stricture resulting in colectomy and ileostomy. She was started on IV amiodarone with rates well controlled though remains in atrial fibrillation. She was started on CLD yesterday. CHA2DS2-VASc Score = 6 [CHF History: No, HTN History: Yes, Diabetes History: No, Stroke History: Yes, Vascular Disease History: No, Age Score: 2, Gender Score: 1].  Therefore, the patient's annual risk of stroke is 9.7 %. - Can continue IV amiodarone until clear she is tolerating PO - Favor trial of IV heparin if cleared to start anticoagulation by surgery - Anticipate transition to po anticoagulation if Hgb remains stable - in light of her AoCKD suspect she will need to start coumadin  2. Sepsis c/b hypovolemic shock in the setting of diverticulitis with bowel obstruction from sigmoid colon  with contained Qperforation: patient presented with LLQ pain and bloody stools. CT A/P with diverticulitis of the distal descending colon c/b sinus tract and 16mm collection in the low pericolic gutter. Initial medical management with conservative NG tube failed and she was ultimately taken to the OR 03/31/2020 for ex lap resulting in total colectomy and end ileostomy. Her post op course was complicated by hypotension and Afib with RVR. She initially required pressors, now off. She remains on IV antibiotics. Diet  progressed to CLD yesterday.  - Continue management per primary team and surgery  3. Elevated troponin: HsTrop peaked at 139 with low flat trend not c/w ACS. Echo this admission with EF 60-65%, no RWMA, mild concentric LVH, G1DD, and no significant valvular abnormalities. Suspect demand ischemia in the setting of sepsis/shock - Consider outpatient NST once recovered from present illness  4. AoCKD stage 4: Cr 1.8 on admission, now up to 4.8 today. Nephrology following - likely 2/2 post-op hypoperfusion/shock. She was started on IV lasix 80mg  with good UOP.  - Continue to limit nephrotoxic agents - Continue management per nephrology and primary team  5. Acute on chronic anemia: suspected to be 2/2 anemia of critical illness in the post-op state.Hgb stable at 7.6 yesterday; no labs today yet.  - Continue close monitoring to determine when anticoagulation can be started  6. HTN: BP has been creeping up. Home Hydralazine, losartan, and metoprolol succinate on hold in the setting of hypotension earlier this admission which required pressors.  - Favor restarting home metoprolol succinate for both HTN and rate control for #1 once clear she is tolerating po - Can consider restarting home hydralazine as needed   7. HLD: home crestor on hold while NPO. Now on CLD.  - Anticipate restarting crestor in the next 24-48 hours    For questions or updates, please contact [place] Please consult  [email or web address] for contact info under        Signed, Abigail Butts, PA-C  04/28/2020, 8:36 AM    Patient seen and examined and agree with Roby Lofts, PA-C as detailed above.  In brief, the patient is a 86 y.o. female with PMH of CVA, CKD stage 4, hypertension, hyperlipidemia, diverticulitis s/p ex lap, total colectomy, end ileostomy 03/27/2020 whose post-op course was c/b afib with RVR with elevated troponin for which Cardiology was consulted.  Patient feels better today. Converted back to NSR. Tolerating sips. Not taking PO medications currently.  GEN: No acute distress. Sitting up in bed. More talkative  Neck: No JVD Cardiac: RRR, no murmurs, rubs, or gallops.  Respiratory: Clear to auscultation bilaterally. GI: Soft, nontender, non-distended. Ileostomy in place MS: 1+ edema Neuro:  Nonfocal  Psych: Normal affect   Plan: -Once able to tolerate PO, will transition from amiodarone IV to beta blocker -Will start IV heparin today for anticoagulation and monitor closely--okay from surgical perspective -Goal is to be on apixaban long-term pending renal function -Nephrology following for AKI on CKD -Will manage HTN once able to tolerate PO -Resume statin when able -S/p ex lap, total colectomy, end ileostomy 03/31/2020--appreciate surgery recommendations  Gwyndolyn Kaufman, MD

## 2020-04-28 NOTE — Progress Notes (Signed)
[name], Np called about patient's blood pressure readings because they were high. Advised that patient tightens her arm muscles when sphygmomanometer inflates because she doesn't remember that it is checking her blood pressure. Also, patient recently medicated for pain. Advised by [name] to aggressively treat her pain so that her heart doesn't wear out and to avoid needing further BP lowering meds. Rechecked BP prior to giving more meds and it was WNL. Did administer more pain and anxiety meds to help ease patient's discomfort that was not yet at goal one hour after Morphine administered. Continuing to monitor her needs.

## 2020-04-28 NOTE — Progress Notes (Signed)
[place] for heparin Indication: atrial fibrillation with RVR  Allergies  Allergen Reactions  . Amlodipine Swelling    Throat and mouth swelling  . Fluticasone Propionate     REACTION: does not help  . Loratadine     REACTION: does not work  . Lovastatin     REACTION: was not effective  . Omeprazole Swelling    Throat, lips, and mouth swelling  . Zocor [Simvastatin - High Dose]     Mouth and throat swelled    Patient Measurements: Height: 5\' 2"  (157.5 cm) Weight: 71.4 kg (157 lb 6.5 oz) IBW/kg (Calculated) : 50.1 Heparin Dosing Weight: 71 kg  Vital Signs: Temp: 97.5 F (36.4 C) (01/04 0800) Temp Source: Oral (01/04 0800) BP: 124/46 (01/04 1000) Pulse Rate: 74 (01/04 1000)  Labs: Recent Labs    04/26/20 0420 04/27/20 0642 04/28/20 0412  HGB 7.8* 7.6*  --   HCT 22.6* 21.8*  --   PLT 265 178  --   CREATININE 4.15* 4.74* 4.80*    Estimated Creatinine Clearance: 8.2 mL/min (A) (by C-G formula based on SCr of 4.8 mg/dL (H)).   Assessment: Patient is an 86 y.o F with a h/o CKD IV presented to the ED on 12/21 with c/o abdominal pain and blood in stool.  Abdominal CT on 12/21 showed diverticulitis with sinus tract and collection in the low pericolic gutter.  She underwent colectomy with end ileostomy on 12/29.  Patient is in afib with RVR.  Pharmacy consulted to start heparin drip for afib.  Today, 04/28/2020: - hgb 7.6 and plts 178k on 1/3 - scr elevated and trending up (crcl<10) - last dose of hep SQ given at 9a  Goal of Therapy:  Heparin level 0.3-0.7 units/ml Monitor platelets by anticoagulation protocol: Yes   Plan:  - heparin drip at 1000 units/hr (will not bolus d/t recent surgery and anemia) - check 8 hr heparin level and cbc - monitor for s/sx bleeding   Lidie Glade P 04/28/2020,11:54 AM

## 2020-04-29 ENCOUNTER — Inpatient Hospital Stay (HOSPITAL_COMMUNITY): Payer: Medicare Other

## 2020-04-29 DIAGNOSIS — L8990 Pressure ulcer of unspecified site, unspecified stage: Secondary | ICD-10-CM | POA: Insufficient documentation

## 2020-04-29 DIAGNOSIS — K5720 Diverticulitis of large intestine with perforation and abscess without bleeding: Secondary | ICD-10-CM | POA: Diagnosis not present

## 2020-04-29 DIAGNOSIS — K5721 Diverticulitis of large intestine with perforation and abscess with bleeding: Secondary | ICD-10-CM | POA: Diagnosis not present

## 2020-04-29 DIAGNOSIS — N184 Chronic kidney disease, stage 4 (severe): Secondary | ICD-10-CM | POA: Diagnosis not present

## 2020-04-29 DIAGNOSIS — R6521 Severe sepsis with septic shock: Secondary | ICD-10-CM | POA: Diagnosis not present

## 2020-04-29 DIAGNOSIS — A419 Sepsis, unspecified organism: Secondary | ICD-10-CM | POA: Diagnosis not present

## 2020-04-29 LAB — HEPARIN LEVEL (UNFRACTIONATED)
Heparin Unfractionated: 0.14 IU/mL — ABNORMAL LOW (ref 0.30–0.70)
Heparin Unfractionated: 0.31 IU/mL (ref 0.30–0.70)

## 2020-04-29 LAB — CBC
HCT: 21.7 % — ABNORMAL LOW (ref 36.0–46.0)
Hemoglobin: 7.8 g/dL — ABNORMAL LOW (ref 12.0–15.0)
MCH: 28.8 pg (ref 26.0–34.0)
MCHC: 35.9 g/dL (ref 30.0–36.0)
MCV: 80.1 fL (ref 80.0–100.0)
Platelets: 200 10*3/uL (ref 150–400)
RBC: 2.71 MIL/uL — ABNORMAL LOW (ref 3.87–5.11)
RDW: 19.9 % — ABNORMAL HIGH (ref 11.5–15.5)
WBC: 24.2 10*3/uL — ABNORMAL HIGH (ref 4.0–10.5)
nRBC: 0.3 % — ABNORMAL HIGH (ref 0.0–0.2)

## 2020-04-29 LAB — MAGNESIUM: Magnesium: 2 mg/dL (ref 1.7–2.4)

## 2020-04-29 LAB — BASIC METABOLIC PANEL
Anion gap: 16 — ABNORMAL HIGH (ref 5–15)
BUN: 84 mg/dL — ABNORMAL HIGH (ref 8–23)
CO2: 23 mmol/L (ref 22–32)
Calcium: 8 mg/dL — ABNORMAL LOW (ref 8.9–10.3)
Chloride: 97 mmol/L — ABNORMAL LOW (ref 98–111)
Creatinine, Ser: 4.8 mg/dL — ABNORMAL HIGH (ref 0.44–1.00)
GFR, Estimated: 9 mL/min — ABNORMAL LOW (ref >60)
Glucose, Bld: 215 mg/dL — ABNORMAL HIGH (ref 70–99)
Potassium: 4.5 mmol/L (ref 3.5–5.1)
Sodium: 136 mmol/L (ref 135–145)

## 2020-04-29 LAB — PHOSPHORUS: Phosphorus: 4.1 mg/dL (ref 2.5–4.6)

## 2020-04-29 LAB — GLUCOSE, CAPILLARY
Glucose-Capillary: 194 mg/dL — ABNORMAL HIGH (ref 70–99)
Glucose-Capillary: 197 mg/dL — ABNORMAL HIGH (ref 70–99)
Glucose-Capillary: 198 mg/dL — ABNORMAL HIGH (ref 70–99)
Glucose-Capillary: 221 mg/dL — ABNORMAL HIGH (ref 70–99)

## 2020-04-29 MED ORDER — ROSUVASTATIN CALCIUM 10 MG PO TABS
10.0000 mg | ORAL_TABLET | Freq: Every day | ORAL | Status: DC
Start: 2020-04-29 — End: 2020-05-01
  Administered 2020-04-29: 10 mg via ORAL
  Filled 2020-04-29: qty 1

## 2020-04-29 MED ORDER — METOPROLOL SUCCINATE ER 25 MG PO TB24
50.0000 mg | ORAL_TABLET | Freq: Every day | ORAL | Status: DC
Start: 2020-04-29 — End: 2020-05-01
  Administered 2020-04-29: 50 mg via ORAL
  Filled 2020-04-29: qty 2

## 2020-04-29 MED ORDER — TRAVASOL 10 % IV SOLN
INTRAVENOUS | Status: DC
  Filled 2020-04-29: qty 940.8

## 2020-04-29 MED ORDER — IOHEXOL 9 MG/ML PO SOLN
ORAL | Status: AC
  Filled 2020-04-29: qty 1000

## 2020-04-29 MED ORDER — IOHEXOL 9 MG/ML PO SOLN
500.0000 mL | ORAL | Status: AC
Start: 2020-04-29 — End: 2020-04-29
  Administered 2020-04-29: 500 mL via ORAL

## 2020-04-29 MED ORDER — INSULIN ASPART 100 UNIT/ML ~~LOC~~ SOLN
0.0000 [IU] | Freq: Four times a day (QID) | SUBCUTANEOUS | Status: DC
  Administered 2020-04-29 – 2020-04-30 (×4): 2 [IU] via SUBCUTANEOUS
  Administered 2020-04-30 (×2): 3 [IU] via SUBCUTANEOUS

## 2020-04-29 NOTE — Progress Notes (Signed)
[place] for heparin Indication: atrial fibrillation with RVR  Allergies  Allergen Reactions  . Amlodipine Swelling    Throat and mouth swelling  . Fluticasone Propionate     REACTION: does not help  . Loratadine     REACTION: does not work  . Lovastatin     REACTION: was not effective  . Omeprazole Swelling    Throat, lips, and mouth swelling  . Zocor [Simvastatin - High Dose]     Mouth and throat swelled    Patient Measurements: Height: 5\' 2"  (157.5 cm) Weight: 69.6 kg (153 lb 7 oz) IBW/kg (Calculated) : 50.1 Heparin Dosing Weight: 71 kg  Vital Signs: Temp: 97.5 F (36.4 C) (01/05 0000) Temp Source: Oral (01/05 0000) BP: 118/37 (01/05 0500) Pulse Rate: 75 (01/05 0600)  Labs: Recent Labs    04/27/20 0642 04/28/20 0412 04/28/20 2029 04/29/20 0638  HGB 7.6*  --   --  7.8*  HCT 21.8*  --   --  21.7*  PLT 178  --   --  200  HEPARINUNFRC  --   --  0.18*  --   CREATININE 4.74* 4.80*  --   --     Estimated Creatinine Clearance: 8.1 mL/min (A) (by C-G formula based on SCr of 4.8 mg/dL (H)).   Assessment: Patient is an 86 y.o F with a h/o CKD IV presented to the ED on 12/21 with c/o abdominal pain and blood in stool.  Abdominal CT on 12/21 showed diverticulitis with sinus tract and collection in the low pericolic gutter.  She underwent colectomy with end ileostomy on 12/29. Heparin drip started on 1/4 for afib with RVR.  Today, 04/29/2020: - heparin level remains low at 0.14 despite rate increased to 1150 units/hr. Per pt's RN, no issues with IV line and no bleeding noted - hgb low bust stable at 7.8 and plts 200K - scr elevated but stable at 4.80 (crcl<10) - in NSR  Goal of Therapy:  Heparin level 0.3-0.7 units/ml Monitor platelets by anticoagulation protocol: Yes   Plan:  - increase heparin drip to 1350 units/hr (will not bolus d/t recent surgery and anemia) - check 8 hr heparin level - monitor for s/sx bleeding    Careen Mauch P 04/29/2020,7:07 AM

## 2020-04-29 NOTE — Progress Notes (Signed)
[place] for heparin Indication: atrial fibrillation with RVR  Allergies  Allergen Reactions  . Amlodipine Swelling    Throat and mouth swelling  . Fluticasone Propionate     REACTION: does not help  . Loratadine     REACTION: does not work  . Lovastatin     REACTION: was not effective  . Omeprazole Swelling    Throat, lips, and mouth swelling  . Zocor [Simvastatin - High Dose]     Mouth and throat swelled    Patient Measurements: Height: 5\' 2"  (157.5 cm) Weight: 69.6 kg (153 lb 7 oz) IBW/kg (Calculated) : 50.1 Heparin Dosing Weight: 71 kg  Vital Signs: Temp: 97.4 F (36.3 C) (01/05 2034) Temp Source: Axillary (01/05 2034) BP: 124/35 (01/05 1800) Pulse Rate: 76 (01/05 1700)  Labs: Recent Labs    04/27/20 1829 04/28/20 0412 04/28/20 2029 04/29/20 0638 04/29/20 0805 04/29/20 2030  HGB 7.6*  --   --  7.8*  --   --   HCT 21.8*  --   --  21.7*  --   --   PLT 178  --   --  200  --   --   HEPARINUNFRC  --   --  0.18*  --  0.14* 0.31  CREATININE 4.74* 4.80*  --  4.80*  --   --     Estimated Creatinine Clearance: 8.1 mL/min (A) (by C-G formula based on SCr of 4.8 mg/dL (H)).   Assessment: Patient is an 86 y.o F with a h/o CKD IV presented to the ED on 12/21 with c/o abdominal pain and blood in stool.  Abdominal CT on 12/21 showed diverticulitis with sinus tract and collection in the low pericolic gutter.  She underwent colectomy with end ileostomy on 12/29. Heparin drip started on 1/4 for afib with RVR.  Today, 04/29/2020: -lab unable to get heparin level at 1700 despite multiple attempts.  Heparin level drawn @ 2030 by ICU RN from the triple lumen catheter after heparin was held and line was flushed  heparin level therapeutic at 0.31 on 1350 units/hr - hgb low bust stable at 7.8 and plts 200K - scr elevated but stable at 4.80 (crcl<10) - in NSR  Goal of Therapy:  Heparin level 0.3-0.7 units/ml Monitor platelets by  anticoagulation protocol: Yes   Plan:  continue heparin drip at 1350 units/hr Daily CBC & heparin level - monitor for s/sx bleeding   Eudelia Bunch, Pharm.D 04/29/2020 9:23 PM

## 2020-04-29 NOTE — Progress Notes (Signed)
[facility] Progress Note  Subjective:   SCr stable 4.8, K 4.5, HCO3 23  2.1 L UOP yesterday, furosemide 80 BID  Pt in chair, sluggish  Vitals:   04/29/20 0600 04/29/20 0700 04/29/20 0800 04/29/20 1200  BP: (!) 127/37 (!) 162/60 (!) 118/41   Pulse: 75 76 78   Resp: 14 20 11    Temp:   98.1 F (36.7 C) 98.4 F (36.9 C)  TempSrc:   Oral Oral  SpO2: 98% 100% 100%   Weight:      Height:        Exam: Gen elderly female, in chair Chest diminished bibasilar, nl wob RRR no MRG Abd large midline wound dressed, and mid abd ostomy intact GU foley cath w/ small amts clear urine Ext diffuse sig 2+ bilat LE edema Neuro is alert, nonfocal    Home meds:  - asa 81/  crestor 20  - hydralazine 50 bid/ losartan 50 qd/ toprol xl 50 qd  - prn's/ vitamins/ supplements     Date              Creat               eGFR    2008- 2013   1.2- 1.4    2014- 2018   1.3- 1.7            36- 52, stage IIIb    2019             1.80                 35    2020             1.78                 33, stage IIIb    10/2019          2.06                 28, stage IV    04/06/20       2.38                 18, stage IV    03/28/2020       2.09                 23    04/23/20       2.54                 18    UA 12/29 - negative   UNa <10, UCr 122    CT abd pelv no contrast 12/20 - ADRENALS/URINARY TRACT: The adrenal glands are normal. No hydronephrosis, nephroureterolithiasis or solid renal mass. The urinary bladder is normal for degree of distention    CXR 12/29 - IMPRESSION: 1. Rounded gas lucencies projecting over right upper quadrant, suspicious for pneumoperitoneum. Repeat CT of the abdomen and pelvis may be useful. 2. Bibasilar consolidation, likely atelectasis. Trace right pleural effusion. 3. Enteric catheter as above.     Assessment/ Plan: 1. AKI on CKD IV - Nonoliguric. b/l creat 2.0 from July 2021,AKI likely due to post-op shock/ hypoperfusion.  No nephrotoxins noted.  UA negative  and renal US/ CT w/o obstruction. Admit creat 1.8  To 4.8 and now plateau'd but UOP picked up on lasix, likely seeing some GFR recovery.  No indication for RRT at this time. 2. Diverticulitis - sp total colectomy on 12/29 and end ileostomy 3. Shock - postop,  getting TNA, off pressors, on IV  Zosyn  4. H/o CVA 5. Anemia - per primary, transfuse prn 6. BP/ volume - BP's low to low-normal. Weights stable in past 24h to 48h, follow on lasix 7. GOC -  FC at this time  Rexene Agent  04/29/2020, 2:35 PM   Recent Labs  Lab 04/27/20 [phone] 04/28/20 0412 04/29/20 0638  K 4.2 4.2 4.5  BUN 65* 75* 84*  CREATININE 4.74* 4.80* 4.80*  CALCIUM 8.4* 7.8* 8.0*  PHOS 3.6 4.0 4.1  HGB 7.6*  --  7.8*   Inpatient medications: . bisacodyl  10 mg Rectal Daily  . chlorhexidine  15 mL Mouth Rinse BID  . Chlorhexidine Gluconate Cloth  6 each Topical Daily  . furosemide  80 mg Intravenous Q12H  . insulin aspart  0-9 Units Subcutaneous Q6H  . lip balm  1 application Topical BID  . mouth rinse  15 mL Mouth Rinse q12n4p  . metoprolol succinate  50 mg Oral Daily  . rosuvastatin  10 mg Oral Daily  . sodium chloride flush  10-40 mL Intracatheter Q12H   . sodium chloride 10 mL/hr at 04/29/20 0700  . heparin 1,350 Units/hr (04/29/20 1133)  . methocarbamol (ROBAXIN) IV Stopped (04/29/20 0430)  . piperacillin-tazobactam Stopped (04/29/20 1227)  . TPN ADULT (ION) 70 mL/hr at 04/29/20 0700  . TPN ADULT (ION)     acetaminophen **OR** acetaminophen, alum & mag hydroxide-simeth, diphenhydrAMINE, hydrALAZINE, HYDROcodone-acetaminophen, lip balm, magic mouthwash, methocarbamol (ROBAXIN) IV, morphine injection, ondansetron **OR** ondansetron (ZOFRAN) IV, phenol, sodium chloride flush

## 2020-04-29 NOTE — Progress Notes (Addendum)
Progress Note  Patient Name: Martha Ortega Date of Encounter: 04/29/2020  [facility] Cardiologist: Buford Dresser, MD   Subjective   No chest pain, seems dyspneic but no complaints.    Inpatient Medications    Scheduled Meds: . bisacodyl  10 mg Rectal Daily  . chlorhexidine  15 mL Mouth Rinse BID  . Chlorhexidine Gluconate Cloth  6 each Topical Daily  . furosemide  80 mg Intravenous Q12H  . insulin aspart  0-9 Units Subcutaneous Q6H  . lip balm  1 application Topical BID  . mouth rinse  15 mL Mouth Rinse q12n4p  . sodium chloride flush  10-40 mL Intracatheter Q12H   Continuous Infusions: . sodium chloride 10 mL/hr at 04/29/20 0700  . amiodarone 30 mg/hr (04/29/20 0700)  . heparin 1,150 Units/hr (04/29/20 0700)  . methocarbamol (ROBAXIN) IV Stopped (04/29/20 0430)  . piperacillin-tazobactam Stopped (04/29/20 0343)  . TPN ADULT (ION) 70 mL/hr at 04/29/20 0700   PRN Meds: acetaminophen **OR** acetaminophen, alum & mag hydroxide-simeth, diphenhydrAMINE, hydrALAZINE, HYDROcodone-acetaminophen, lip balm, magic mouthwash, methocarbamol (ROBAXIN) IV, morphine injection, ondansetron **OR** ondansetron (ZOFRAN) IV, phenol, sodium chloride flush   Vital Signs    Vitals:   04/29/20 0400 04/29/20 0500 04/29/20 0600 04/29/20 0700  BP: (!) 114/33 (!) 118/37 (!) 127/37 (!) 162/60  Pulse: 80 76 75 76  Resp: 12 11 14 20   Temp:      TempSrc:      SpO2: 100% 100% 98% 100%  Weight:  69.6 kg    Height:        Intake/Output Summary (Last 24 hours) at 04/29/2020 0833 Last data filed at 04/29/2020 0700 Gross per 24 hour  Intake 2686.91 ml  Output 2020 ml  Net 666.91 ml   Last 3 Weights 04/29/2020 04/28/2020 04/26/2020  Weight (lbs) 153 lb 7 oz 157 lb 6.5 oz 157 lb 6.5 oz  Weight (kg) 69.6 kg 71.4 kg 71.4 kg      Telemetry    SR maintainig since 04/28/19  - Personally Reviewed  ECG    No new - Personally Reviewed  Physical Exam   GEN: No acute distress.   Neck: +  JVD Cardiac: RRR, no murmurs, rubs, or gallops.  Respiratory: Clear to diminished to auscultation bilaterally. GI: Soft, nontender, non-distended  MS: + edema in thighs; No deformity. Neuro:  Nonfocal  Psych: Normal affect   Labs    High Sensitivity Troponin:   Recent Labs  Lab 04/09/2020 2234 04/23/20 0023 04/23/20 0251  TROPONINIHS 106* 128* 139*      Chemistry Recent Labs  Lab 04/25/20 0442 04/25/20 1758 04/26/20 0420 04/27/20 0642 04/28/20 0412 04/29/20 0638  NA 133*  134*   < > 131* 134* 134* 136  K 3.9  3.9   < > 4.2 4.2 4.2 4.5  CL 95*  96*   < > 94* 94* 96* 97*  CO2 22  24   < > 24 24 24 23   GLUCOSE 243*  245*   < > 244* 173* 194* 215*  BUN 38*  40*   < > 51* 65* 75* 84*  CREATININE 3.98*  3.82*   < > 4.15* 4.74* 4.80* 4.80*  CALCIUM 7.3*  7.3*   < > 7.6* 8.4* 7.8* 8.0*  PROT 4.9*  --   --  5.8* 5.1*  --   ALBUMIN 2.0*  2.0*  --  1.7* 2.8* 2.1*  --   AST 57*  --   --  45* 35  --  ALT 23  --   --  24 22  --   ALKPHOS 83  --   --  84 79  --   BILITOT 0.8  --   --  0.9 0.6  --   GFRNONAA 11*  11*   < > 10* 9* 9* 9*  ANIONGAP 16*  14   < > 13 16* 14 16*   < > = values in this interval not displayed.     Hematology Recent Labs  Lab 04/26/20 0420 04/27/20 0642 04/29/20 0638  WBC 22.7* 20.2* 24.2*  RBC 2.71* 2.63* 2.71*  HGB 7.8* 7.6* 7.8*  HCT 22.6* 21.8* 21.7*  MCV 83.4 82.9 80.1  MCH 28.8 28.9 28.8  MCHC 34.5 34.9 35.9  RDW 18.3* 18.6* 19.9*  PLT 265 178 200    BNPNo results for input(s): BNP, PROBNP in the last 168 hours.   DDimer No results for input(s): DDIMER in the last 168 hours.   Radiology    No results found.  Cardiac Studies   Echo 04/21/20  1. Left ventricular ejection fraction, by estimation, is 60 to 65%. The  left ventricle has normal function. The left ventricle has no regional  wall motion abnormalities. There is mild concentric left ventricular  hypertrophy. Left ventricular diastolic  parameters are  consistent with Grade I diastolic dysfunction (impaired  relaxation).  2. Right ventricular systolic function is normal. The right ventricular  size is normal. There is normal pulmonary artery systolic pressure. The  estimated right ventricular systolic pressure is 96.7 mmHg.  3. The mitral valve is normal in structure. No evidence of mitral valve  regurgitation. No evidence of mitral stenosis.  4. The aortic valve is normal in structure. Aortic valve regurgitation is  not visualized. No aortic stenosis is present.  5. The inferior vena cava is normal in size with greater than 50%  respiratory variability, suggesting right atrial pressure of 3 mmHg.  Patient Profile     86 y.o. female with PMH ofCVA, CKD stage 4, hypertension, hyperlipidemia, complicated diverticulitis s/p surgery 03/30/2020 who is being followed for elevated troponins and atrial fibrillation  Assessment & Plan    1. New onset atrial fibrillation: patient with new onset Afib in the post-op period following surgery for diverticulitis with bowel obstruction from sigmoid stricture resulting in colectomy and ileostomy. She was started on IV amiodarone with rates well controlled now in SR since 04/27/20. She was started on CLD yesterday. CHA2DS2-VASc Score = 6 [CHF History: No, HTN History: Yes, Diabetes History: No, Stroke History: Yes, Vascular Disease History: No, Age Score: 2, Gender Score: 1].  Therefore, the patient's annual risk of stroke is 9.7 %. On IV heparin - Can continue IV amiodarone until clear she is tolerating PO - Favor trial of IV heparin if cleared to start anticoagulation by surgery- cleared for DVT PPX was placed on heparin. No bolus.  - Anticipate transition to po anticoagulation if Hgb remains stable - in light of her AoCKD suspect she will need to start coumadin  2. Sepsis c/b hypovolemic shock in the setting of diverticulitis with bowel obstruction from sigmoid colon with contained Qperforation: patient  presented with LLQ pain and bloody stools. CT A/P with diverticulitis of the distal descending colon c/b sinus tract and 10mm collection in the low pericolic gutter. Initial medical management with conservative NG tube failed and she was ultimately taken to the OR 04/17/2020 for ex lap resulting in total colectomy and end ileostomy. Her post op course  was complicated by hypotension and Afib with RVR. She initially required pressors, now off. She remains on IV antibiotics. Diet progressed to CLD yesterday.  - Continue management per primary team and surgery  3. Elevated troponin: HsTrop peaked at 139 with low flat trend not c/w ACS. Echo this admission with EF 60-65%, no RWMA, mild concentric LVH, G1DD, and no significant valvular abnormalities. Suspect demand ischemia in the setting of sepsis/shock - Consider outpatient NST once recovered from present illness  4. AoCKD stage 4: Cr 1.8 on admission, now up to 4.8 yesterday and today. Nephrology following - likely 2/2 post-op hypoperfusion/shock. She was started on IV lasix 80mg  with good UOP.  Now only +5,866 down from + 6300.   Wt down from 71.4 Kg to 69.6 Kg.  - Continue to limit nephrotoxic agents - Continue management per nephrology and primary team  5. Acute on chronic anemia: suspected to be 2/2 anemia of critical illness in the post-op state.Hgb stable at 7.8 today;   - on IV heparin  6. HTN: BP has been creeping up. Home Hydralazine, losartan, and metoprolol succinate on hold in the setting of hypotension earlier this admission which required pressors.  - Favor restarting home metoprolol succinate for both HTN and rate control for #1 once clear she is tolerating po  BP labile 162/60 to 118/41.  - Can consider restarting home hydralazine as needed   7. HLD: home crestor on hold while NPO. Now on CLD.  - Anticipate restarting crestor in the next 24-48 hours       For questions or updates, please contact [place] Please consult  [email or web address] for contact info under        Signed, Cecilie Kicks, NP  04/29/2020, 8:33 AM    Patient seen and examined and agree with Cecilie Kicks, NP as detailed above.  In brief, the patient is a 86 y.o.femalewith PMH ofCVA, CKD stage 4, hypertension, hyperlipidemia, diverticulitis s/p ex lap, total colectomy, end ileostomy12/29/21 whose post-op course was c/b afib with RVR with elevated troponin for which Cardiology was consulted.  Patient continues to feel improved. Cleared by speech to advance diet. Remains in NSR. Cr stable at 4.8. UOP improving. Tolerating heparin gtt. HgB stable at 7.8  GEN: No acute distress. Sleeping in bed   Neck: No JVD Cardiac: RRR, no murmurs, rubs, or gallops.  Respiratory: Clear to auscultation bilaterally. GI: Soft, nontender, non-distended. Ileostomy in place MS: 1+ edema. Neuro:  Nonfocal  Psych: Normal affect   Plan: -Change from IV amiodarone to metop 50mg  XL -Continue IV heparin for now while awaiting renal recovery--plan to transition to apixaban long-term for [facility] -Nephrology following for AKI on CKD -Lasix per nephrology team -Will resume home rosuvastatin 20mg  daily -Likely can stop long-term ASA given need for [facility] and patient is high bleeding risk -S/p ex lap, total colectomy, end ileostomy12/29/21--appreciate surgery recommendations  Gwyndolyn Kaufman, MD

## 2020-04-29 NOTE — Progress Notes (Signed)
PHARMACY - TOTAL PARENTERAL NUTRITION CONSULT NOTE   Indication: prolonged ileus  Patient Measurements: Height: 5\' 2"  (157.5 cm) Weight: 69.6 kg (153 lb 7 oz) IBW/kg (Calculated) : 50.1 TPN AdjBW (KG): 59.9 Body mass index is 28.06 kg/m.  Assessment:  86 y/o F with a h/o CKD IV presented to the ED on 12/21 with c/o abdominal pain and blood in stool.  Abdominal CT on 12/21 showed diverticulitis with sinus tract and collection in the low pericolic gutter.  She underwent colectomy with end ileostomy on 12/29. Surgery expects protracted ileus in addition to the fact that patient has gone many days with poor appetite even prior to admission. Pharmacy consulted to initiate TPN.   Glucose / Insulin: very sensitive SSI q4h (used 4 units since TPN rate increased to 70 ml/hr at 6p on 1/4)) - cbgs goal <150: 184-221 Electrolytes: Na wnl with Na conc increased in TPN, Mag down to 2 with Mag removed from TPN for the past few days; K up 4.5, phos and CorrCa wnl - CL low at 97, CO2 wnl Renal: SCr remains at 4.80 (crcl <10), on lasix IV 80mg  q12h LFTs / TGs: LFTs wnl,  TG 69 (1/3) Prealbumin / albumin: Alb 2.8, Prealbumin <5 Intake / Output; MIVF:  UOP 0.3 ml/kg/hr; drain 50 mL, I/O +1999 - 1/1: started on lasix 80 mg IV q12h GI Imaging:  - 12/21 abd CT: Diverticulitis of the distal descending colon complicated by sinus tract and 18 mm collection in the low pericolic gutter - 40/97 abd CT: concentration of free air is in the right upper quadrant. This is favored to be post-operative. Surgeries / Procedures:  - 12/29: colectomy with end ileostomy  Central access:  Triple lumen CVC placed 12/31 TPN start date: 12/31   Nutritional Goals (per RD recommendation on 12/31): 12/31: kCal: 3532-9924 , Protein: 90-105, Fluid: 1.5L/day   Goal TPN rate 70 mL/hr (provides 94 g of protein and 1821  kcals per day)  Current Nutrition:  - TPN - 1/3: adv to clear liquid. D/c NG  Plan:  - continue TPN to goal  rate of 70 mL/hr  - Electrolytes in TPN: continue 16mEq/L of Na, reduce to 54mEq/L of K, 44mEq/L of Ca, add Mg 5 mEq/L, 25mmol/L of Phos. Cl:Ac ratio 2:1 - Add standard MVI and trace elements to TPN - add pepcid 20mg  daily to TPN bag - change to sensitive SSI q6h and adjust as needed  - Monitor TPN labs on Mon/Thurs  Lynelle Doctor PharmD 04/29/2020,7:01 AM

## 2020-04-29 NOTE — Progress Notes (Signed)
Physical Therapy Treatment Patient Details Name: Martha Ortega MRN: [id number] DOB: 09-10-1934 Today's Date: 04/29/2020    History of Present Illness Pt admitted with abdominal pain, blood in stool and sepsis 2* IB related to diverticulitis.  Pt with hx of CVA ~ 5 yrs ago with residual L UE and chest "tightness" and L foot "numbness.  Pt s/p total abdominal colectomy with end ileostomy 04/23/2020    PT Comments    Pt is lethargic today, she does arouse to verbal/tactile stimuli and can respond appropriately to questions/commands, but quickly falls back asleep. Mod assist to pivot from bed to recliner. Performed BUE/LE exercises with assistance. Decreased activity tolerance 2* lethargy today.    Follow Up Recommendations  SNF     Equipment Recommendations  None recommended by PT    Recommendations for Other Services       Precautions / Restrictions Precautions Precautions: Fall;Other (comment) Precaution Comments: New Ileostomy; JP drain on R Restrictions Weight Bearing Restrictions: No    Mobility  Bed Mobility Overal bed mobility: Needs Assistance Bed Mobility: Supine to Sit     Supine to sit: Max assist     General bed mobility comments: assist to raise trunk, advance BLEs and pivot hips to edge of bed  Transfers Overall transfer level: Needs assistance Equipment used: 1 person hand held assist Transfers: Sit to/from [employer] Transfers Sit to Stand: Mod assist Stand pivot transfers: Mod assist       General transfer comment: bear hug technique with cues for hand placmenet  Ambulation/Gait             General Gait Details: unable 2* lethargy   Stairs             Wheelchair Mobility    Modified Rankin (Stroke Patients Only)       Balance Overall balance assessment: Needs assistance Sitting-balance support: No upper extremity supported;Feet supported Sitting balance-Leahy Scale: Fair     Standing balance support: Bilateral  upper extremity supported Standing balance-Leahy Scale: Poor Standing balance comment: Reliant on external assist.                            Cognition Arousal/Alertness: Lethargic Behavior During Therapy: WFL for tasks assessed/performed Overall Cognitive Status: Within Functional Limits for tasks assessed                                 General Comments: able to follow commands and state birthdate but very lethargic with eyes closed majority of the time      Exercises General Exercises - Upper Extremity Shoulder Flexion: AAROM;Both;5 reps;Seated General Exercises - Lower Extremity Ankle Circles/Pumps: AAROM;Both;10 reps;Seated Long Arc Quad: AROM;10 reps;Both;Seated;AAROM (manual cues to stay focused on task 2* lethargy)    General Comments        Pertinent Vitals/Pain Pain Assessment: No/denies pain    Home Living                      Prior Function            PT Goals (current goals can now be found in the care plan section) Acute Rehab PT Goals Patient Stated Goal: HOME PT Goal Formulation: With patient Time For Goal Achievement: May 13, 2020 Potential to Achieve Goals: Good Progress towards PT goals: Not progressing toward goals - comment (pt very lethargic today)    Frequency  Min 2X/week      PT Plan Current plan remains appropriate    Co-evaluation              AM-PAC PT "6 Clicks" Mobility   Outcome Measure  Help needed turning from your back to your side while in a flat bed without using bedrails?: A Lot Help needed moving from lying on your back to sitting on the side of a flat bed without using bedrails?: A Lot Help needed moving to and from a bed to a chair (including a wheelchair)?: A Lot Help needed standing up from a chair using your arms (e.g., wheelchair or bedside chair)?: A Lot Help needed to walk in hospital room?: Total Help needed climbing 3-5 steps with a railing? : Total 6 Click Score:  10    End of Session Equipment Utilized During Treatment: Gait belt Activity Tolerance: Patient limited by fatigue Patient left: in chair;with call bell/phone within reach;with chair alarm set Nurse Communication: Mobility status PT Visit Diagnosis: Unsteadiness on feet (R26.81);Muscle weakness (generalized) (M62.81);Difficulty in walking, not elsewhere classified (R26.2);Pain     Time: 1211-1233 PT Time Calculation (min) (ACUTE ONLY): 22 min  Charges:  $Therapeutic Activity: 8-22 mins                     Blondell Reveal Kistler PT 04/29/2020  Acute Rehabilitation Services Pager [phone] Office [phone]

## 2020-04-29 NOTE — Progress Notes (Signed)
Progress Note  7 Days Post-Op  Subjective: Patient lethargic and confused this AM. Did not remember that she had surgery. Per RN has not been taking much PO, has been doing some gulping. SLP consulted and eval is pending.   Objective: Vital signs in last 24 hours: Temp:  [97.5 F (36.4 C)-97.6 F (36.4 C)] 97.5 F (36.4 C) (01/05 0000) Pulse Rate:  [74-91] 76 (01/05 0700) Resp:  [9-29] 20 (01/05 0700) BP: (114-204)/(33-102) 162/60 (01/05 0700) SpO2:  [87 %-100 %] 100 % (01/05 0700) FiO2 (%):  [1 %] 1 % (01/04 1100) Weight:  [69.6 kg] 69.6 kg (01/05 0500) Last BM Date: 04/29/20  Intake/Output from previous day: 01/04 0701 - 01/05 0700 In: 2686.9 [P.O.:200; I.V.:2336.9; IV Piggyback:150] Out: 2220 [Urine:2050; Drains:70; Stool:100] Intake/Output this shift: No intake/output data recorded.  PE: General: lethargic, WD, frail female who is laying in bed  Heart: regular, rate, and rhythm.  Lungs: CTAB, no wheezes, rhonchi, or rales noted.  Respiratory effort nonlabored Abd: soft, appropriately ttp, ND, +BS, ileostomy with small amount green liquid stool, midline wounds with granulation tissue present and small amount fibrinous exudate MS: edema in BL hands     Lab Results:  Recent Labs    04/27/20 0642 04/29/20 0638  WBC 20.2* 24.2*  HGB 7.6* 7.8*  HCT 21.8* 21.7*  PLT 178 200   BMET Recent Labs    04/28/20 0412 04/29/20 0638  NA 134* 136  K 4.2 4.5  CL 96* 97*  CO2 24 23  GLUCOSE 194* 215*  BUN 75* 84*  CREATININE 4.80* 4.80*  CALCIUM 7.8* 8.0*   PT/INR No results for input(s): LABPROT, INR in the last 72 hours. CMP     Component Value Date/Time   NA 136 04/29/2020 0638   K 4.5 04/29/2020 0638   CL 97 (L) 04/29/2020 0638   CO2 23 04/29/2020 0638   GLUCOSE 215 (H) 04/29/2020 0638   BUN 84 (H) 04/29/2020 0638   CREATININE 4.80 (H) 04/29/2020 0638   CALCIUM 8.0 (L) 04/29/2020 0638   PROT 5.1 (L) 04/28/2020 0412   ALBUMIN 2.1 (L) 04/28/2020 0412    AST 35 04/28/2020 0412   ALT 22 04/28/2020 0412   ALKPHOS 79 04/28/2020 0412   BILITOT 0.6 04/28/2020 0412   GFRNONAA 9 (L) 04/29/2020 0638   GFRAA 31 (L) 07/18/2016 0912   Lipase     Component Value Date/Time   LIPASE 32.0 04/06/2020 1535       Studies/Results: No results found.  Anti-infectives: Anti-infectives (From admission, onward)   Start     Dose/Rate Route Frequency Ordered Stop   04/25/20 1000  anidulafungin (ERAXIS) 100 mg in sodium chloride 0.9 % 100 mL IVPB  Status:  Discontinued        100 mg 78 mL/hr over 100 Minutes Intravenous Every 24 hours 04/24/20 0956 04/27/20 1257   04/24/20 1130  vancomycin (VANCOREADY) IVPB 1500 mg/300 mL        1,500 mg 150 mL/hr over 120 Minutes Intravenous  Once 04/24/20 1038 04/24/20 1440   04/24/20 1100  anidulafungin (ERAXIS) 200 mg in sodium chloride 0.9 % 200 mL IVPB        200 mg 78 mL/hr over 200 Minutes Intravenous  Once 04/24/20 0958 04/24/20 1515   04/24/20 1046  vancomycin variable dose per unstable renal function (pharmacist dosing)  Status:  Discontinued         Does not apply See admin instructions 04/24/20 1046 04/26/20 1041  04/02/2020 1200  piperacillin-tazobactam (ZOSYN) IVPB 2.25 g        2.25 g 100 mL/hr over 30 Minutes Intravenous Every 8 hours 04/20/2020 1138         Assessment/Plan HTN Acute blood loss anemia - hgb stable at 7.8 Acute Renal failure on Chronic kidney disease - nephrology following, Cr 4.8  Hyperlipidemia Aortic atherosclerosis Severe protein calorie malnutrition - prealbumin <5 (1/1), continue TPN Hx of CVA Septic shock - improved  Grade 1 diastolic dysfunction EF 06-30%  Paroxysmal A. fib found postoperatively - on heparin gtt and amio gtt ICU delirium   Sigmoid stricture S/p total abdominal colectomy with end ileostomy, repair of stomach injury 04/03/2020 Dr.Kinsinger - POD#7 - drain with 70 cc out in last 24 hrs, SS - ileostomy with low volume output - some fibrinous exudate  in wound base - increase dressing changes to TID, switch to dry dressings if draining a lot  - WBC remains elevated at 24 - consider repeat CT soon given low ileostomy output and leukocytosis - will discuss with MD  FEN: TPN, FLD as tolerated but SLP eval pending  VTE: heparin gtt ID: Zosyn 12/21>>  LOS: 15 days    Norm Parcel , Cvp [facility] 04/29/2020, 8:45 AM Please see Amion for pager number during day hours 7:00am-4:30pm

## 2020-04-29 NOTE — Progress Notes (Signed)
Lab informed overnight and again this morning that she needs venipuncture for heparin lab. Unable to get this accomplished for 0600 draw. Pharmacist informed.

## 2020-04-29 NOTE — Evaluation (Signed)
Clinical/Bedside Swallow Evaluation Patient Details  Name: Martha Ortega MRN: [id number] Date of Birth: 08-09-1934  Today's Date: 04/29/2020 Time: SLP Start Time (ACUTE ONLY): 0930 SLP Stop Time (ACUTE ONLY): 1005 SLP Time Calculation (min) (ACUTE ONLY): 35 min  Past Medical History:  Past Medical History:  Diagnosis Date  . Abnormal blood findings    baseline cr 1.3, watching for renal insufficiency  . Allergic rhinitis   . History of diverticulitis of colon   . Hyperglycemia 09   mild  . Hyperlipidemia   . Hypertension   . Stroke (cerebrum) [address] [facility])    Past Surgical History:  Past Surgical History:  Procedure Laterality Date  . ABDOMINAL HYSTERECTOMY    . APPENDECTOMY     Patient states not sure if this is true  . barium swallow  10/18/03  . CARDIOVASCULAR STRESS TEST  07/21/04  . COLON RESECTION N/A 04/04/2020   Procedure: TOTAL ABDOMINAL COLECTOMY WITH ILEOSTOMY REPAIR OF STOMACH INJURY;  Surgeon: Kieth Brightly, Arta Bruce, MD;  Location: [facility] ORS;  Service: General;  Laterality: N/A;  . CYSTECTOMY  93   R axilla  . CYSTECTOMY  97   R breast   HPI:  86yo female admitted 04/10/2020 with blood in stools and abdominal pain. PMH: CVA, CKD4, HTN, HLD, diverticulitis, appendectomy, hysterectomy. Pt underwent total abdominal colectomy with ileostomy repair of stomach injury 04/28/20. BSE consult received 04/29/20. Pt currently limited to clear liquids. CXR = Hazy bilateral lung base opacities, right greater than left, likely combination of pleural fluid and atelectasis. Vascular congestion.   Assessment / Plan / Recommendation Clinical Impression  Pt seen at bedside for swallow evaluation due to difficulty reported following extubation after surgery. RN reports pt is impulsive with liquids. Pt was sleeping upon arrival of SLP, but awakened easily with verbal and tactile stim. Pt was able to complete oral care with toothbrush/paste with mod assist and suction. CN exam unremarkable, although pt  is generally weak. Voice quality is clear, but low in intensity.  Following oral care, pt accepted trials of liquids, per MD/surgery PA instruction. No overt s/s aspiration was observed, and no obvious oral issues were noted. Given report of impulsivity, pt benefits from cues to limit bolus size and rate. Safe swallow precautions posted at [facility]. RN, MD, and PA informed. SLP will continue to follow to assess diet tolerance and readiness to advance textures per surgery.    SLP Visit Diagnosis: Dysphagia, unspecified (R13.10)    Aspiration Risk  Mild aspiration risk    Diet Recommendation Other (Comment) (full liquids)   Liquid Administration via: Cup;Straw Medication Administration: Other (Comment) (per surgery) Supervision: Patient able to self feed;Staff to assist with self feeding;Intermittent supervision to cue for compensatory strategies Compensations: Minimize environmental distractions;Slow rate;Small sips/bites Postural Changes: Seated upright at 90 degrees;Remain upright for at least 30 minutes after po intake    Other  Recommendations Oral Care Recommendations: Oral care BID   Follow up Recommendations None      Frequency and Duration min 1 x/week  1 week;2 weeks       Prognosis Prognosis for Safe Diet Advancement: Good      Swallow Study   General Date of Onset: 03/25/2020 HPI: 86yo female admitted 04/15/2020 with blood in stools and abdominal pain. PMH: CVA, CKD4, HTN, HLD, diverticulitis, appendectomy, hysterectomy. Pt underwent total abdominal colectomy with ileostomy repair of stomach injury 04/28/20. BSE consult received 04/29/20. Pt currently limited to clear liquids. CXR = Hazy bilateral lung base opacities, right greater than  left, likely combination of pleural fluid and atelectasis. Vascular congestion. Type of Study: Bedside Swallow Evaluation Previous Swallow Assessment: none Diet Prior to this Study: Other (Comment) (clear liquids) Temperature Spikes Noted:  No Respiratory Status: Nasal cannula History of Recent Intubation: Yes Length of Intubations (days): 1 days (during surgery) Date extubated: 04/03/2020 Behavior/Cognition: Cooperative;Alert Oral Cavity Assessment: Dry Oral Care Completed by SLP: Yes Oral Cavity - Dentition: Adequate natural dentition Vision: Functional for self-feeding Self-Feeding Abilities: Able to feed self;Needs assist Patient Positioning: Upright in bed Baseline Vocal Quality: Low vocal intensity Volitional Cough: Weak Volitional Swallow: Able to elicit    Oral/Motor/Sensory Function Overall Oral Motor/Sensory Function: Generalized oral weakness   Ice Chips Ice chips: Not tested   Thin Liquid Thin Liquid: Within functional limits Presentation: Cup;Straw;Self Fed    Puree Puree: Not tested Other Comments: pt limited to liquids only at this time   Solid     Solid: Not tested Other Comments: pt limited to liquids at this time     Capone Schwinn B. Quentin Ore, [facility], [place] Speech Language Pathologist Office: [phone] Pager: [phone]  Shonna Chock 04/29/2020,10:17 AM

## 2020-04-29 NOTE — Progress Notes (Signed)
[facility]  PROGRESS NOTE  Martha Ortega KKX:[id number] DOB: 20-Nov-1934 DOA: 03/29/2020 PCP: [name], MD   Brief HPI:   86 year old female with history of hypertension, hyperlipidemia, glaucoma developed abdominal pain she was seen in the ED with CT scan showed descending colonic diverticulitis left sinus tract small gas collection, no overt perforation.  GI was consulted initially noted that she had incomplete colonoscopy in the past in 2007, attempted colonoscopy but significant tortuosity severe narrowing with high risk of perforation, bleeding anemia follow-up showed segmental narrowing.   Patient was seen by general surgery, conservative management was started however she was unable to take p.o. and NG tube was placed.  She was taken to the OR on 04/08/2020, she became hypotensive despite IV fluid boluses. 12/13 treated with Augmentin for 2 weeks for diverticulitis 12/21 admission for diverticulitis, refractory to therapy 12/29 failed medical therapy. Total abdominal colectomy with end ileostomy. Complicated by stomach injury and left ureter entrapment, repaired intraoperatively. 12/30 transferred to ICU for hypotension,nephrology and cardiology consulted 12/31 Central venous catheter insertion, TPN initiation, started on amiodarone, Eraxis and vancomycin added to Zosyn, also started on pressors 1/1 off of pressors. 1/3 NG tube out, TPN started, Eraxis discontinued 1/4-Heparin started, therapy recommending skilled   Subjective   Patient seen and examined, continues to be pleasantly confused.  Appears comfortable.   Assessment/Plan:     1. Severe sepsis/septic shock-secondary to refractory diverticulitis with underlying sigmoid stricture.  General surgery managing, NG tube was removed on 04/27/2020.  JP drain in place.  TPN started on 04/27/2020.  Continue Zosyn. 2. AKI on CKD stage IV-entrapment of left ureter was ruled out, nephrology was consulted currently on  Lasix 80 mg IV every 12 hours.  Creatinine is stable at 4.80. 3. Paroxysmal atrial fibrillation-patient developed postop atrial fibrillation, CHA2DS2VASc score more than 6.  Norepinephrine was discontinued on 04/25/2020.  She is currently on IV albuterol use.  Continue metoprolol 50 mg p.o. daily. 4. Hyperglycemia--continue sliding scale insulin with NovoLog.  CBGs fairly well controlled.    COVID-19 Labs  No results for input(s): DDIMER, FERRITIN, LDH, CRP in the last 72 hours.  Lab Results  Component Value Date   [place] NEGATIVE 04/15/2020     Scheduled medications:   . bisacodyl  10 mg Rectal Daily  . chlorhexidine  15 mL Mouth Rinse BID  . Chlorhexidine Gluconate Cloth  6 each Topical Daily  . furosemide  80 mg Intravenous Q12H  . insulin aspart  0-9 Units Subcutaneous Q6H  . lip balm  1 application Topical BID  . mouth rinse  15 mL Mouth Rinse q12n4p  . metoprolol succinate  50 mg Oral Daily  . rosuvastatin  10 mg Oral Daily  . sodium chloride flush  10-40 mL Intracatheter Q12H         CBG: Recent Labs  Lab 04/28/20 1204 04/28/20 1803 04/28/20 2334 04/29/20 0635 04/29/20 1205  GLUCAP 196* 184* 187* 221* 197*    SpO2: 100 % O2 Flow Rate (L/min): 2 L/min FiO2 (%): (!) 1 %    CBC: Recent Labs  Lab 04/23/20 0251 04/24/20 0231 04/25/20 0442 04/26/20 0420 04/27/20 0642 04/29/20 0638  WBC 12.3* 17.9* 22.9* 22.7* 20.2* 24.2*  NEUTROABS 11.2* 16.1* 20.3* 19.7* 16.7*  --   HGB 9.6* 8.3* 7.9* 7.8* 7.6* 7.8*  HCT 28.5* 25.3* 23.3* 22.6* 21.8* 21.7*  MCV 86.1 86.6 85.3 83.4 82.9 80.1  PLT 399 330 294 265 178 169    Basic Metabolic Panel:  Recent Labs  Lab 04/25/20 0442 04/25/20 1758 04/26/20 0420 04/27/20 0642 04/28/20 0412 04/29/20 0638  NA 133*  134* 133* 131* 134* 134* 136  K 3.9  3.9 4.0 4.2 4.2 4.2 4.5  CL 95*  96* 95* 94* 94* 96* 97*  CO2 22  24 24 24 24 24 23   GLUCOSE 243*  245* 188* 244* 173* 194* 215*  BUN 38*  40* 46* 51* 65*  75* 84*  CREATININE 3.98*  3.82* 4.17* 4.15* 4.74* 4.80* 4.80*  CALCIUM 7.3*  7.3* 7.5* 7.6* 8.4* 7.8* 8.0*  MG 2.7*  --  2.8* 2.7* 2.4 2.0  PHOS 3.9  4.2  --  3.8  3.8 3.6 4.0 4.1     Liver Function Tests: Recent Labs  Lab 04/20/2020 2234 04/23/20 0251 04/24/20 0231 04/25/20 0442 04/26/20 0420 04/27/20 0642 04/28/20 0412  AST 33  --   --  57*  --  45* 35  ALT 14  --   --  23  --  24 22  ALKPHOS 30*  --   --  83  --  84 79  BILITOT 0.9  --   --  0.8  --  0.9 0.6  PROT 4.0*  --   --  4.9*  --  5.8* 5.1*  ALBUMIN 1.8*   < > 2.0* 2.0*  2.0* 1.7* 2.8* 2.1*   < > = values in this interval not displayed.     Antibiotics: Anti-infectives (From admission, onward)   Start     Dose/Rate Route Frequency Ordered Stop   04/25/20 1000  anidulafungin (ERAXIS) 100 mg in sodium chloride 0.9 % 100 mL IVPB  Status:  Discontinued        100 mg 78 mL/hr over 100 Minutes Intravenous Every 24 hours 04/24/20 0956 04/27/20 1257   04/24/20 1130  vancomycin (VANCOREADY) IVPB 1500 mg/300 mL        1,500 mg 150 mL/hr over 120 Minutes Intravenous  Once 04/24/20 1038 04/24/20 1440   04/24/20 1100  anidulafungin (ERAXIS) 200 mg in sodium chloride 0.9 % 200 mL IVPB        200 mg 78 mL/hr over 200 Minutes Intravenous  Once 04/24/20 0958 04/24/20 1515   04/24/20 1046  vancomycin variable dose per unstable renal function (pharmacist dosing)  Status:  Discontinued         Does not apply See admin instructions 04/24/20 1046 04/26/20 1041   03/25/2020 1200  piperacillin-tazobactam (ZOSYN) IVPB 2.25 g        2.25 g 100 mL/hr over 30 Minutes Intravenous Every 8 hours 03/29/2020 1138         DVT prophylaxis: Heparin  Code Status: Full code  Family Communication: No family at bedside   Consultants:  General surgery  Procedures:      Objective   Vitals:   04/29/20 0600 04/29/20 0700 04/29/20 0800 04/29/20 1200  BP: (!) 127/37 (!) 162/60 (!) 118/41   Pulse: 75 76 78   Resp: 14 20 11     Temp:   98.1 F (36.7 C) 98.4 F (36.9 C)  TempSrc:   Oral Oral  SpO2: 98% 100% 100%   Weight:      Height:        Intake/Output Summary (Last 24 hours) at 04/29/2020 1431 Last data filed at 04/29/2020 0700 Gross per 24 hour  Intake 2486.91 ml  Output 1720 ml  Net 766.91 ml    01/03 1901 - 01/05 0700 In: 3662.3 [P.O.:200; I.V.:3212.3] Out:  3810 [EUMPN:3614; Drains:110]  Filed Weights   04/26/20 0500 04/28/20 0500 04/29/20 0500  Weight: 71.4 kg 71.4 kg 69.6 kg    Physical Examination:    General: Appears in no acute distress  Cardiovascular: S1-S2, regular  Respiratory: Clear to auscultation bilaterally  Abdomen: Abdomen is soft, ostomy in place  Extremities: Trace edema in the lower extremities  Neurologic: Alert, not oriented x3, no focal deficit noted   Status is: Inpatient  Dispo: The patient is from: Home              Anticipated d/c is to: Skilled nursing facility              Anticipated d/c date is: 05/04/2020              Patient currently not medically stable for discharge  Barrier to discharge-ongoing treatment for refractory diverticulitis with underlying sigmoid stricture  Pressure Injury 04/29/20 Coccyx Medial Stage 1 -  Intact skin with non-blanchable redness of a localized area usually over a bony prominence. (Active)  04/29/20 0400  Location: Coccyx  Location Orientation: Medial  Staging: Stage 1 -  Intact skin with non-blanchable redness of a localized area usually over a bony prominence.  Wound Description (Comments):   Present on Admission: No           Data Reviewed:   Recent Results (from the past 240 hour(s))  MRSA PCR Screening     Status: None   Collection Time: 03/28/2020  7:50 PM   Specimen: Nasopharyngeal  Result Value Ref Range Status   MRSA by PCR NEGATIVE NEGATIVE Final    Comment:        The GeneXpert MRSA Assay (FDA approved for NASAL specimens only), is one component of a comprehensive MRSA  colonization surveillance program. It is not intended to diagnose MRSA infection nor to guide or monitor treatment for MRSA infections. Performed at [facility], [place] [address], [place], [place] [address]   Culture, Urine     Status: None   Collection Time: 04/15/2020  9:48 PM   Specimen: Urine, Catheterized  Result Value Ref Range Status   Specimen Description   Final    URINE, CATHETERIZED Performed at [place] [address] [place], [place] [address]    Special Requests   Final    NONE Performed at [facility], [place] [address], [place], [place] [address]    Culture   Final    NO GROWTH Performed at [facility], [place] [address] [place], [place] [address]    Report Status 04/24/2020 FINAL  Final  Culture, blood (routine x 2)     Status: None   Collection Time: 04/08/2020 10:34 PM   Specimen: BLOOD  Result Value Ref Range Status   Specimen Description   Final    BLOOD BLOOD RIGHT ARM Performed at [place] [address] [place], [place] [address]    Special Requests   Final    BOTTLES DRAWN AEROBIC AND ANAEROBIC Blood Culture adequate volume Performed at [place] [address], [place], [place] [address]    Culture   Final    NO GROWTH 5 DAYS Performed at [facility], [place] [address], [place],  [address]    Report Status 04/28/2020 FINAL  Final  Culture, blood (routine x 2)     Status: None   Collection Time: 03/26/2020 10:34 PM   Specimen: BLOOD  Result Value Ref Range Status   Specimen Description   Final    BLOOD BLOOD LEFT HAND Performed at [facility] [address], [place], [place] [address]    Special Requests   Final    BOTTLES DRAWN AEROBIC ONLY Blood Culture adequate volume Performed at [place] [address], [place], [place] [address]    Culture   Final    NO GROWTH 5  DAYS Performed at [facility], [place] [address] [place],  [address]    Report Status 04/28/2020 FINAL  Final      Oswald Hillock   [facility] If 7PM-7AM, please contact night-coverage at [email or web address], Office  [phone]   04/29/2020, 2:31 PM  LOS: 15 days

## 2020-04-30 DIAGNOSIS — R778 Other specified abnormalities of plasma proteins: Secondary | ICD-10-CM | POA: Diagnosis not present

## 2020-04-30 DIAGNOSIS — K5721 Diverticulitis of large intestine with perforation and abscess with bleeding: Secondary | ICD-10-CM | POA: Diagnosis not present

## 2020-04-30 DIAGNOSIS — I480 Paroxysmal atrial fibrillation: Secondary | ICD-10-CM | POA: Diagnosis not present

## 2020-04-30 DIAGNOSIS — K651 Peritoneal abscess: Secondary | ICD-10-CM | POA: Diagnosis not present

## 2020-04-30 LAB — BASIC METABOLIC PANEL
Anion gap: 15 (ref 5–15)
Anion gap: 15 (ref 5–15)
BUN: 97 mg/dL — ABNORMAL HIGH (ref 8–23)
BUN: 101 mg/dL — ABNORMAL HIGH (ref 8–23)
CO2: 21 mmol/L — ABNORMAL LOW (ref 22–32)
CO2: 22 mmol/L (ref 22–32)
Calcium: 8.3 mg/dL — ABNORMAL LOW (ref 8.9–10.3)
Calcium: 8.4 mg/dL — ABNORMAL LOW (ref 8.9–10.3)
Chloride: 98 mmol/L (ref 98–111)
Chloride: 98 mmol/L (ref 98–111)
Creatinine, Ser: 5.36 mg/dL — ABNORMAL HIGH (ref 0.44–1.00)
Creatinine, Ser: 5.34 mg/dL — ABNORMAL HIGH (ref 0.44–1.00)
GFR, Estimated: 7 mL/min — ABNORMAL LOW (ref >60)
GFR, Estimated: 7 mL/min — ABNORMAL LOW (ref >60)
Glucose, Bld: 306 mg/dL — ABNORMAL HIGH (ref 70–99)
Glucose, Bld: 183 mg/dL — ABNORMAL HIGH (ref 70–99)
Potassium: 5.6 mmol/L — ABNORMAL HIGH (ref 3.5–5.1)
Potassium: 5.7 mmol/L — ABNORMAL HIGH (ref 3.5–5.1)
Sodium: 134 mmol/L — ABNORMAL LOW (ref 135–145)
Sodium: 135 mmol/L (ref 135–145)

## 2020-04-30 LAB — CBC
HCT: 22.5 % — ABNORMAL LOW (ref 36.0–46.0)
Hemoglobin: 7.7 g/dL — ABNORMAL LOW (ref 12.0–15.0)
MCH: 28.6 pg (ref 26.0–34.0)
MCHC: 34.2 g/dL (ref 30.0–36.0)
MCV: 83.6 fL (ref 80.0–100.0)
Platelets: 263 10*3/uL (ref 150–400)
RBC: 2.69 MIL/uL — ABNORMAL LOW (ref 3.87–5.11)
RDW: 21.2 % — ABNORMAL HIGH (ref 11.5–15.5)
WBC: 31.5 10*3/uL — ABNORMAL HIGH (ref 4.0–10.5)
nRBC: 0.3 % — ABNORMAL HIGH (ref 0.0–0.2)

## 2020-04-30 LAB — HEPARIN LEVEL (UNFRACTIONATED): Heparin Unfractionated: 0.3 IU/mL (ref 0.30–0.70)

## 2020-04-30 LAB — MAGNESIUM: Magnesium: 2 mg/dL (ref 1.7–2.4)

## 2020-04-30 LAB — PROTIME-INR
INR: 1.8 — ABNORMAL HIGH (ref 0.8–1.2)
Prothrombin Time: 20.2 seconds — ABNORMAL HIGH (ref 11.4–15.2)

## 2020-04-30 LAB — PHOSPHORUS: Phosphorus: 6.1 mg/dL — ABNORMAL HIGH (ref 2.5–4.6)

## 2020-04-30 LAB — GLUCOSE, CAPILLARY
Glucose-Capillary: 224 mg/dL — ABNORMAL HIGH (ref 70–99)
Glucose-Capillary: 205 mg/dL — ABNORMAL HIGH (ref 70–99)
Glucose-Capillary: 171 mg/dL — ABNORMAL HIGH (ref 70–99)

## 2020-04-30 MED ORDER — MORPHINE SULFATE (PF) 2 MG/ML IV SOLN
1.0000 mg | INTRAVENOUS | Status: DC | PRN
  Administered 2020-04-30 – 2020-05-01 (×5): 1 mg via INTRAVENOUS
  Filled 2020-04-30 (×5): qty 1

## 2020-04-30 MED ORDER — GLYCOPYRROLATE 0.2 MG/ML IJ SOLN: 0.2000 mg | INTRAMUSCULAR | Status: DC | PRN

## 2020-04-30 MED ORDER — SODIUM ZIRCONIUM CYCLOSILICATE 10 G PO PACK: 10.0000 g | PACK | Freq: Two times a day (BID) | ORAL | Status: AC

## 2020-04-30 MED ORDER — DEXTROSE 10 % IV SOLN: INTRAVENOUS | Status: AC

## 2020-04-30 MED ORDER — TRACE MINERALS CU-MN-SE-ZN 300-55-60-3000 MCG/ML IV SOLN
INTRAVENOUS | Status: DC
  Filled 2020-04-30: qty 627.2

## 2020-04-30 NOTE — Progress Notes (Signed)
Progress Note  Patient Name: Martha Ortega Date of Encounter: 04/30/2020  [facility] Cardiologist: Buford Dresser, MD   Subjective  Somnolent on exam. Not answering questions and falling back asleep.  Cr worsened to 5.4, K 5.6 Declining drainage of perihepatic fluid collection Remains in NSR  Inpatient Medications    Scheduled Meds: . chlorhexidine  15 mL Mouth Rinse BID  . Chlorhexidine Gluconate Cloth  6 each Topical Daily  . insulin aspart  0-9 Units Subcutaneous Q6H  . lip balm  1 application Topical BID  . mouth rinse  15 mL Mouth Rinse q12n4p  . metoprolol succinate  50 mg Oral Daily  . rosuvastatin  10 mg Oral Daily  . sodium chloride flush  10-40 mL Intracatheter Q12H  . sodium zirconium cyclosilicate  10 g Oral BID   Continuous Infusions: . sodium chloride 10 mL/hr at 04/30/20 1016  . dextrose 70 mL/hr at 04/30/20 1016  . heparin 1,350 Units/hr (04/30/20 1016)  . methocarbamol (ROBAXIN) IV Stopped (04/29/20 0430)  . piperacillin-tazobactam Stopped (04/30/20 0756)  . TPN ADULT (ION)     PRN Meds: acetaminophen **OR** acetaminophen, alum & mag hydroxide-simeth, diphenhydrAMINE, hydrALAZINE, HYDROcodone-acetaminophen, lip balm, magic mouthwash, methocarbamol (ROBAXIN) IV, morphine injection, ondansetron **OR** ondansetron (ZOFRAN) IV, phenol, sodium chloride flush   Vital Signs    Vitals:   04/30/20 0800 04/30/20 0832 04/30/20 0900 04/30/20 1000  BP: 95/69  (!) 91/26 (!) 109/28  Pulse: 79  75 74  Resp: 20  17 14   Temp:  97.6 F (36.4 C)    TempSrc:  Axillary    SpO2: 95%  100% 94%  Weight:      Height:        Intake/Output Summary (Last 24 hours) at 04/30/2020 1246 Last data filed at 04/30/2020 1016 Gross per 24 hour  Intake 2755.08 ml  Output 390 ml  Net 2365.08 ml   Last 3 Weights 04/29/2020 04/28/2020 04/26/2020  Weight (lbs) 153 lb 7 oz 157 lb 6.5 oz 157 lb 6.5 oz  Weight (kg) 69.6 kg 71.4 kg 71.4 kg      Telemetry    NSR - Personally  Reviewed  ECG    No new tracing - Personally Reviewed  Physical Exam   GEN: Somnolent on exam; wakes up briefly and then falls back asleep. Not answering questions Neck: No JVD Cardiac: RRR, 2/6 systolic murmur at RUSB Respiratory: Clear to auscultation bilaterally on anterior lung exam GI: Soft, ileostomy in place MS: trace edema Neuro:  Nonfocal  Psych: Normal affect   Labs    High Sensitivity Troponin:   Recent Labs  Lab 04/21/2020 2234 04/23/20 0023 04/23/20 0251  TROPONINIHS 106* 128* 139*      Chemistry Recent Labs  Lab 04/25/20 0442 04/25/20 1758 04/26/20 0420 04/27/20 0642 04/28/20 0412 04/29/20 0638 04/30/20 0720  NA 133*  134*   < > 131* 134* 134* 136 134*  K 3.9  3.9   < > 4.2 4.2 4.2 4.5 5.6*  CL 95*  96*   < > 94* 94* 96* 97* 98  CO2 22  24   < > 24 24 24 23  21*  GLUCOSE 243*  245*   < > 244* 173* 194* 215* 306*  BUN 38*  40*   < > 51* 65* 75* 84* 97*  CREATININE 3.98*  3.82*   < > 4.15* 4.74* 4.80* 4.80* 5.36*  CALCIUM 7.3*  7.3*   < > 7.6* 8.4* 7.8* 8.0* 8.3*  PROT  4.9*  --   --  5.8* 5.1*  --   --   ALBUMIN 2.0*  2.0*  --  1.7* 2.8* 2.1*  --   --   AST 57*  --   --  45* 35  --   --   ALT 23  --   --  24 22  --   --   ALKPHOS 83  --   --  84 79  --   --   BILITOT 0.8  --   --  0.9 0.6  --   --   GFRNONAA 11*  11*   < > 10* 9* 9* 9* 7*  ANIONGAP 16*  14   < > 13 16* 14 16* 15   < > = values in this interval not displayed.     Hematology Recent Labs  Lab 04/27/20 1601 04/29/20 0638 04/30/20 0720  WBC 20.2* 24.2* 31.5*  RBC 2.63* 2.71* 2.69*  HGB 7.6* 7.8* 7.7*  HCT 21.8* 21.7* 22.5*  MCV 82.9 80.1 83.6  MCH 28.9 28.8 28.6  MCHC 34.9 35.9 34.2  RDW 18.6* 19.9* 21.2*  PLT 178 200 263    BNPNo results for input(s): BNP, PROBNP in the last 168 hours.   DDimer No results for input(s): DDIMER in the last 168 hours.   Radiology    CT ABDOMEN PELVIS WO CONTRAST  Result Date: 04/29/2020 CLINICAL DATA:  Decreased ileostomy  output with leukocytosis EXAM: CT ABDOMEN AND PELVIS WITHOUT CONTRAST TECHNIQUE: Multidetector CT imaging of the abdomen and pelvis was performed following the standard protocol without IV contrast. COMPARISON:  04/23/2020 FINDINGS: Lower chest: Lung bases demonstrate large right-sided pleural effusion with underlying lower lobe atelectasis. Patchy airspace opacity is noted in the upper lobes bilaterally. This may represent early infiltrate. Hepatobiliary: Liver demonstrates scattered cysts stable from the prior exam. The gallbladder remains well distended with some wall thickening likely related to underlying ascites stable from the prior exam. Pancreas: Pancreas is within normal limits. Spleen: Spleen is unremarkable. Adrenals/Urinary Tract: Adrenal glands are within normal limits. Fullness of the right renal collecting system and right ureter is noted which extends to the distal ureter increased from the prior exam. No obstructing stone is seen. Exophytic cyst is again noted in the left kidney. The bladder is well distended. Stomach/Bowel: Diverticular change of the colon is noted. Hartmann's pouch is visualized. The remainder of the colon has been surgically removed. A an ileostomy is noted in the right mid abdomen. Stomach is decompressed. No small bowel obstruction is seen. Vascular/Lymphatic: Aortic atherosclerosis. No enlarged abdominal or pelvic lymph nodes. Reproductive: Status post hysterectomy. No adnexal masses. Other: Postsurgical changes are seen with large anterior abdominal wound which remains open with packing material within. Surgical drain is noted deep within the pelvis. Some free fluid is noted in the left lateral abdomen in the expected region of the pericolic gutter as well as free fluid beneath the hemidiaphragms bilaterally. The fluid collection along the dome of the liver has increased in the interval from the prior exam and shows some air within. This may be related to the prior surgery  and residual non resort air although the possibility of a an infected postoperative fluid collection deserves consideration. This extends inferiorly along the lateral liver margin. Changes of anasarca are noted as well. Musculoskeletal: No acute or significant osseous findings. IMPRESSION: Postsurgical changes with open anterior abdominal wound and surgical drain in place. No obstructive changes are noted. Fluid collections along the dome  of the liver and lateral aspect of the liver slightly increased when compared with the prior exam with air within. Infected postoperative fluid collection deserves consideration. A second fluid collection is noted along the lateral aspect of the abdomen on the left which has increased in the interval from the prior exam and may represent infected postoperative fluid collection. Fullness of the right renal collecting system and right ureter without definitive obstructing stone. This may be related to a well distended bladder. Repeat ultrasound following bladder decompression may be helpful. Electronically Signed   By: Inez Catalina M.D.   On: 04/29/2020 15:56    Cardiac Studies   Echo 04/21/20  1. Left ventricular ejection fraction, by estimation, is 60 to 65%. The  left ventricle has normal function. The left ventricle has no regional  wall motion abnormalities. There is mild concentric left ventricular  hypertrophy. Left ventricular diastolic  parameters are consistent with Grade I diastolic dysfunction (impaired  relaxation).  2. Right ventricular systolic function is normal. The right ventricular  size is normal. There is normal pulmonary artery systolic pressure. The  estimated right ventricular systolic pressure is 81.8 mmHg.  3. The mitral valve is normal in structure. No evidence of mitral valve  regurgitation. No evidence of mitral stenosis.  4. The aortic valve is normal in structure. Aortic valve regurgitation is  not visualized. No aortic stenosis  is present.  5. The inferior vena cava is normal in size with greater than 50%  respiratory variability, suggesting right atrial pressure of 3 mmHg.  Patient Profile     86 y.o. female with PMH CVA, CKD stage 4, hypertension, hyperlipidemia, complicated diverticulitis s/p surgery 03/27/2020 who is being followed for elevated troponins and atrial fibrillation  Assessment & Plan    # New onset paroxsyxmal atrial fibrillation: patient with new onset Afib in the post-op period following surgery for diverticulitis with bowel obstruction from sigmoid stricture resulting in colectomy and ileostomy. She was started on IV amiodarone with rates well controlled now in SR since 04/27/20. She was started on CLD yesterday.CHA2DS2-VASc Score =6[CHF History: No, HTN History: Yes, Diabetes History: No, Stroke History: Yes, Vascular Disease History: No, Age Score: 2, Gender Score: 1]. Therefore, the patient's annual risk of stroke is 9.7%. Converted back to NSR on heparin for AC. - Changed from amio to metop--remains in NSR - Continue heparin gtt for now - Not improving from a renal perspective--may pursue Meg[place]el discussion  # Sepsis c/b hypovolemic shock in the setting of diverticulitis with bowel obstruction from sigmoid colon with contained Qperforation:patient presented with LLQ pain and bloody stools. CT A/P with diverticulitis of the distal descending colon c/b sinus tract and 64mm collection in the low pericolic gutter. Initial medical management with conservative NG tube failed and she was ultimately taken to the OR 04/10/2020 for ex lap resulting in total colectomy and end ileostomy. Her post op course was complicated by hypotension and Afib with RVR. She initially required pressors, now off. She remains on IV antibiotics. Diet progressed to CLD yesterday.  - Continue management per primary team and surgery  # Elevated troponin:HsTrop peaked at 139 with low flat trend not c/w ACS. Echo this admission with  EF 60-65%, no RWMA, mild concentric LVH, G1DD, and no significant valvular abnormalities. Suspect demand ischemia in the setting of sepsis/shock - Consider outpatient NST if recovers from acute illness  # AoCKD stage 4:Cr 1.8 on admission, now up to 5.4  Nephrology following - likely 2/2 post-op hypoperfusion/shock. Unfortunately,  not recovering. - Nephrology following - Continue to limit nephrotoxic agents - May need GOC discussion as not improving  # Acute on chronic anemia:suspected to be 2/2 anemia of critical illness in the post-op state.Hgb stable at 7.8 today;   - on IV heparin and tolerating  # HTN: Stable. -Continue metop as above  # HLD: -Continue home crestor  We will sign-off at this time. If patient has renal recovery, would recommend apixaban 2.5mg  BID at discharge in addition to metop. If no renal recovery, would likely need warfarin. Anticipate given lack of renal recovery, may need to discuss Dustin going forward.  For questions or updates, please contact [place] Please consult [email or web address] for contact info under        Signed, Freada Bergeron, MD  04/30/2020, 12:46 PM

## 2020-04-30 NOTE — Progress Notes (Addendum)
[facility]  PROGRESS NOTE  Martha Ortega:[id number] DOB: 1934/10/10 DOA: 04/13/2020 PCP: [name], MD   Brief HPI:   86 year old female with history of hypertension, hyperlipidemia, glaucoma developed abdominal pain she was seen in the ED with CT scan showed descending colonic diverticulitis left sinus tract small gas collection, no overt perforation.  GI was consulted initially noted that she had incomplete colonoscopy in the past in 2007, attempted colonoscopy but significant tortuosity severe narrowing with high risk of perforation, bleeding anemia follow-up showed segmental narrowing.   Patient was seen by general surgery, conservative management was started however she was unable to take p.o. and NG tube was placed.  She was taken to the OR on 04/08/2020, she became hypotensive despite IV fluid boluses. 12/13 treated with Augmentin for 2 weeks for diverticulitis 12/21 admission for diverticulitis, refractory to therapy 12/29 failed medical therapy. Total abdominal colectomy with end ileostomy. Complicated by stomach injury and left ureter entrapment, repaired intraoperatively. 12/30 transferred to ICU for hypotension,nephrology and cardiology consulted 12/31 Central venous catheter insertion, TPN initiation, started on amiodarone, Eraxis and vancomycin added to Zosyn, also started on pressors 1/1 off of pressors. 1/3 NG tube out, TPN started, Eraxis discontinued 1/4-Heparin started, therapy recommending skilled   Subjective   Patient seen and examined, lethargic this morning.  As per RN patient was earlier making comments like " I do not want to suffer", "will I make it".    Assessment/Plan:     1. Severe sepsis/septic shock-secondary to refractory diverticulitis with underlying sigmoid stricture.  General surgery managing, NG tube was removed on 04/27/2020.  JP drain in place.  TPN started on 04/27/2020.  Continue Zosyn.  WBC still elevated at 31,000. Blood  cultures x2 are negative. 2. Colonic diverticulitis with left sinus tract-patient was earlier managed conservatively however she was unable to take p.o. and NG tube was placed.  She was taken to the OR on 03/30/2020.  Underwent total colectomy with end ileostomy. 3. Intra-abdominal abscess-CT abdomen showed collection over liver, IR was consulted to drain.  However patient son has refused to give consent for drainage.  He is getting more towards comfort care rather than aggressive management at this time. 4. AKI on CKD stage IV-entrapment of left ureter was ruled out, nephrology was consulted currently on Lasix 80 mg IV every 12 hours.  Creatinine is now elevated at 5.36.  No significant improvement.  Nephrology is following 5. Hyperkalemia-potassium is elevated 5.6, Lokelma ordered per nephrology. 6. Paroxysmal atrial fibrillation-patient developed postop atrial fibrillation, CHA2DS2VASc score more than 6.  Norepinephrine was discontinued on 04/25/2020.  She is currently on IV amiodarone continue metoprolol 50 mg p.o. daily. 7. Hyperglycemia--continue sliding scale insulin with NovoLog.  CBGs fairly well controlled. 8. Goals of care-called and discussed with patient's son and discussed patient's deteriorating condition with no significant improvement.  Patient earlier told nurse that she did not want to suffer, and patient's son agrees with her.  He has already refused consent for drainage of the intra-abdominal abscess.  I have consulted palliative care to have formal goals of care discussion with patient's son.    COVID-19 Labs  No results for input(s): DDIMER, FERRITIN, LDH, CRP in the last 72 hours.  Lab Results  Component Value Date   SARSCOV2NAA NEGATIVE 04/24/2020     Scheduled medications:   . chlorhexidine  15 mL Mouth Rinse BID  . Chlorhexidine Gluconate Cloth  6 each Topical Daily  . insulin aspart  0-9 Units Subcutaneous  Q6H  . lip balm  1 application Topical BID  . mouth rinse   15 mL Mouth Rinse q12n4p  . metoprolol succinate  50 mg Oral Daily  . rosuvastatin  10 mg Oral Daily  . sodium chloride flush  10-40 mL Intracatheter Q12H  . sodium zirconium cyclosilicate  10 g Oral BID         CBG: Recent Labs  Lab 04/29/20 1205 04/29/20 1746 04/29/20 2343 04/30/20 0736 04/30/20 1202  GLUCAP 197* 194* 198* 224* 205*    SpO2: 92 % O2 Flow Rate (L/min): 2 L/min FiO2 (%): (!) 1 %    CBC: Recent Labs  Lab 04/24/20 0231 04/25/20 0442 04/26/20 0420 04/27/20 0642 04/29/20 0638 04/30/20 0720  WBC 17.9* 22.9* 22.7* 20.2* 24.2* 31.5*  NEUTROABS 16.1* 20.3* 19.7* 16.7*  --   --   HGB 8.3* 7.9* 7.8* 7.6* 7.8* 7.7*  HCT 25.3* 23.3* 22.6* 21.8* 21.7* 22.5*  MCV 86.6 85.3 83.4 82.9 80.1 83.6  PLT 330 294 265 178 200 384    Basic Metabolic Panel: Recent Labs  Lab 04/26/20 0420 04/27/20 0642 04/28/20 0412 04/29/20 0638 04/30/20 0720  NA 131* 134* 134* 136 134*  K 4.2 4.2 4.2 4.5 5.6*  CL 94* 94* 96* 97* 98  CO2 24 24 24 23  21*  GLUCOSE 244* 173* 194* 215* 306*  BUN 51* 65* 75* 84* 97*  CREATININE 4.15* 4.74* 4.80* 4.80* 5.36*  CALCIUM 7.6* 8.4* 7.8* 8.0* 8.3*  MG 2.8* 2.7* 2.4 2.0 2.0  PHOS 3.8  3.8 3.6 4.0 4.1 6.1*     Liver Function Tests: Recent Labs  Lab 04/24/20 0231 04/25/20 0442 04/26/20 0420 04/27/20 0642 04/28/20 0412  AST  --  57*  --  45* 35  ALT  --  23  --  24 22  ALKPHOS  --  83  --  84 79  BILITOT  --  0.8  --  0.9 0.6  PROT  --  4.9*  --  5.8* 5.1*  ALBUMIN 2.0* 2.0*  2.0* 1.7* 2.8* 2.1*     Antibiotics: Anti-infectives (From admission, onward)   Start     Dose/Rate Route Frequency Ordered Stop   04/25/20 1000  anidulafungin (ERAXIS) 100 mg in sodium chloride 0.9 % 100 mL IVPB  Status:  Discontinued        100 mg 78 mL/hr over 100 Minutes Intravenous Every 24 hours 04/24/20 0956 04/27/20 1257   04/24/20 1130  vancomycin (VANCOREADY) IVPB 1500 mg/300 mL        1,500 mg 150 mL/hr over 120 Minutes Intravenous   Once 04/24/20 1038 04/24/20 1440   04/24/20 1100  anidulafungin (ERAXIS) 200 mg in sodium chloride 0.9 % 200 mL IVPB        200 mg 78 mL/hr over 200 Minutes Intravenous  Once 04/24/20 0958 04/24/20 1515   04/24/20 1046  vancomycin variable dose per unstable renal function (pharmacist dosing)  Status:  Discontinued         Does not apply See admin instructions 04/24/20 1046 04/26/20 1041   04/23/2020 1200  piperacillin-tazobactam (ZOSYN) IVPB 2.25 g        2.25 g 100 mL/hr over 30 Minutes Intravenous Every 8 hours 03/25/2020 1138         DVT prophylaxis: Heparin  Code Status: Full code  Family Communication: Spoke to patient's son on phone.   Consultants:  General surgery  Procedures:      Objective   Vitals:   04/30/20  0900 04/30/20 1000 04/30/20 1100 04/30/20 1200  BP: (!) 91/26 (!) 109/28 (!) 106/31 (!) 108/43  Pulse: 75 74 74 76  Resp: 17 14 15 16   Temp:    (!) 97.5 F (36.4 C)  TempSrc:    Axillary  SpO2: 100% 94% 99% 92%  Weight:      Height:        Intake/Output Summary (Last 24 hours) at 04/30/2020 1353 Last data filed at 04/30/2020 1016 Gross per 24 hour  Intake 2755.08 ml  Output 390 ml  Net 2365.08 ml    01/04 1901 - 01/06 0700 In: 3546 [P.O.:250; I.V.:3597] Out: 2030 [Urine:1850; Drains:70]  Filed Weights   04/26/20 0500 04/28/20 0500 04/29/20 0500  Weight: 71.4 kg 71.4 kg 69.6 kg    Physical Examination:    General-appears lethargic  Heart-S1-S2, regular, no murmur auscultated  Lungs-clear to auscultation bilaterally, no wheezing or crackles auscultated  Abdomen-soft, nontender, no organomegaly  Extremities-no edema in the lower extremities  Neuro-alert, not oriented x3, no focal deficit noted   Status is: Inpatient  Dispo: The patient is from: Home              Anticipated d/c is to: Skilled nursing facility              Anticipated d/c date is: 05/04/2020              Patient currently not medically stable for  discharge  Barrier to discharge-ongoing treatment for refractory diverticulitis with underlying sigmoid stricture  Pressure Injury 04/29/20 Coccyx Medial Stage 1 -  Intact skin with non-blanchable redness of a localized area usually over a bony prominence. (Active)  04/29/20 0400  Location: Coccyx  Location Orientation: Medial  Staging: Stage 1 -  Intact skin with non-blanchable redness of a localized area usually over a bony prominence.  Wound Description (Comments):   Present on Admission: No           Data Reviewed:   Recent Results (from the past 240 hour(s))  MRSA PCR Screening     Status: None   Collection Time: 04/20/2020  7:50 PM   Specimen: Nasopharyngeal  Result Value Ref Range Status   MRSA by PCR NEGATIVE NEGATIVE Final    Comment:        The GeneXpert MRSA Assay (FDA approved for NASAL specimens only), is one component of a comprehensive MRSA colonization surveillance program. It is not intended to diagnose MRSA infection nor to guide or monitor treatment for MRSA infections. Performed at [facility], [place] [address], [place], [place] [address]   Culture, Urine     Status: None   Collection Time: 04/19/2020  9:48 PM   Specimen: Urine, Catheterized  Result Value Ref Range Status   Specimen Description   Final    URINE, CATHETERIZED Performed at [facility] [address], [place], [place] [address]    Special Requests   Final    NONE Performed at [facility], [place] [address], [place], [place] [address]    Culture   Final    NO GROWTH Performed at [facility], [place] [address], [place], [place] [address]    Report Status 04/24/2020 FINAL  Final  Culture, blood (routine x 2)     Status: None   Collection Time: 03/26/2020 10:34 PM   Specimen: BLOOD  Result Value Ref Range Status   Specimen Description   Final    BLOOD BLOOD RIGHT ARM  Performed at [facility], [place]  [address], [place], [place] [address]    Special Requests   Final    BOTTLES DRAWN AEROBIC AND ANAEROBIC Blood Culture adequate volume Performed at [facility] [address] [place], [place] [address]    Culture   Final    NO GROWTH 5 DAYS Performed at [facility], [place] [address] [place], [place] [address]    Report Status 04/28/2020 FINAL  Final  Culture, blood (routine x 2)     Status: None   Collection Time: 03/27/2020 10:34 PM   Specimen: BLOOD  Result Value Ref Range Status   Specimen Description   Final    BLOOD BLOOD LEFT HAND Performed at [place] [address], [place], [place] [address]    Special Requests   Final    BOTTLES DRAWN AEROBIC ONLY Blood Culture adequate volume Performed at [place] [address], [place], [place] [address]    Culture   Final    NO GROWTH 5 DAYS Performed at [facility], [place] [address] [place],  [address]    Report Status 04/28/2020 FINAL  Final      Martha Ortega   [facility] If 7PM-7AM, please contact night-coverage at [email or web address], Office  [phone]   04/30/2020, 1:53 PM  LOS: 16 days

## 2020-04-30 NOTE — Progress Notes (Signed)
8 Days Post-Op   Subjective/Chief Complaint: Resting comfortably. No complaints   Objective: Vital signs in last 24 hours: Temp:  [97.4 F (36.3 C)-98.9 F (37.2 C)] 97.6 F (36.4 C) (01/06 0832) Pulse Rate:  [75-87] 75 (01/06 0900) Resp:  [11-32] 17 (01/06 0900) BP: (91-164)/(26-105) 91/26 (01/06 0900) SpO2:  [89 %-100 %] 100 % (01/06 0900) Last BM Date: 04/29/20  Intake/Output from previous day: 01/05 0701 - 01/06 0700 In: 2413.9 [P.O.:250; I.V.:2113.9; IV Piggyback:50] Out: 390 [Urine:300; Drains:30; Stool:60] Intake/Output this shift: Total I/O In: 458.7 [I.V.:358.7; IV Piggyback:100] Out: -   General appearance: alert and cooperative Resp: rales LUL Cardio: irregularly irregular rhythm GI: soft, mild tenderness. ostomy productive  Lab Results:  Recent Labs    04/29/20 0638 04/30/20 0720  WBC 24.2* 31.5*  HGB 7.8* 7.7*  HCT 21.7* 22.5*  PLT 200 263   BMET Recent Labs    04/29/20 0638 04/30/20 0720  NA 136 134*  K 4.5 5.6*  CL 97* 98  CO2 23 21*  GLUCOSE 215* 306*  BUN 84* 97*  CREATININE 4.80* 5.36*  CALCIUM 8.0* 8.3*   PT/INR Recent Labs    04/30/20 0720  LABPROT 20.2*  INR 1.8*   ABG No results for input(s): PHART, HCO3 in the last 72 hours.  Invalid input(s): PCO2, PO2  Studies/Results: CT ABDOMEN PELVIS WO CONTRAST  Result Date: 04/29/2020 CLINICAL DATA:  Decreased ileostomy output with leukocytosis EXAM: CT ABDOMEN AND PELVIS WITHOUT CONTRAST TECHNIQUE: Multidetector CT imaging of the abdomen and pelvis was performed following the standard protocol without IV contrast. COMPARISON:  04/23/2020 FINDINGS: Lower chest: Lung bases demonstrate large right-sided pleural effusion with underlying lower lobe atelectasis. Patchy airspace opacity is noted in the upper lobes bilaterally. This may represent early infiltrate. Hepatobiliary: Liver demonstrates scattered cysts stable from the prior exam. The gallbladder remains well distended with some  wall thickening likely related to underlying ascites stable from the prior exam. Pancreas: Pancreas is within normal limits. Spleen: Spleen is unremarkable. Adrenals/Urinary Tract: Adrenal glands are within normal limits. Fullness of the right renal collecting system and right ureter is noted which extends to the distal ureter increased from the prior exam. No obstructing stone is seen. Exophytic cyst is again noted in the left kidney. The bladder is well distended. Stomach/Bowel: Diverticular change of the colon is noted. Hartmann's pouch is visualized. The remainder of the colon has been surgically removed. A an ileostomy is noted in the right mid abdomen. Stomach is decompressed. No small bowel obstruction is seen. Vascular/Lymphatic: Aortic atherosclerosis. No enlarged abdominal or pelvic lymph nodes. Reproductive: Status post hysterectomy. No adnexal masses. Other: Postsurgical changes are seen with large anterior abdominal wound which remains open with packing material within. Surgical drain is noted deep within the pelvis. Some free fluid is noted in the left lateral abdomen in the expected region of the pericolic gutter as well as free fluid beneath the hemidiaphragms bilaterally. The fluid collection along the dome of the liver has increased in the interval from the prior exam and shows some air within. This may be related to the prior surgery and residual non resort air although the possibility of a an infected postoperative fluid collection deserves consideration. This extends inferiorly along the lateral liver margin. Changes of anasarca are noted as well. Musculoskeletal: No acute or significant osseous findings. IMPRESSION: Postsurgical changes with open anterior abdominal wound and surgical drain in place. No obstructive changes are noted. Fluid collections along the dome of the liver and  lateral aspect of the liver slightly increased when compared with the prior exam with air within. Infected  postoperative fluid collection deserves consideration. A second fluid collection is noted along the lateral aspect of the abdomen on the left which has increased in the interval from the prior exam and may represent infected postoperative fluid collection. Fullness of the right renal collecting system and right ureter without definitive obstructing stone. This may be related to a well distended bladder. Repeat ultrasound following bladder decompression may be helpful. Electronically Signed   By: Inez Catalina M.D.   On: 04/29/2020 15:56    Anti-infectives: Anti-infectives (From admission, onward)   Start     Dose/Rate Route Frequency Ordered Stop   04/25/20 1000  anidulafungin (ERAXIS) 100 mg in sodium chloride 0.9 % 100 mL IVPB  Status:  Discontinued        100 mg 78 mL/hr over 100 Minutes Intravenous Every 24 hours 04/24/20 0956 04/27/20 1257   04/24/20 1130  vancomycin (VANCOREADY) IVPB 1500 mg/300 mL        1,500 mg 150 mL/hr over 120 Minutes Intravenous  Once 04/24/20 1038 04/24/20 1440   04/24/20 1100  anidulafungin (ERAXIS) 200 mg in sodium chloride 0.9 % 200 mL IVPB        200 mg 78 mL/hr over 200 Minutes Intravenous  Once 04/24/20 0958 04/24/20 1515   04/24/20 1046  vancomycin variable dose per unstable renal function (pharmacist dosing)  Status:  Discontinued         Does not apply See admin instructions 04/24/20 1046 04/26/20 1041   04/20/2020 1200  piperacillin-tazobactam (ZOSYN) IVPB 2.25 g        2.25 g 100 mL/hr over 30 Minutes Intravenous Every 8 hours 04/13/2020 1138        Assessment/Plan: s/p Procedure(s): TOTAL ABDOMINAL COLECTOMY WITH ILEOSTOMY REPAIR OF STOMACH INJURY (N/A) full liquids  afib with rvr. On heparin. Rate controlled this am HTN Acute blood loss anemia - hgb stable at 7.8 Acute Renal failure on Chronic kidney disease - nephrology following, Cr 4.8  Hyperlipidemia Aortic atherosclerosis Severe protein calorie malnutrition - prealbumin <5 (1/1), continue  TPN Hx of CVA Septic shock - improved  Grade 1 diastolic dysfunction EF 31-59%  Paroxysmal A. fib found postoperatively - on heparin gtt and amio gtt ICU delirium   Sigmoid stricture S/p total abdominal colectomy with end ileostomy, repair of stomach injury 04/17/2020 Dr.Kinsinger - POD#7 - drain with 70 cc out in last 24 hrs, SS - ileostomy with low volume output - some fibrinous exudate in wound base - increase dressing changes to TID, switch to dry dressings if draining a lot  - WBC remains elevated at 31. I also suspect pneumonia. Has been on zosyn. May need to consider changing coverage - CT showed fluid collection over liver. Will ask IR to drain  FEN: TPN, FLD as tolerated but SLP eval pending  VTE: heparin gtt ID: Zosyn 12/21>>  LOS: 16 days    Autumn Messing III 04/30/2020

## 2020-04-30 NOTE — Progress Notes (Signed)
Notified Dr. Darrick Meigs that patient was continuing to decline and require more oxygen. Clarified goals and escalation of care. MD notified me that he and son had agreed to move forward with full comfort measures. No further escalation of care. MD wants to hold all meds that do not correspond with comfort. Awaiting further orders for comfort measures.  Discussed with son that pt condition was rapidly declining. Son verbalized understanding and stated he "just wanted his mom comfortable"

## 2020-04-30 NOTE — Progress Notes (Signed)
[facility] Progress Note  Subjective:   SCr worsened K 5.6  UOP not fully quantified using purewick but s eems has dropped off  Pt sluggish/sleeping in bed  CT Abd yest, distended bladder  Vitals:   04/30/20 0700 04/30/20 0800 04/30/20 0832 04/30/20 0900  BP: (!) 97/29 95/69  (!) 91/26  Pulse: 78 79  75  Resp: 17 20  17   Temp:   97.6 F (36.4 C)   TempSrc:   Axillary   SpO2: 96% 95%  100%  Weight:      Height:        Exam: Gen elderly female, in bed Chest diminished bibasilar, nl wob RRR no MRG Abd large midline wound dressed, and mid abd ostomy intact GU foley cath w/ small amts clear urine Ext diffuse sig 2+ bilat LE edema Neuro is alert, nonfocal    Home meds:  - asa 81/  crestor 20  - hydralazine 50 bid/ losartan 50 qd/ toprol xl 50 qd  - prn's/ vitamins/ supplements     Date              Creat               eGFR    2008- 2013   1.2- 1.4    2014- 2018   1.3- 1.7            36- 52, stage IIIb    2019             1.80                 35    2020             1.78                 33, stage IIIb    10/2019          2.06                 28, stage IV    04/06/20       2.38                 18, stage IV    04/08/2020       2.09                 23    04/23/20       2.54                 18    UA 12/29 - negative   UNa <10, UCr 122    CT abd pelv no contrast 12/20 - ADRENALS/URINARY TRACT: The adrenal glands are normal. No hydronephrosis, nephroureterolithiasis or solid renal mass. The urinary bladder is normal for degree of distention    CXR 12/29 - IMPRESSION: 1. Rounded gas lucencies projecting over right upper quadrant, suspicious for pneumoperitoneum. Repeat CT of the abdomen and pelvis may be useful. 2. Bibasilar consolidation, likely atelectasis. Trace right pleural effusion. 3. Enteric catheter as above.     Assessment/ Plan: 1. AKI on CKD IV - Nonoliguric. b/l creat 2.0 from July 2021,AKI likely due to post-op shock/ hypoperfusion.  No  nephrotoxins noted.  UA negative. Admit creat 1.8  To 5.4. CT Abd suggestign not urinating well, ? If explains. Could also be intravascular dry with ongoing furosemide.   2. Diverticulitis - sp total colectomy on 12/29 and end ileostomy 3. Shock - postop, getting TNA, off pressors, on  IV  Zosyn  4. H/o CVA 5. Anemia - per primary, transfuse prn 6. BP/ volume - BP's low to low-normal. Weights stable in past 24h to 48h, follow on lasix 7. GOC -  FC at this time 8. Risking leukocytosis   Stop lasix  Lokelma 10gm BID today  I&O cath pt to see how mcuh urine  Not bouncing back, need to explore Gen[facility]va Ill  04/30/2020, 9:51 AM   Recent Labs  Lab 04/29/20 0638 04/30/20 0720  K 4.5 5.6*  BUN 84* 97*  CREATININE 4.80* 5.36*  CALCIUM 8.0* 8.3*  PHOS 4.1 6.1*  HGB 7.8* 7.7*   Inpatient medications: . bisacodyl  10 mg Rectal Daily  . chlorhexidine  15 mL Mouth Rinse BID  . Chlorhexidine Gluconate Cloth  6 each Topical Daily  . insulin aspart  0-9 Units Subcutaneous Q6H  . lip balm  1 application Topical BID  . mouth rinse  15 mL Mouth Rinse q12n4p  . metoprolol succinate  50 mg Oral Daily  . rosuvastatin  10 mg Oral Daily  . sodium chloride flush  10-40 mL Intracatheter Q12H  . sodium zirconium cyclosilicate  10 g Oral BID   . sodium chloride 10 mL/hr at 04/30/20 0845  . dextrose    . heparin 1,350 Units/hr (04/30/20 0845)  . methocarbamol (ROBAXIN) IV Stopped (04/29/20 0430)  . piperacillin-tazobactam Stopped (04/30/20 0756)   acetaminophen **OR** acetaminophen, alum & mag hydroxide-simeth, diphenhydrAMINE, hydrALAZINE, HYDROcodone-acetaminophen, lip balm, magic mouthwash, methocarbamol (ROBAXIN) IV, morphine injection, ondansetron **OR** ondansetron (ZOFRAN) IV, phenol, sodium chloride flush

## 2020-04-30 NOTE — Progress Notes (Signed)
Patient ID: Martha Ortega, female   DOB: [birth year], 86 y.o.   MRN: [id number] Request received for drainage of perihepatic fluid collection in pt. Imaging studies were reviewed by Dr. Serafina Royals.  Following discussion of procedure with patient's son, Martha Ortega,  he stated that "patient has been through enough already" and does not want procedure done.  TRH and nurse updated.  Contact IR at [phone] or [phone] if any changes occur.

## 2020-04-30 NOTE — Progress Notes (Signed)
Made Dr. Darrick Meigs aware that pt was having increased oxygen needs and BP was trending down. MD contacted family to discuss plan of care

## 2020-04-30 NOTE — Progress Notes (Addendum)
Called and discussed with patient's son Coy Saunas, discussed CODE STATUS including CPR, mechanical ventilation, defibrillation.  Patient has been declining clinically, earlier patient son refused drain placement by IR as he is leaning more towards comfort measures only.    Patient's son agrees with making her DNR.

## 2020-04-30 NOTE — Progress Notes (Signed)
PHARMACY - TOTAL PARENTERAL NUTRITION CONSULT NOTE   Indication: prolonged ileus  Patient Measurements: Height: 5\' 2"  (157.5 cm) Weight: 69.6 kg (153 lb 7 oz) IBW/kg (Calculated) : 50.1 TPN AdjBW (KG): 59.9 Body mass index is 28.06 kg/m.  Assessment:  86 y/o F with a h/o CKD IV presented to the ED on 12/21 with c/o abdominal pain and blood in stool.  Abdominal CT on 12/21 showed diverticulitis with sinus tract and collection in the low pericolic gutter.  She underwent colectomy with end ileostomy on 12/29. Surgery expects protracted ileus in addition to the fact that patient has gone many days with poor appetite even prior to admission. Pharmacy consulted to initiate TPN.   Glucose / Insulin: very sensitive SSI q4h (used 7 units in past 24 hours)) - cbgs goal <150: 194-224 Electrolytes: Na 134, Mag stable at  2  After magnesium added back to TPN; K+ up 5.6, elevated,  phos 6.1 ,elevated  and CorrCa wnl - Cl  98, CO2 slightly low at 21  Renal: SCr inc to 5.36 (crcl <10), Lasix discontinued this AM  LFTs / TGs: LFTs wnl,  TG 69 (1/3) Prealbumin / albumin: Alb 2.8, Prealbumin <5 Intake / Output; MIVF:  UOP 0.3 ml/kg/hr; drain 50 mL, I/O +2023 - 1/1: started on lasix 80 mg IV q12h - 1/6 Lasix stopped   GI Imaging:  - 12/21 abd CT: Diverticulitis of the distal descending colon complicated by sinus tract and 18 mm collection in the low pericolic gutter - 26/71 abd CT: concentration of free air is in the right upper quadrant. This is favored to be post-operative. Surgeries / Procedures:  - 12/29: colectomy with end ileostomy  Central access:  Triple lumen CVC placed 12/31 TPN start date: 12/31   Nutritional Goals (per RD recommendation on 12/31): 12/31: kCal: 2458-0998 , Protein: 90-105, Fluid: 1.5L/day   Goal TPN rate 70 mL/hr (provides 94 g of protein and 1821  kcals per day)  Current Nutrition:  - TPN - 1/3: adv to clear liquid. D/c NG  Today - Messaged Barkley Boards PA - Hold  TPN for rest of today due to elevated K+, phosphorus levels. Will order D10 to infuse until 1800   Plan:  - continue TPN to goal rate of 70 mL/hr  - Electrolytes in TPN: continue 92mEq/L of Na, remove K+ from TPN, continue 15mEq/L of Ca, Mg 5 mEq/L, Remove phosphorus from TPN. Cl:Ac ratio 1:1 - standard MVI and trace elements to TPN - Pepcid 20mg  daily to TPN bag - continue sensitive SSI q6h and adjust as needed  - BMP, magnesium, phosphorus with AM labs  - Monitor TPN labs on Mon/Thurs    Royetta Asal, PharmD, BCPS 04/30/2020 11:28 AM

## 2020-04-30 NOTE — Progress Notes (Signed)
[facility] for heparin Indication: atrial fibrillation with RVR  Allergies  Allergen Reactions  . Amlodipine Swelling    Throat and mouth swelling  . Fluticasone Propionate     REACTION: does not help  . Loratadine     REACTION: does not work  . Lovastatin     REACTION: was not effective  . Omeprazole Swelling    Throat, lips, and mouth swelling  . Zocor [Simvastatin - High Dose]     Mouth and throat swelled    Patient Measurements: Height: 5\' 2"  (157.5 cm) Weight: 69.6 kg (153 lb 7 oz) IBW/kg (Calculated) : 50.1 Heparin Dosing Weight: 71 kg  Vital Signs: Temp: 97.6 F (36.4 C) (01/06 0832) Temp Source: Axillary (01/06 0832) BP: 95/69 (01/06 0800) Pulse Rate: 79 (01/06 0800)  Labs: Recent Labs    04/28/20 0412 04/28/20 2029 04/29/20 4496 04/29/20 0805 04/29/20 2030 04/30/20 0720  HGB  --   --  7.8*  --   --  7.7*  HCT  --   --  21.7*  --   --  22.5*  PLT  --   --  200  --   --  263  LABPROT  --   --   --   --   --  20.2*  INR  --   --   --   --   --  1.8*  HEPARINUNFRC  --    < >  --  0.14* 0.31 0.30  CREATININE 4.80*  --  4.80*  --   --  5.36*   < > = values in this interval not displayed.    Estimated Creatinine Clearance: 7.3 mL/min (A) (by C-G formula based on SCr of 5.36 mg/dL (H)).   Assessment: Patient is an 86 y.o F with a h/o CKD IV presented to the ED on 12/21 with c/o abdominal pain and blood in stool.  Abdominal CT on 12/21 showed diverticulitis with sinus tract and collection in the low pericolic gutter.  She underwent colectomy with end ileostomy on 12/29. Heparin drip started on 1/4 for afib with RVR.  Today, 04/30/2020:  - heparin level therapeutic at 0.3 on 1350 units/hr - hgb low bust stable at 7.7 and plts 263K - scr elevated  And increased to  5.36 (crcl<10)    Goal of Therapy:  Heparin level 0.3-0.7 units/ml Monitor platelets by anticoagulation protocol: Yes   Plan:   Continue heparin drip  at 1350 units/hr  Daily CBC & heparin level  Monitor for s/sx bleeding    Royetta Asal, PharmD, BCPS 04/30/2020 11:26 AM

## 2020-05-01 DIAGNOSIS — R531 Weakness: Secondary | ICD-10-CM | POA: Diagnosis not present

## 2020-05-01 DIAGNOSIS — Z515 Encounter for palliative care: Secondary | ICD-10-CM

## 2020-05-01 DIAGNOSIS — Z7189 Other specified counseling: Secondary | ICD-10-CM

## 2020-05-01 DIAGNOSIS — R6521 Severe sepsis with septic shock: Secondary | ICD-10-CM | POA: Diagnosis not present

## 2020-05-01 DIAGNOSIS — A419 Sepsis, unspecified organism: Secondary | ICD-10-CM | POA: Diagnosis not present

## 2020-05-01 MED ORDER — HALOPERIDOL LACTATE 2 MG/ML PO CONC
0.5000 mg | ORAL | Status: DC | PRN
  Filled 2020-05-01: qty 0.3

## 2020-05-01 MED ORDER — GLYCOPYRROLATE 0.2 MG/ML IJ SOLN: 0.2000 mg | INTRAMUSCULAR | Status: DC | PRN

## 2020-05-01 MED ORDER — BIOTENE DRY MOUTH MT LIQD: 15.0000 mL | OROMUCOSAL | Status: DC | PRN

## 2020-05-01 MED ORDER — SODIUM CHLORIDE 0.9 % IV SOLN: INTRAVENOUS | Status: DC

## 2020-05-01 MED ORDER — HALOPERIDOL 1 MG PO TABS: 0.5000 mg | ORAL_TABLET | ORAL | Status: DC | PRN

## 2020-05-01 MED ORDER — SODIUM CHLORIDE 0.9 % IV SOLN
0.5000 mg/h | INTRAVENOUS | Status: DC
  Administered 2020-05-01: 0.5 mg/h via INTRAVENOUS
  Filled 2020-05-01: qty 2.5

## 2020-05-01 MED ORDER — HALOPERIDOL LACTATE 5 MG/ML IJ SOLN: 0.5000 mg | INTRAMUSCULAR | Status: DC | PRN

## 2020-05-01 MED ORDER — GLYCOPYRROLATE 1 MG PO TABS: 1.0000 mg | ORAL_TABLET | ORAL | Status: DC | PRN

## 2020-05-01 MED ORDER — MORPHINE SULFATE (PF) 2 MG/ML IV SOLN: 1.0000 mg | INTRAVENOUS | Status: DC | PRN

## 2020-05-01 MED ORDER — POLYVINYL ALCOHOL 1.4 % OP SOLN
1.0000 [drp] | Freq: Four times a day (QID) | OPHTHALMIC | Status: DC | PRN
  Filled 2020-05-01: qty 15

## 2020-05-01 MED ORDER — HYDROMORPHONE BOLUS VIA INFUSION
1.0000 mg | INTRAVENOUS | Status: DC | PRN
  Administered 2020-05-01 (×2): 1 mg via INTRAVENOUS
  Filled 2020-05-01: qty 1

## 2020-05-26 NOTE — Consult Note (Signed)
Consultation Note Date: May 13, 2020   Patient Name: Martha Ortega  DOB: 06/28/1934  MRN: [id number]  Age / Sex: 86 y.o., female  PCP: Tower, Wynelle Fanny, MD Referring Physician: Oswald Hillock, MD  Reason for Consultation: Establishing goals of care  HPI/Patient Profile: 86 y.o. female  admitted on 04/05/2020    Clinical Assessment and Goals of Care: 86 year old lady with hypertension dyslipidemia glaucoma admitted with abdominal pain CT scan showing colonic diverticulitis left sinus tract small gas collection.  GI was consulted, also seen by general surgery.  Patient initially with conservative management however was taken to operating room 04/20/2020, patient became hypotensive.  Patient underwent total abdominal colectomy with end ileostomy complicated by stomach injury left ureter entrapment that was repaired intraoperatively.  Patient remained in the ICU for hypotension and also briefly required pressors.  Patient remains admitted to hospital medicine service for severe sepsis/septic shock secondary to refractory diverticulitis with underlying sigmoid stricture.  Repeat abdominal imaging showing intra-abdominal abscess-CT scan of the abdomen showing collection over the liver.  Course also complicated by acute kidney injury on top of chronic kidney disease.  Patient reportedly started saying I do not want to suffer, patient's son has had multiple goals of care discussions and ultimately the plan was made to switch to full comfort focused care.  CODE STATUS was revised to DO NOT RESUSCITATE, comfort measures were initiated.  Palliative consultation for ongoing establishment of comfort measures and terminal care has been requested.  Patient is resting in bed.  She is not awake or alert.  When she does open her eyes she appears to be moaning.  She has mild nonverbal gestures of distress/discomfort evident: She does  have some facial grimacing and is also moaning.  Discussed with bedside RN, patient seen and examined, chart reviewed.  Call placed and discussed with patient's son Mr. Montgomery.  He is tearful however he is in full understanding of the serious nature of the patient's condition and states that full scope of comfort measures is best representative of the patient's wishes at this time.  He is aware of markedly limited prognosis of hours to days, anticipated hospital death.  He was quite tearful over the phone.  Offered active listening and supportive care empathic presence as much as is possible over the telephone.    HCPOA Son  SUMMARY OF RECOMMENDATIONS   Agree with DO NOT RESUSCITATE Agree with comfort measures only Will opioid rotate from morphine to Dilaudid because of patient's renal failure, will add low-dose Dilaudid infusion and also have provision for boluses based on patient's need.  We will also add some other components of end-of-life order set.  Will request chaplain support. Prognosis Hours-days Anticipated hospital death. Code Status/Advance Care Planning:  DNR    Symptom Management:   As above  Palliative Prophylaxis:   Delirium Protocol  Additional Recommendations (Limitations, Scope, Preferences):  Full Comfort Care  Psycho-social/Spiritual:   Desire for further Chaplaincy support:yes  Additional Recommendations: Education on Hospice  Prognosis:   Hours - Days  Discharge Planning: Anticipated Hospital Death      Primary Diagnoses: Present on Admission: . Diverticulitis of sigmoid colon with abscess . CKD (chronic kidney disease), stage IV ([place]) . Hyperlipidemia   I have reviewed the medical record, interviewed the patient and family, and examined the patient. The following aspects are pertinent.  Past Medical History:  Diagnosis Date  . Abnormal blood findings    baseline cr 1.3, watching for renal insufficiency  . Allergic rhinitis   .  History of diverticulitis of colon   . Hyperglycemia 09   mild  . Hyperlipidemia   . Hypertension   . Stroke (cerebrum) [facility])    Social History   Socioeconomic History  . Marital status: Widowed    Spouse name: Not on file  . Number of children: Not on file  . Years of education: Not on file  . Highest education level: Not on file  Occupational History  . Occupation: unemployed  Tobacco Use  . Smoking status: Former Smoker    Quit date: 04/25/1978    Years since quitting: 42.0  . Smokeless tobacco: Never Used  Vaping Use  . Vaping Use: Never used  Substance and Sexual Activity  . Alcohol use: No    Alcohol/week: 0.0 standard drinks  . Drug use: No  . Sexual activity: Not Currently  Other Topics Concern  . Not on file  Social History Narrative   Widowed, not working, son lives close by   Regular exercise: walking videos 3-4 times weekly            Social Determinants of Health   Financial Resource Strain: Not on file  Food Insecurity: Not on file  Transportation Needs: Not on file  Physical Activity: Not on file  Stress: Not on file  Social Connections: Not on file   Family History  Problem Relation Age of Onset  . Arthritis Mother   . Arthritis Sister   . Lupus Sister        ?  . Diabetes Sister   . Prostate cancer Brother   . Stroke Father   . Hypertension Father        ?   Scheduled Meds: . Chlorhexidine Gluconate Cloth  6 each Topical Daily   Continuous Infusions: . sodium chloride 10 mL/hr at 09-May-2020 1243  . HYDROmorphone     PRN Meds:.antiseptic oral rinse, glycopyrrolate, glycopyrrolate **OR** glycopyrrolate **OR** glycopyrrolate, haloperidol **OR** haloperidol **OR** haloperidol lactate, HYDROmorphone **AND** HYDROmorphone, ondansetron **OR** ondansetron (ZOFRAN) IV, phenol, polyvinyl alcohol Medications Prior to Admission:  Prior to Admission medications   Medication Sig Start Date End Date Taking? Authorizing Provider  aspirin 81 MG tablet  Take 81 mg by mouth daily.   Yes [provider]  Calcium Carbonate-Vit D-Min (CALCIUM 1200 PO) Take 1 tablet by mouth daily.   Yes [provider]  hydrALAZINE (APRESOLINE) 50 MG tablet Take 50 mg by mouth in the morning and at bedtime. 11/08/19  Yes [provider]  losartan (COZAAR) 50 MG tablet Take 50 mg by mouth daily. 02/11/20  Yes [provider]  metoprolol succinate (TOPROL-XL) 50 MG 24 hr tablet TAKE 1 TABLET DAILY WITH OR IMMEDIATELY FOLLOWING A MEAL 10/18/19  Yes Tower, Marne A, MD  rosuvastatin (CRESTOR) 20 MG tablet TAKE 1 TABLET DAILY 12/05/19  Yes Tower, Wynelle Fanny, MD  amoxicillin-clavulanate (AUGMENTIN) 875-125 MG tablet Take 1 tablet by mouth 2 (two) times daily. Patient not taking: No sig reported 04/06/20   Venia Carbon, MD  Allergies  Allergen Reactions  . Amlodipine Swelling    Throat and mouth swelling  . Fluticasone Propionate     REACTION: does not help  . Loratadine     REACTION: does not work  . Lovastatin     REACTION: was not effective  . Omeprazole Swelling    Throat, lips, and mouth swelling  . Zocor [Simvastatin - High Dose]     Mouth and throat swelled   Review of Systems Moaning, does not verbal Physical Exam Frail elderly lady resting in bed, mild distress Shallow regular work of breathing Abdomen mildly distended S1-S2 Monitor noted Has some generalized edema  Vital Signs: BP (!) 95/38 (BP Location: Left Arm)   Pulse 69   Temp (!) 97.4 F (36.3 C) (Axillary)   Resp 12   Ht 5\' 2"  (1.575 m)   Wt 69.6 kg   SpO2 100%   BMI 28.06 kg/m  Pain Scale: CPOT POSS *See Group Information*: 1-Acceptable,Awake and alert Pain Score: Asleep   SpO2: SpO2: 100 % O2 Device:SpO2: 100 % O2 Flow Rate: .O2 Flow Rate (L/min): 4 L/min  IO: Intake/output summary:   Intake/Output Summary (Last 24 hours) at May 22, 2020 1248 Last data filed at 05/22/20 0618 Gross per 24 hour  Intake 1434.1 ml  Output 700 ml  Net  734.1 ml    LBM: Last BM Date: 05/22/20 Baseline Weight: Weight: 59.9 kg Most recent weight: Weight: 69.6 kg     Palliative Assessment/Data:   PPS 20%  Time In:  11.30 Time Out:  12.30 Time Total:  60  Greater than 50%  of this time was spent counseling and coordinating care related to the above assessment and plan.  Signed by: Loistine Chance, MD   Please contact Palliative Medicine Team phone at [phone] for questions and concerns.  For individual provider: See Shea Evans

## 2020-05-26 NOTE — Progress Notes (Signed)
SLP Cancellation Note  Patient Details Name: Martha Ortega MRN: [id number] DOB: [birth year]   Cancelled treatment:       Reason Eval/Treat Not Completed: Other (comment);Medical issues which prohibited therapy (pt now comfort care)  Kathleen Lime, MS [place] Office [phone] Pager [phone]   Macario Golds 2020/05/14, 8:09 AM

## 2020-05-26 NOTE — Discharge Summary (Signed)
Death Summary  IRIANA ARTLEY [id number]:[id number] DOB: 1935-03-24 DOA: May 11, 2020  PCP: [name], MD   Admit date: 05-11-20 Date of Death: May 28, 2020  Final Diagnoses:  Principal Problem:   Severe sepsis with septic shock ([place]) Active Problems:   Hyperlipidemia   CKD (chronic kidney disease), stage IV (HCC)   Diverticulitis of sigmoid colon with abscess   Aortic atherosclerosis ([place])   Blood in stool   ABLA (acute blood loss anemia)   Pericolonic abscess due to diverticulitis   Large bowel obstruction (H[facility])   Cerebrovascular accident (CVA) ([place])   Hypomagnesemia   Hypokalemia   Sepsis (HCC)   Postoperative hypovolemic shock   Pneumoperitoneum   Renal failure (ARF), acute on chronic (HCC)   Elevated troponin   Ureter injury   Pressure injury of skin     History of present illness:  86 year old female with history of hypertension, hyperlipidemia, glaucoma developed abdominal pain she was seen in the ED with CT scan showed descending colonic diverticulitis left sinus tract small gas collection, no overt perforation.  GI was consulted initially noted that she had incomplete colonoscopy in the past in 2007, attempted colonoscopy but significant tortuosity severe narrowing with high risk of perforation, bleeding anemia follow-up showed segmental narrowing.   Patient was seen by general surgery, conservative management was started however she was unable to take p.o. and NG tube was placed.  She was taken to the OR on 04/23/2020, she became hypotensive despite IV fluid boluses. 12/13 treated with Augmentin for 2 weeks for diverticulitis 05/12/23 admission for diverticulitis, refractory to therapy 12/29 failed medical therapy. Total abdominal colectomy with end ileostomy. Complicated by stomach injury and left ureter entrapment, repaired intraoperatively. 12/30 transferred to ICU for hypotension,nephrology and cardiology consulted 12/31 Central venous catheter insertion, TPN  initiation, started on amiodarone, Eraxis and vancomycin added to Zosyn, also started on pressors 1/1 off of pressors. 1/3 NG tube out, TPN started, Eraxis discontinued 1/4-Heparin started 1/6-patient son refused to get drain placed for intra-abdominal abscess overlying liver 1/6-patient made comfort care only, DNR  Hospital Course:  1. Severe sepsis/septic shock-secondary to refractory diverticulitis with underlying sigmoid stricture.  General surgery managing, NG tube was removed on 04/27/2020.  JP drain in place.  TPN started on 04/27/2020.   2. Colonic diverticulitis with left sinus tract-patient was earlier managed conservatively however she was unable to take p.o. and NG tube was placed.  She was taken to the OR on 03/26/2020.  Underwent total colectomy with end ileostomy. 3. Intra-abdominal abscess-CT abdomen showed collection over liver, IR was consulted to drain.  However patient son has refused to give consent for drainage.    Patient was made comfort care after discussion with patient's son. 4. AKI on CKD stage IV-entrapment of left ureter was ruled out, nephrology was consulted currently on Lasix 80 mg IV every 12 hours.  Creatinine is now elevated at 5.36.  No significant improvement.   5. Paroxysmal atrial fibrillation-patient developed postop atrial fibrillation, CHA2DS2VASc score more than 6.  Norepinephrine was discontinued on 04/25/2020.  She was started on IV amiodarone continue metoprolol 50 mg p.o. daily.  Amiodarone h was discontinued and patient made comfort care only.  6. Goals of care-called and discussed with patient's son and discussed patient's deteriorating condition with no significant improvement.  Patient earlier told nurse that she did not want to suffer, and patient's son agrees with her.  He has already refused consent for drainage of the intra-abdominal abscess.  Palliative care was consulted,  patient was transitioned to comfort measures only.  She was started on morphine  as needed and later switched to Dilaudid as needed for comfort.  Patient expired on May 07, 2020.    Time of death-1356  Signed:  Oswald Hillock  [facility] 05/07/2020, 2:51 PM

## 2020-05-26 NOTE — Consult Note (Signed)
Patient transitioned to comfort care; will note for staff to change ostomy pouch at least weekly and PRN leakage. No teaching needed.    Re consult if needed, will not follow at this time. Thanks  Casha Estupinan [employer], RN,CWOCN, CNS, [place] [phone])

## 2020-05-26 NOTE — Progress Notes (Signed)
[facility]  PROGRESS NOTE  Martha Ortega OJJ:[id number] DOB: Jan 14, 1935 DOA: 04/13/2020 PCP: [name], MD   Brief HPI:   86 year old female with history of hypertension, hyperlipidemia, glaucoma developed abdominal pain she was seen in the ED with CT scan showed descending colonic diverticulitis left sinus tract small gas collection, no overt perforation.  GI was consulted initially noted that she had incomplete colonoscopy in the past in 2007, attempted colonoscopy but significant tortuosity severe narrowing with high risk of perforation, bleeding anemia follow-up showed segmental narrowing.   Patient was seen by general surgery, conservative management was started however she was unable to take p.o. and NG tube was placed.  She was taken to the OR on 04/17/2020, she became hypotensive despite IV fluid boluses. 12/13 treated with Augmentin for 2 weeks for diverticulitis 12/21 admission for diverticulitis, refractory to therapy 12/29 failed medical therapy. Total abdominal colectomy with end ileostomy. Complicated by stomach injury and left ureter entrapment, repaired intraoperatively. 12/30 transferred to ICU for hypotension,nephrology and cardiology consulted 12/31 Central venous catheter insertion, TPN initiation, started on amiodarone, Eraxis and vancomycin added to Zosyn, also started on pressors 1/1 off of pressors. 1/3 NG tube out, TPN started, Eraxis discontinued 1/4-Heparin started, therapy recommending skilled   Subjective   Patient seen and examined, lethargic.  Initiated comfort measures after discussion with son.   Assessment/Plan:     1. Severe sepsis/septic shock-secondary to refractory diverticulitis with underlying sigmoid stricture.  General surgery managing, NG tube was removed on 04/27/2020.  JP drain in place.  TPN started on 04/27/2020.  Continue Zosyn.  WBC still elevated at 31,000. Blood cultures x2 are negative. 2. Colonic diverticulitis with left  sinus tract-patient was earlier managed conservatively however she was unable to take p.o. and NG tube was placed.  She was taken to the OR on 03/28/2020.  Underwent total colectomy with end ileostomy. 3. Intra-abdominal abscess-CT abdomen showed collection over liver, IR was consulted to drain.  However patient son has refused to give consent for drainage.  He is getting more towards comfort care rather than aggressive management at this time. 4. AKI on CKD stage IV-entrapment of left ureter was ruled out, nephrology was consulted currently on Lasix 80 mg IV every 12 hours.  Creatinine is now elevated at 5.36.  No significant improvement.   5. Paroxysmal atrial fibrillation-patient developed postop atrial fibrillation, CHA2DS2VASc score more than 6.  Norepinephrine was discontinued on 04/25/2020.  She was started on IV amiodarone continue metoprolol 50 mg p.o. daily.  Amiodarone has been discontinued as patient is under comfort measures at this time. 6. Hyperglycemia--continue sliding scale insulin with NovoLog.  CBGs fairly well controlled. 7. Goals of care-called and discussed with patient's son and discussed patient's deteriorating condition with no significant improvement.  Patient earlier told nurse that she did not want to suffer, and patient's son agrees with her.  He has already refused consent for drainage of the intra-abdominal abscess.  Palliative care was consulted, patient was transitioned to comfort measures only.    COVID-19 Labs  No results for input(s): DDIMER, FERRITIN, LDH, CRP in the last 72 hours.  Lab Results  Component Value Date   [place] NEGATIVE 03/28/2020     Scheduled medications:   . Chlorhexidine Gluconate Cloth  6 each Topical Daily         CBG: Recent Labs  Lab 04/29/20 1746 04/29/20 2343 04/30/20 0736 04/30/20 1202 04/30/20 1720  GLUCAP 194* 198* 224* 205* 171*    SpO2:  100 % O2 Flow Rate (L/min): 4 L/min FiO2 (%): (!) 1 %    CBC: Recent  Labs  Lab 04/25/20 0442 04/26/20 0420 04/27/20 0642 04/29/20 0638 04/30/20 0720  WBC 22.9* 22.7* 20.2* 24.2* 31.5*  NEUTROABS 20.3* 19.7* 16.7*  --   --   HGB 7.9* 7.8* 7.6* 7.8* 7.7*  HCT 23.3* 22.6* 21.8* 21.7* 22.5*  MCV 85.3 83.4 82.9 80.1 83.6  PLT 294 265 178 200 177    Basic Metabolic Panel: Recent Labs  Lab 04/26/20 0420 04/27/20 0642 04/28/20 0412 04/29/20 0638 04/30/20 0720 04/30/20 1457  NA 131* 134* 134* 136 134* 135  K 4.2 4.2 4.2 4.5 5.6* 5.7*  CL 94* 94* 96* 97* 98 98  CO2 24 24 24 23  21* 22  GLUCOSE 244* 173* 194* 215* 306* 183*  BUN 51* 65* 75* 84* 97* 101*  CREATININE 4.15* 4.74* 4.80* 4.80* 5.36* 5.34*  CALCIUM 7.6* 8.4* 7.8* 8.0* 8.3* 8.4*  MG 2.8* 2.7* 2.4 2.0 2.0  --   PHOS 3.8  3.8 3.6 4.0 4.1 6.1*  --      Liver Function Tests: Recent Labs  Lab 04/25/20 0442 04/26/20 0420 04/27/20 0642 04/28/20 0412  AST 57*  --  45* 35  ALT 23  --  24 22  ALKPHOS 83  --  84 79  BILITOT 0.8  --  0.9 0.6  PROT 4.9*  --  5.8* 5.1*  ALBUMIN 2.0*  2.0* 1.7* 2.8* 2.1*     Antibiotics: Anti-infectives (From admission, onward)   Start     Dose/Rate Route Frequency Ordered Stop   04/25/20 1000  anidulafungin (ERAXIS) 100 mg in sodium chloride 0.9 % 100 mL IVPB  Status:  Discontinued        100 mg 78 mL/hr over 100 Minutes Intravenous Every 24 hours 04/24/20 0956 04/27/20 1257   04/24/20 1130  vancomycin (VANCOREADY) IVPB 1500 mg/300 mL        1,500 mg 150 mL/hr over 120 Minutes Intravenous  Once 04/24/20 1038 04/24/20 1440   04/24/20 1100  anidulafungin (ERAXIS) 200 mg in sodium chloride 0.9 % 200 mL IVPB        200 mg 78 mL/hr over 200 Minutes Intravenous  Once 04/24/20 0958 04/24/20 1515   04/24/20 1046  vancomycin variable dose per unstable renal function (pharmacist dosing)  Status:  Discontinued         Does not apply See admin instructions 04/24/20 1046 04/26/20 1041   03/30/2020 1200  piperacillin-tazobactam (ZOSYN) IVPB 2.25 g  Status:   Discontinued        2.25 g 100 mL/hr over 30 Minutes Intravenous Every 8 hours 04/24/2020 1138 2020-05-08 1137       DVT prophylaxis: Heparin  Code Status: Full code  Family Communication: Spoke to patient's son on phone.   Consultants:  General surgery  Nephrology  Palliative care  Procedures:      Objective   Vitals:   04/30/20 1800 04/30/20 1900 04/30/20 2000 05-08-2020 0727  BP: (!) 90/30 (!) 94/31 (!) 95/23 (!) 95/38  Pulse:  70  69  Resp: 15 15 15 12   Temp:      TempSrc:      SpO2:  (!) 81%  100%  Weight:      Height:        Intake/Output Summary (Last 24 hours) at 08-May-2020 1256 Last data filed at 08-May-2020 0618 Gross per 24 hour  Intake 1434.1 ml  Output 700 ml  Net 734.1  ml    01/05 1901 - 01/07 0700 In: 2962.7 [I.V.:2812.7] Out: 1105 [Urine:950; Drains:45]  Filed Weights   04/26/20 0500 04/28/20 0500 04/29/20 0500  Weight: 71.4 kg 71.4 kg 69.6 kg    Physical Examination:    General-appears lethargic  Heart-S1-S2, regular, no murmur auscultated  Lungs-clear to auscultation bilaterally, no wheezing or crackles auscultated  Abdomen-soft, nontender, no organomegaly  Extremities-no edema in the lower extremities  Neuro-lethargic, barely opens eyes to verbal stimuli   Status is: Inpatient  Dispo: The patient is from: Home              Anticipated d/c is to: Skilled nursing facility              Anticipated d/c date is: 05/04/2020              Patient currently not medically stable for discharge  Barrier to discharge-ongoing treatment for refractory diverticulitis with underlying sigmoid stricture  Pressure Injury 04/29/20 Coccyx Medial Stage 1 -  Intact skin with non-blanchable redness of a localized area usually over a bony prominence. (Active)  04/29/20 0400  Location: Coccyx  Location Orientation: Medial  Staging: Stage 1 -  Intact skin with non-blanchable redness of a localized area usually over a bony prominence.  Wound  Description (Comments):   Present on Admission: No           Data Reviewed:   Recent Results (from the past 240 hour(s))  MRSA PCR Screening     Status: None   Collection Time: 04/23/2020  7:50 PM   Specimen: Nasopharyngeal  Result Value Ref Range Status   MRSA by PCR NEGATIVE NEGATIVE Final    Comment:        The GeneXpert MRSA Assay (FDA approved for NASAL specimens only), is one component of a comprehensive MRSA colonization surveillance program. It is not intended to diagnose MRSA infection nor to guide or monitor treatment for MRSA infections. Performed at [facility], [place] [address], [place], [place] [address]   Culture, Urine     Status: None   Collection Time: 04/03/2020  9:48 PM   Specimen: Urine, Catheterized  Result Value Ref Range Status   Specimen Description   Final    URINE, CATHETERIZED Performed at [facility] [address] [place], [place] [address]    Special Requests   Final    NONE Performed at [facility], [place] [address], [place], [place] [address]    Culture   Final    NO GROWTH Performed at [facility], [place] [address] [place], [place] [address]    Report Status 04/24/2020 FINAL  Final  Culture, blood (routine x 2)     Status: None   Collection Time: 04/02/2020 10:34 PM   Specimen: BLOOD  Result Value Ref Range Status   Specimen Description   Final    BLOOD BLOOD RIGHT ARM Performed at [facility] [address], [place], [place] [address]    Special Requests   Final    BOTTLES DRAWN AEROBIC AND ANAEROBIC Blood Culture adequate volume Performed at [facility] [address] [place], [place] [address]    Culture   Final    NO GROWTH 5 DAYS Performed at [facility], [place] [address], [place], [place] [address]    Report Status 04/28/2020 FINAL  Final  Culture, blood (routine x 2)     Status: None   Collection  Time:  04/13/2020 10:34 PM   Specimen: BLOOD  Result Value Ref Range Status   Specimen Description   Final    BLOOD BLOOD LEFT HAND Performed at [facility] [address], [place], [place] [address]    Special Requests   Final    BOTTLES DRAWN AEROBIC ONLY Blood Culture adequate volume Performed at [facility] [address] [place], [place] [address]    Culture   Final    NO GROWTH 5 DAYS Performed at [facility], [place] [address], [place], [place] [address]    Report Status 04/28/2020 FINAL  Final      Oswald Hillock   [facility] If 7PM-7AM, please contact night-coverage at [email or web address], Office  [phone]   May 05, 2020, 12:56 PM  LOS: 17 days

## 2020-05-26 NOTE — Progress Notes (Signed)
Physical Therapy Discharge Patient Details Name: Martha Ortega MRN: [id number] DOB: [birth year] Today's Date: 05/07/2020 Time:  -     Patient discharged from PT services secondary to medical decline - Patient is on comfort care measures.  Please see latest therapy progress note for current level of functioning and progress toward goals.      GP     Claretha Cooper 05/07/2020, 7:44 AM [place] Pager [phone] Office [phone]

## 2020-05-26 NOTE — Progress Notes (Signed)
Chaplain provided prayer over [place] before she passed.  Chaplain available to provide care to family as needed.    05-15-2020 1400  Clinical Encounter Type  Visited With Patient  Visit Type Death;Spiritual support

## 2020-05-26 NOTE — Progress Notes (Signed)
OT Cancellation Note  Patient Details Name: Martha Ortega MRN: [id number] DOB: 08-05-1934   Cancelled Treatment:    Reason Eval/Treat Not Completed: Patient declined, no reason specified. Patient discharged from OT services secondary to medical decline - Patient is on comfort care measures.  Please see latest therapy progress note for current level of functioning and progress toward goals  Shaquala Broeker L Makayah Pauli May 28, 2020, 8:42 AM

## 2020-05-26 NOTE — Progress Notes (Signed)
Time of Death 1356. Verified by Delight Ovens RN and Ruthton Sink RN. No lung or heart sounds auscultated. Dr. Darrick Meigs notified. Also notified son, Coy Saunas. Pt belongings left with pt in morgue.

## 2020-05-26 NOTE — Progress Notes (Signed)
Noted plans to transition to comfort care only. Patient resting peacefully this AM with her son at the bedside. Palliative consult pending. Recommend once daily dressing changes, ok to feed for comfort. Surgery will follow peripherally but we are certainly available if we can be of any assistance.   Norm Parcel, [facility] May 17, 2020, 9:27 AM Please see Amion for pager number during day hours 7:00am-4:30pm

## 2020-05-26 DEATH — deceased

## 2022-02-25 IMAGING — CT CT ABD-PELV W/O CM
2 of 4 series · 15 of 46 positions shown, 17 images · non-contrast
Comparison: 04/14/2020

CLINICAL DATA: LEFT lower quadrant pain since [REDACTED] 04/14/2020,
constipation, blood in stool, unintentional weight loss for a month,
suspected complicated diverticulitis

EXAM:
CT ABDOMEN AND PELVIS WITHOUT CONTRAST
TECHNIQUE: Multidetector CT imaging of the abdomen and pelvis was performed
following the standard protocol without IV contrast. Neither oral
nor intravenous contrast sagittal and coronal MPR images
reconstructed from axial data set. Were utilized.

[Series 2: axial st · axial · 0.81mm/px · z∈[+1010,+1425]mm · 12 of 95 slices shown, 14 images]
[im 6/95  soft-tissue]
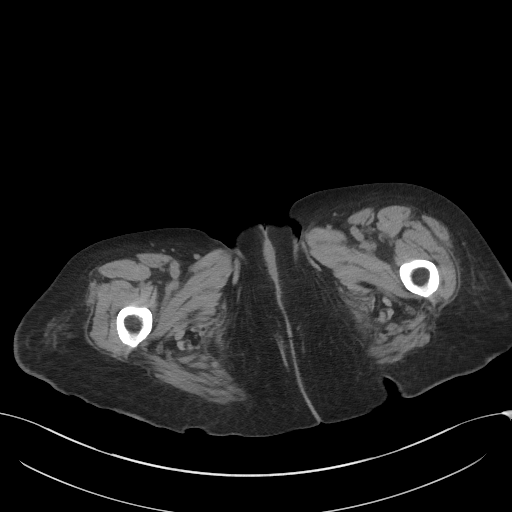
[im 6/95  bone]
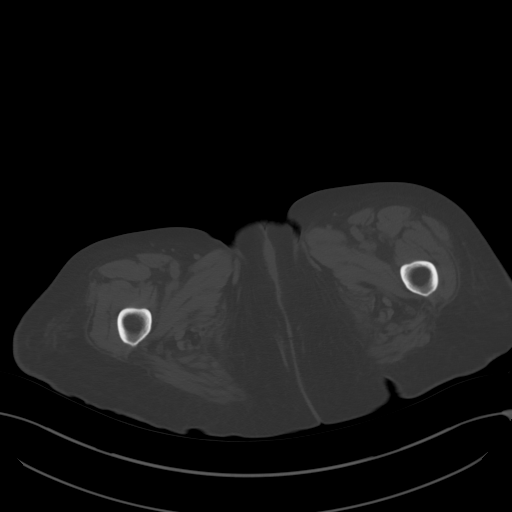
[im 17/95  soft-tissue]
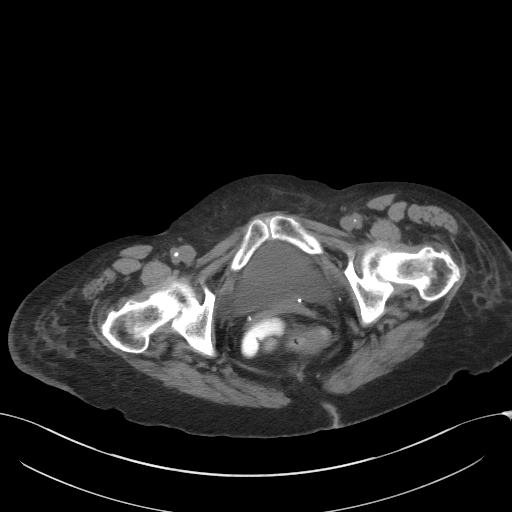
[im 23/95  soft-tissue]
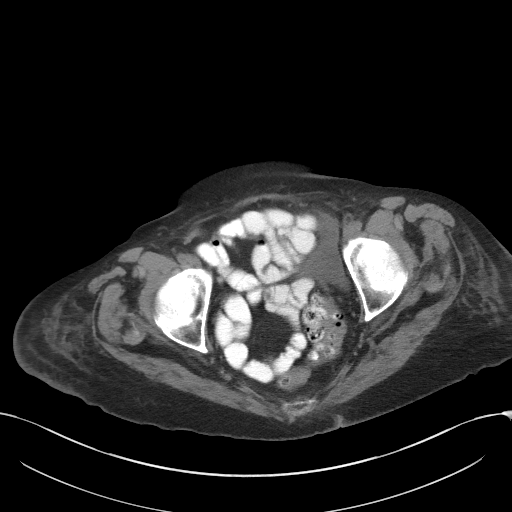
[im 28/95  soft-tissue]
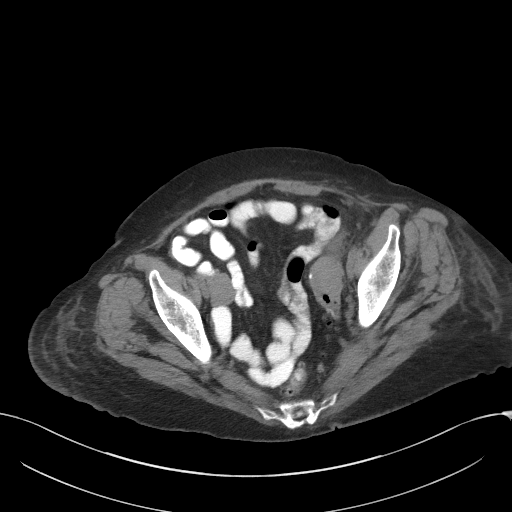
[im 39/95  soft-tissue]
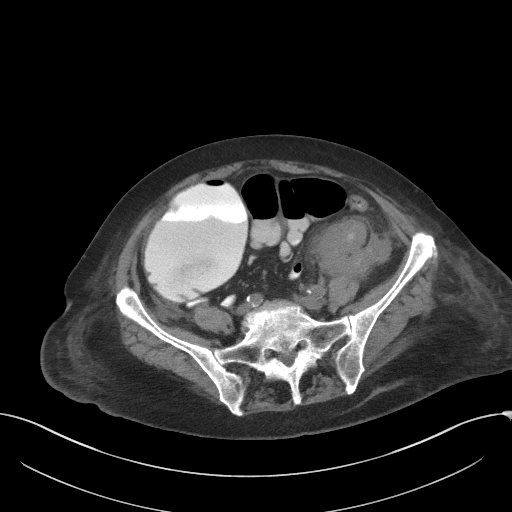
[im 45/95  soft-tissue]
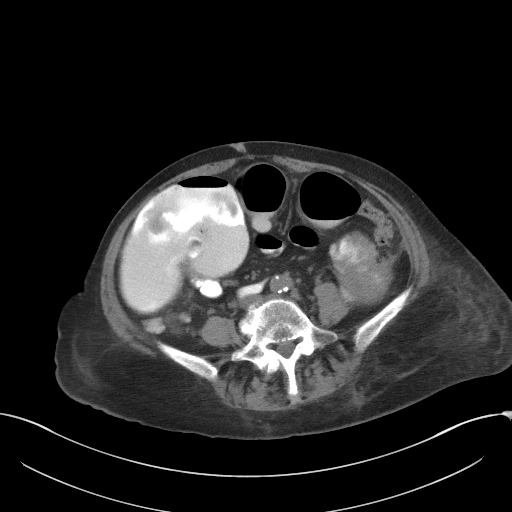
[im 50/95  soft-tissue]
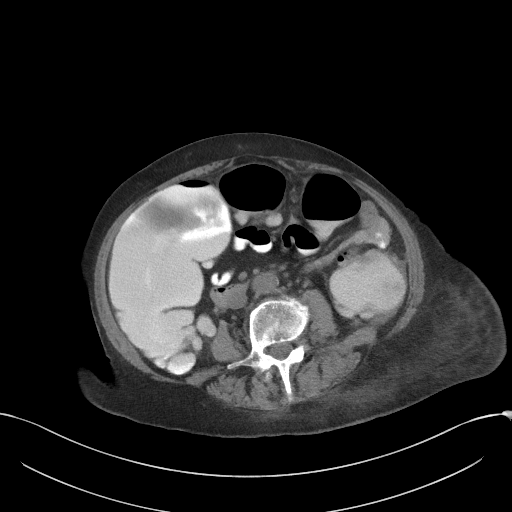
[im 61/95  soft-tissue]
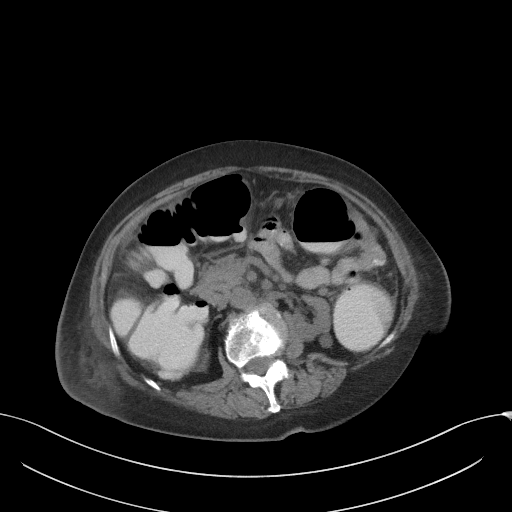
[im 67/95  soft-tissue]
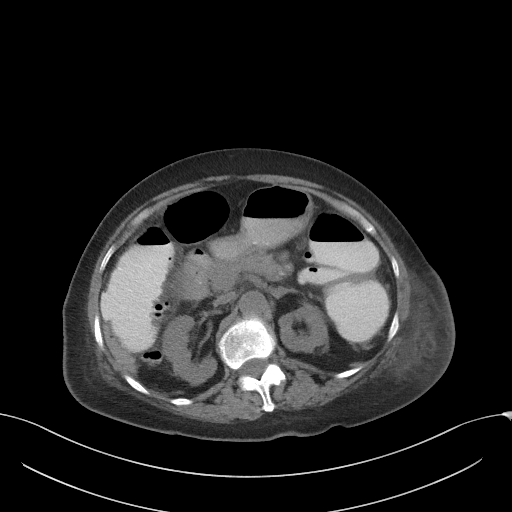
[im 67/95  bone]
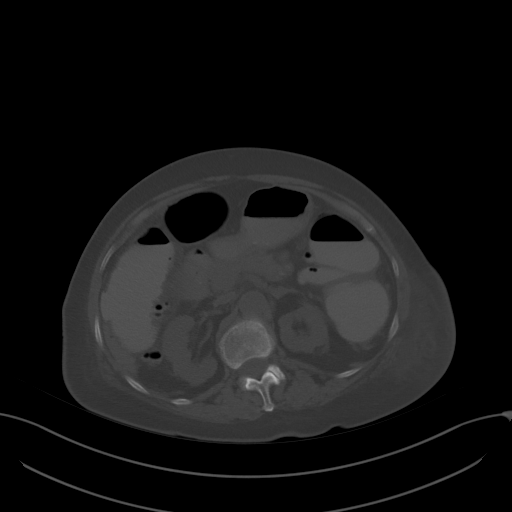
[im 72/95  soft-tissue]
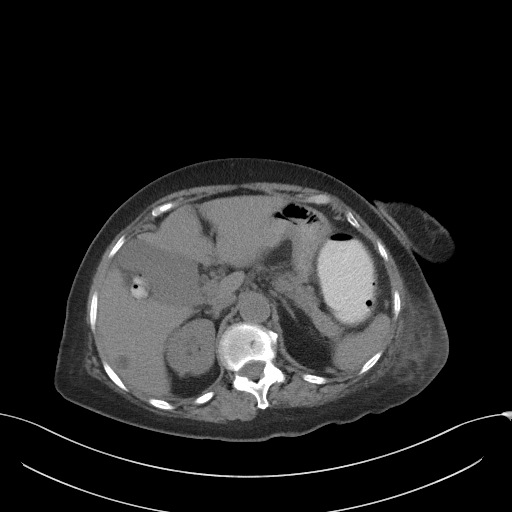
[im 83/95  soft-tissue]
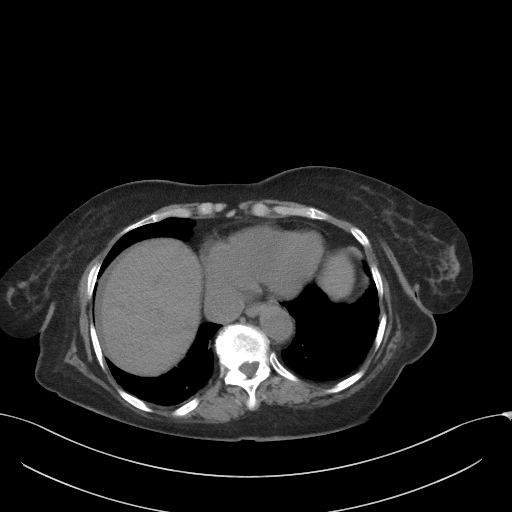
[im 89/95  soft-tissue]
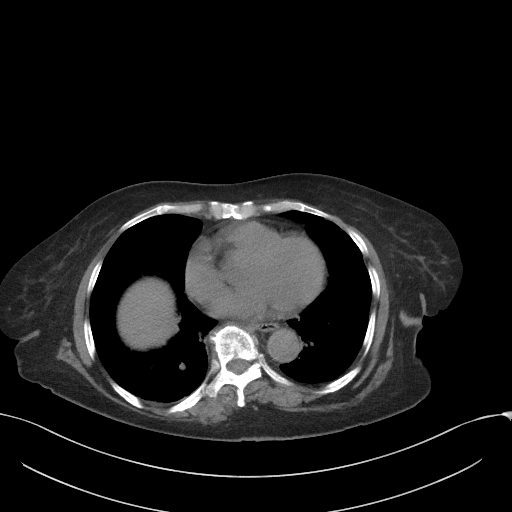

[Series 5: coronal st · coronal · 0.74mm/px · 3 of 87 slices shown]
[im 29/87  soft-tissue]
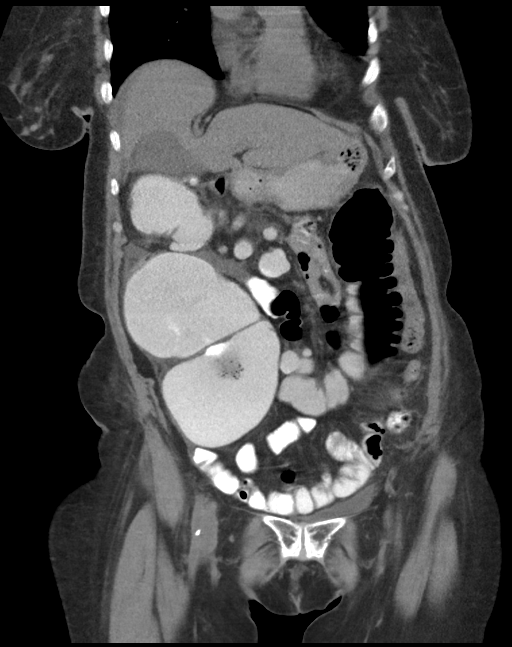
[im 39/87  soft-tissue]
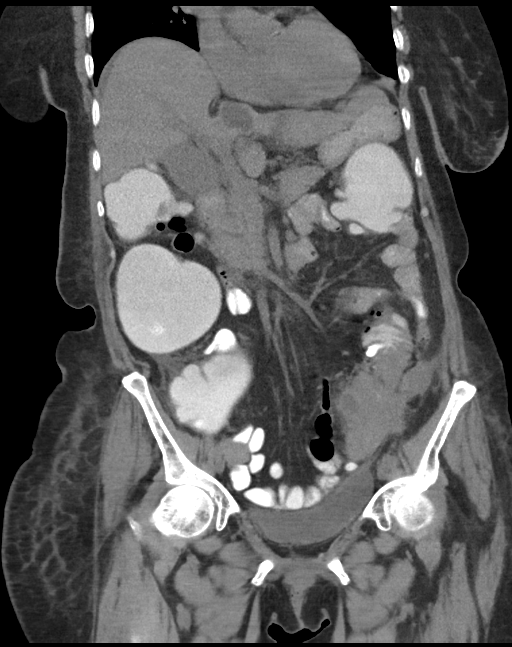
[im 48/87  soft-tissue]
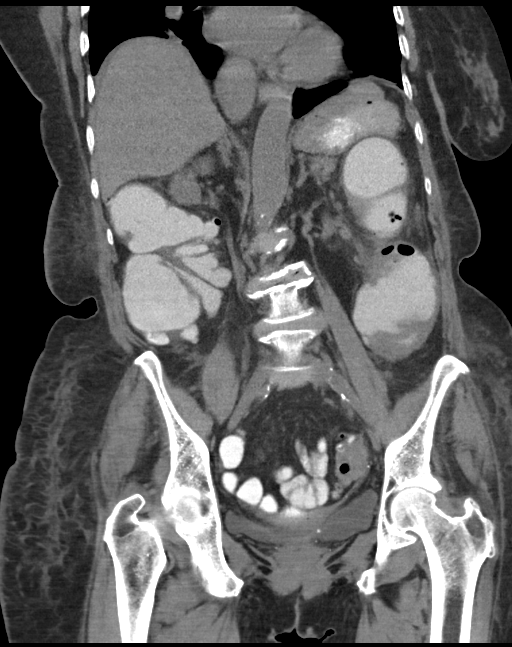

[15 of 46 positions shown; findings below may reference images not displayed]

FINDINGS: Lower chest: Subsegmental atelectasis LEFT lower lobe

Hepatobiliary: Several liver lesions are again identified, largest
3.0 x 2.5 cm posterior RIGHT lobe image 17 and superiorly lateral
segment LEFT lobe 2.9 x 2.2 cm image 17. These measure fluid
attenuation and likely represent cysts. Gallbladder distended
without calcification.

Pancreas: Normal appearance

Spleen: Normal appearance

Adrenals/Urinary Tract: Adrenal glands normal appearance. Small
exophytic cyst LEFT kidney 11 mm diameter. Kidneys, ureters, and
bladder otherwise normal appearance

Stomach/Bowel: Appendix not visualized. Stomach and small bowel
loops normal appearance. Dilated colon from cecum through distal
descending colon, containing contrast. Transition from dilated to
nondilated colon at the proximal sigmoid colon with marked wall
thickening and pericolic infiltrative changes likely representing
acute diverticulitis though requiring follow-up colonoscopy to
exclude underlying neoplasm. Numerous sigmoid diverticula. No
extraluminal gas or discrete abscess collection. At the site of gas
beyond the colon on the previous exam which appear to represent a
sinus tract, a small collection of extraluminal contrast is seen but
no air is identified.

Vascular/Lymphatic: Scattered pelvic phleboliths. Atherosclerotic
calcifications aorta and iliac arteries without aneurysm. No
adenopathy, few normal sized mesenteric nodes noted.

Reproductive: Uterus surgically absent. Nonvisualization of ovaries.

Other: Free fluid perihepatic, minimally perisplenic and both
pericolic gutters. No free air. No hernia.

Musculoskeletal: Osseous demineralization. Mild degenerative changes
and scoliosis of thoracolumbar spine.
IMPRESSION: Sigmoid diverticulosis with marked wall thickening and pericolic
infiltrative changes at the proximal sigmoid colon likely
representing acute diverticulitis.

Follow-up colonoscopy to exclude underlying colonic neoplasm.

Again identified short sinus tract extending from the site of
diverticulitis into the mesocolon, containing contrast but no air.

No evidence of abscess or free intraperitoneal perforation.

Small amount of ascites.

Hepatic and LEFT renal cysts.

Aortic Atherosclerosis ([id number]-[id number]).
